# Patient Record
Sex: Male | Born: 1950 | Race: White | Hispanic: No | Marital: Married | Smoking: Never smoker
Health system: Southern US, Community
[De-identification: ages and names within clinical notes are randomized; demographics above are authoritative.]

## PROBLEM LIST (undated history)

## (undated) DIAGNOSIS — Z5189 Encounter for other specified aftercare: Secondary | ICD-10-CM

## (undated) DIAGNOSIS — N200 Calculus of kidney: Secondary | ICD-10-CM

## (undated) DIAGNOSIS — I209 Angina pectoris, unspecified: Secondary | ICD-10-CM

## (undated) DIAGNOSIS — I2510 Atherosclerotic heart disease of native coronary artery without angina pectoris: Secondary | ICD-10-CM

## (undated) DIAGNOSIS — R42 Dizziness and giddiness: Secondary | ICD-10-CM

## (undated) DIAGNOSIS — G4733 Obstructive sleep apnea (adult) (pediatric): Principal | ICD-10-CM

## (undated) DIAGNOSIS — K219 Gastro-esophageal reflux disease without esophagitis: Secondary | ICD-10-CM

## (undated) DIAGNOSIS — Z9989 Dependence on other enabling machines and devices: Principal | ICD-10-CM

## (undated) DIAGNOSIS — E785 Hyperlipidemia, unspecified: Secondary | ICD-10-CM

## (undated) DIAGNOSIS — G4730 Sleep apnea, unspecified: Secondary | ICD-10-CM

## (undated) DIAGNOSIS — I10 Essential (primary) hypertension: Secondary | ICD-10-CM

## (undated) DIAGNOSIS — Z95810 Presence of automatic (implantable) cardiac defibrillator: Secondary | ICD-10-CM

## (undated) HISTORY — PX: CHOLECYSTECTOMY: SHX55

## (undated) HISTORY — PX: CORONARY ARTERY BYPASS GRAFT: SHX141

## (undated) HISTORY — DX: Hyperlipidemia, unspecified: E78.5

## (undated) HISTORY — DX: Dizziness and giddiness: R42

## (undated) HISTORY — DX: Calculus of kidney: N20.0

## (undated) HISTORY — DX: Obstructive sleep apnea (adult) (pediatric): G47.33

## (undated) HISTORY — DX: Encounter for other specified aftercare: Z51.89

## (undated) HISTORY — PX: COLONOSCOPY: SHX174

## (undated) HISTORY — DX: Dependence on other enabling machines and devices: Z99.89

## (undated) HISTORY — DX: Gastro-esophageal reflux disease without esophagitis: K21.9

## (undated) HISTORY — DX: Sleep apnea, unspecified: G47.30

## (undated) HISTORY — DX: Atherosclerotic heart disease of native coronary artery without angina pectoris: I25.10

---

## 1997-06-01 ENCOUNTER — Encounter: Admission: RE | Admit: 1997-06-01 | Discharge: 1997-06-01 | Payer: Self-pay | Admitting: Sports Medicine

## 1997-07-03 ENCOUNTER — Encounter: Admission: RE | Admit: 1997-07-03 | Discharge: 1997-07-03 | Payer: Self-pay | Admitting: Family Medicine

## 1998-06-18 ENCOUNTER — Encounter: Admission: RE | Admit: 1998-06-18 | Discharge: 1998-06-18 | Payer: Self-pay | Admitting: Family Medicine

## 1998-06-21 ENCOUNTER — Encounter: Admission: RE | Admit: 1998-06-21 | Discharge: 1998-06-21 | Payer: Self-pay | Admitting: Sports Medicine

## 1998-06-21 ENCOUNTER — Ambulatory Visit (HOSPITAL_COMMUNITY): Admission: RE | Admit: 1998-06-21 | Discharge: 1998-06-21 | Payer: Self-pay | Admitting: Sports Medicine

## 1998-06-24 ENCOUNTER — Observation Stay (HOSPITAL_COMMUNITY): Admission: AD | Admit: 1998-06-24 | Discharge: 1998-06-25 | Payer: Self-pay | Admitting: Interventional Cardiology

## 1998-07-01 ENCOUNTER — Ambulatory Visit (HOSPITAL_COMMUNITY): Admission: RE | Admit: 1998-07-01 | Discharge: 1998-07-01 | Payer: Self-pay | Admitting: Interventional Cardiology

## 1998-07-27 ENCOUNTER — Observation Stay (HOSPITAL_COMMUNITY): Admission: AD | Admit: 1998-07-27 | Discharge: 1998-07-29 | Payer: Self-pay | Admitting: Anesthesiology

## 1998-08-02 ENCOUNTER — Ambulatory Visit (HOSPITAL_COMMUNITY): Admission: RE | Admit: 1998-08-02 | Discharge: 1998-08-02 | Payer: Self-pay

## 1998-09-10 ENCOUNTER — Encounter: Admission: RE | Admit: 1998-09-10 | Discharge: 1998-09-10 | Payer: Self-pay | Admitting: Sports Medicine

## 1998-10-08 ENCOUNTER — Encounter: Admission: RE | Admit: 1998-10-08 | Discharge: 1998-10-08 | Payer: Self-pay | Admitting: Family Medicine

## 1998-11-11 ENCOUNTER — Encounter: Admission: RE | Admit: 1998-11-11 | Discharge: 1998-11-11 | Payer: Self-pay | Admitting: Family Medicine

## 1998-11-18 ENCOUNTER — Encounter: Admission: RE | Admit: 1998-11-18 | Discharge: 1998-11-18 | Payer: Self-pay | Admitting: Family Medicine

## 1998-12-23 ENCOUNTER — Encounter: Admission: RE | Admit: 1998-12-23 | Discharge: 1998-12-23 | Payer: Self-pay | Admitting: Family Medicine

## 1998-12-30 ENCOUNTER — Encounter: Admission: RE | Admit: 1998-12-30 | Discharge: 1998-12-30 | Payer: Self-pay | Admitting: Family Medicine

## 1999-01-13 ENCOUNTER — Encounter: Admission: RE | Admit: 1999-01-13 | Discharge: 1999-01-13 | Payer: Self-pay | Admitting: Family Medicine

## 1999-01-27 ENCOUNTER — Encounter: Admission: RE | Admit: 1999-01-27 | Discharge: 1999-01-27 | Payer: Self-pay | Admitting: Family Medicine

## 1999-02-03 ENCOUNTER — Encounter: Admission: RE | Admit: 1999-02-03 | Discharge: 1999-02-03 | Payer: Self-pay | Admitting: Sports Medicine

## 1999-02-17 ENCOUNTER — Encounter: Admission: RE | Admit: 1999-02-17 | Discharge: 1999-02-17 | Payer: Self-pay | Admitting: Family Medicine

## 1999-03-11 ENCOUNTER — Encounter: Admission: RE | Admit: 1999-03-11 | Discharge: 1999-03-11 | Payer: Self-pay | Admitting: Family Medicine

## 1999-03-31 ENCOUNTER — Encounter: Admission: RE | Admit: 1999-03-31 | Discharge: 1999-03-31 | Payer: Self-pay | Admitting: Family Medicine

## 2000-08-17 ENCOUNTER — Encounter: Admission: RE | Admit: 2000-08-17 | Discharge: 2000-08-17 | Payer: Self-pay | Admitting: Family Medicine

## 2000-09-25 ENCOUNTER — Encounter: Admission: RE | Admit: 2000-09-25 | Discharge: 2000-09-25 | Payer: Self-pay | Admitting: Sports Medicine

## 2000-09-29 ENCOUNTER — Inpatient Hospital Stay (HOSPITAL_COMMUNITY): Admission: RE | Admit: 2000-09-29 | Discharge: 2000-10-07 | Payer: Self-pay | Admitting: Interventional Cardiology

## 2000-09-29 ENCOUNTER — Encounter: Payer: Self-pay | Admitting: Cardiothoracic Surgery

## 2000-10-02 ENCOUNTER — Encounter: Payer: Self-pay | Admitting: Cardiothoracic Surgery

## 2000-10-03 ENCOUNTER — Encounter: Payer: Self-pay | Admitting: Cardiothoracic Surgery

## 2000-10-04 ENCOUNTER — Encounter: Payer: Self-pay | Admitting: Cardiothoracic Surgery

## 2000-10-05 ENCOUNTER — Encounter: Payer: Self-pay | Admitting: Cardiothoracic Surgery

## 2002-04-22 ENCOUNTER — Encounter: Admission: RE | Admit: 2002-04-22 | Discharge: 2002-04-22 | Payer: Self-pay | Admitting: Sports Medicine

## 2002-05-01 ENCOUNTER — Encounter: Admission: RE | Admit: 2002-05-01 | Discharge: 2002-05-01 | Payer: Self-pay | Admitting: Sports Medicine

## 2002-05-01 ENCOUNTER — Encounter: Payer: Self-pay | Admitting: Sports Medicine

## 2002-05-06 ENCOUNTER — Encounter: Admission: RE | Admit: 2002-05-06 | Discharge: 2002-05-06 | Payer: Self-pay | Admitting: Sports Medicine

## 2006-03-08 DIAGNOSIS — J309 Allergic rhinitis, unspecified: Secondary | ICD-10-CM | POA: Insufficient documentation

## 2006-03-08 DIAGNOSIS — I10 Essential (primary) hypertension: Secondary | ICD-10-CM

## 2006-03-08 DIAGNOSIS — L2089 Other atopic dermatitis: Secondary | ICD-10-CM

## 2006-03-08 DIAGNOSIS — K210 Gastro-esophageal reflux disease with esophagitis: Secondary | ICD-10-CM

## 2006-03-08 DIAGNOSIS — I25709 Atherosclerosis of coronary artery bypass graft(s), unspecified, with unspecified angina pectoris: Secondary | ICD-10-CM | POA: Insufficient documentation

## 2006-08-06 ENCOUNTER — Ambulatory Visit: Payer: Self-pay | Admitting: Gastroenterology

## 2006-08-08 ENCOUNTER — Encounter: Payer: Self-pay | Admitting: Sports Medicine

## 2006-09-21 ENCOUNTER — Ambulatory Visit: Payer: Self-pay | Admitting: Gastroenterology

## 2007-04-23 DIAGNOSIS — E785 Hyperlipidemia, unspecified: Secondary | ICD-10-CM

## 2007-07-22 ENCOUNTER — Encounter: Payer: Self-pay | Admitting: Sports Medicine

## 2007-07-23 ENCOUNTER — Ambulatory Visit: Payer: Self-pay | Admitting: Gastroenterology

## 2007-08-01 ENCOUNTER — Encounter: Payer: Self-pay | Admitting: Sports Medicine

## 2007-08-06 ENCOUNTER — Ambulatory Visit: Payer: Self-pay | Admitting: Gastroenterology

## 2009-05-19 ENCOUNTER — Encounter: Payer: Self-pay | Admitting: Physician Assistant

## 2009-06-10 ENCOUNTER — Ambulatory Visit (HOSPITAL_COMMUNITY): Admission: RE | Admit: 2009-06-10 | Discharge: 2009-06-11 | Payer: Self-pay | Admitting: Interventional Cardiology

## 2009-10-14 ENCOUNTER — Ambulatory Visit: Payer: Self-pay | Admitting: Sports Medicine

## 2009-10-15 ENCOUNTER — Encounter (INDEPENDENT_AMBULATORY_CARE_PROVIDER_SITE_OTHER): Payer: Self-pay | Admitting: *Deleted

## 2009-10-21 ENCOUNTER — Telehealth: Payer: Self-pay | Admitting: Gastroenterology

## 2009-10-21 ENCOUNTER — Encounter (INDEPENDENT_AMBULATORY_CARE_PROVIDER_SITE_OTHER): Payer: Self-pay | Admitting: *Deleted

## 2009-10-25 ENCOUNTER — Ambulatory Visit: Payer: Self-pay | Admitting: Gastroenterology

## 2009-10-25 DIAGNOSIS — K8020 Calculus of gallbladder without cholecystitis without obstruction: Secondary | ICD-10-CM | POA: Insufficient documentation

## 2009-10-25 DIAGNOSIS — K219 Gastro-esophageal reflux disease without esophagitis: Secondary | ICD-10-CM

## 2009-10-25 DIAGNOSIS — R1013 Epigastric pain: Secondary | ICD-10-CM

## 2009-11-08 ENCOUNTER — Ambulatory Visit (HOSPITAL_COMMUNITY): Admission: RE | Admit: 2009-11-08 | Discharge: 2009-11-08 | Payer: Self-pay | Admitting: Gastroenterology

## 2009-11-11 ENCOUNTER — Telehealth (INDEPENDENT_AMBULATORY_CARE_PROVIDER_SITE_OTHER): Payer: Self-pay | Admitting: *Deleted

## 2009-11-11 ENCOUNTER — Encounter: Payer: Self-pay | Admitting: Gastroenterology

## 2009-11-30 ENCOUNTER — Ambulatory Visit (HOSPITAL_COMMUNITY): Admission: RE | Admit: 2009-11-30 | Discharge: 2009-11-30 | Payer: Self-pay | Admitting: General Surgery

## 2009-12-14 ENCOUNTER — Encounter: Payer: Self-pay | Admitting: Gastroenterology

## 2009-12-23 ENCOUNTER — Telehealth: Payer: Self-pay | Admitting: Gastroenterology

## 2010-01-10 ENCOUNTER — Inpatient Hospital Stay (HOSPITAL_COMMUNITY)
Admission: EM | Admit: 2010-01-10 | Discharge: 2010-01-12 | Payer: Self-pay | Source: Home / Self Care | Attending: Interventional Cardiology | Admitting: Interventional Cardiology

## 2010-01-12 LAB — BASIC METABOLIC PANEL
BUN: 13 mg/dL (ref 6–23)
CO2: 27 mEq/L (ref 19–32)
Calcium: 8.8 mg/dL (ref 8.4–10.5)
Chloride: 107 mEq/L (ref 96–112)
Creatinine, Ser: 0.88 mg/dL (ref 0.4–1.5)
GFR calc Af Amer: >60 mL/min (ref >60)
GFR calc non Af Amer: >60 mL/min (ref >60)
Glucose, Bld: 111 mg/dL — ABNORMAL HIGH (ref 70–99)
Potassium: 3.7 mEq/L (ref 3.5–5.1)
Sodium: 140 mEq/L (ref 135–145)

## 2010-01-12 LAB — CBC
HCT: 40.5 % (ref 39.0–52.0)
Hemoglobin: 13.8 g/dL (ref 13.0–17.0)
MCH: 30.1 pg (ref 26.0–34.0)
MCHC: 34.1 g/dL (ref 30.0–36.0)
MCV: 88.4 fL (ref 78.0–100.0)
Platelets: 286 10*3/uL (ref 150–400)
RBC: 4.58 MIL/uL (ref 4.22–5.81)
RDW: 12.4 % (ref 11.5–15.5)
WBC: 8 10*3/uL (ref 4.0–10.5)

## 2010-01-30 ENCOUNTER — Encounter: Payer: Self-pay | Admitting: Family Medicine

## 2010-02-08 NOTE — Miscellaneous (Signed)
Summary: Orders -[facility] Referral  Clinical Lists Changes  Orders: Added new Test order of [facility] ([facility]) - Signed

## 2010-02-08 NOTE — Progress Notes (Signed)
Summary: Appt with [facility]  Phone Note Outgoing Call   Call placed by: Joselyn Glassman,  November 11, 2009 8:56 AM Call placed to: Specialist Summary of Call: Spoke to [place] and made appt for pt with Dr. Bertram Savin on 11-22-09. Faxed records from [place], Office notes, Hida scan, insurance cards , Ultra sound from [facility], [place], [place].  Pt informed of the appt.  Initial call taken by: Joselyn Glassman,  November 11, 2009 8:58 AM

## 2010-02-08 NOTE — Miscellaneous (Signed)
Summary: ETT appt  Clinical Lists Changes  Orders: Added new Test order of ETT (ETT) - Signed     Appt scheduled 11/16/09 @ [facility] @ 1pm. Appt info mailed to pt.  Rochele Pages RN  October 15, 2009 12:27 PM

## 2010-02-08 NOTE — Assessment & Plan Note (Signed)
Summary: F/U,[facility]   Vital Signs:  Patient profile:   60 year old male Height:      73 inches Weight:      170 pounds BMI:     22.51 BP sitting:   147 / 91  Vitals Entered By: Lillia Pauls CMA (October 14, 2009 10:40 AM)  History of Present Illness: June 2 [place] had repeat stenting of 2 areas that appeared to be successful  first CAD Tx in 1999 with stents Had CABG in 2002  Has run successfully since 2002 surgery  Last year 4 half marathons and full [place] marathon 40 to 47 MPW training Last ran half marathon first of May before stents on June 2  Later this May had global feeling that things weren't right This is the way his CAD has presented more than specific chest pain No classic angina sxs that led to cath on June 2  after Cath started back running June 5 after 2 weeks from stents started at 30 to 40 mpw slow runs now trains more slowly  Now has done 3 to 4 runs more than 2 hours HR will increase 5 hours after these to 110 to 115 range Hydrates well  No real abnormal SOB during running and with slow running really feels nothing No chest pain with running no leg cramps  [place] cause HR to jump to 150  Now with running 10 mins miles HR runs in 130s  Meds: Has only taken NTG twice - once in 2002 and once this past june  Allergies (verified): No Known Drug Allergies  Past History:  Past Medical History: lt ITB, lt morton`s neuroma and metatarsalgia  Uses Statins for hyperlipidemia Last panel 2011 TC 171 TG 131  HDL 50 LDL 96  ratio is 3.4  Physical Exam  General:  Well-developed,well-nourished,in no acute distress; alert,appropriate and cooperative throughout examination Neck:  No deformities, masses, or tenderness noted.  no bruits Lungs:  Normal respiratory effort, chest expands symmetrically. Lungs are clear to auscultation, no crackles or wheezes. Heart:  Normal rate and regular rhythm. S1 and S2 normal without gallop, murmur, click, rub or other  extra sounds.  rate 60 Extremities:  no edema   Impression & Recommendations:  Problem # 1:  CORONARY, ARTERIOSCLEROSIS (ICD-414.00) we need a funcitonal plan to discuss dealing with his exercise within safe guidelines  D/W dr Garnette Scheuermann I will do a running ETT for 30 mins at mod effort and check for avg HR and any signs of ischemia or arrythmia If this goes well we will allow training to level estimated by testing  We both suspect EF is better post stent and this will be a good fxnal estimate  Problem # 2:  HYPERLIPIDEMIA (ICD-272.4) Will leave on low dose statin  However will discuss Pritikin and Ornish diet plans as an option since he has so many side-effect issues with statins

## 2010-02-08 NOTE — Assessment & Plan Note (Signed)
Summary: abd pain, gallstones  on [place] /pl             Tommy [name]   History of Present Illness Visit Type: Initial Visit Primary GI MD: Tommy Bunting MD Primary Provider: Enid Baas, MD Chief Complaint: abdominal pain that radiate to bcak , gallstones x 2 months History of Present Illness:   VERY NICE 60 YO MALE KNOWN TO DR. Christella [name] FROM PRIOR COLONOSCOPY 7/09 -NORMAL. [name] WITH HX OF CAD, HE IS S/P CABG AND HAS MULTIPLE STENTS-MOST RECENT 6/11 AT WHICH TIME PLAVIX STARTED. ALSO WITH HX URETEROLITHIASIS. HE HAD ACUTE LEFT SIDED ABDOMINAL PAIN IN 3/11 WHILE ON VACATION IN [place] HAD AN ULTRASOUND  WHICH APPARENTLY ALSO SHOWED GALLSTONES. Marland Kitchen HE REPORTS HAVING EPIGASTRIC DISCOMFORT SINCE HE HAD THE STENTS IN [place]. THIS HAS BEEN INTERMITTENT, MILD, AND RADIATES BETWEEN HIS SHOULDER BLADES. NO N/V. APPETITE FINE, NO WORSE WITH by mouth INTAKE. SOME EPISODES OF GAS , BLOATING,WITH BURNING, AND PRESSURE LIKE PAIN. HE HAS CHRONIC HEARTBURN SXS-SAYS THIS IS DIFFERENT. HE HAD BEEN ON PRILOSEC LONG TERM,WAS SWITCHED TO PROTONIX  WHEN PUT ON PLAVIX-SAYS IT DOESN'T WORK AS WELL.  HE ALSO HAD AN US DONE AT HIS UROLOGIST  WITHIN THE PAST FEW MONTHS AND GALLSTONES MENTIONED AGAIN. HE  THINKS HIS CURRENT PAIN MAY BE RELATED.   GI Review of Systems    Reports abdominal pain, acid reflux, bloating, and  chest pain.     Location of  Abdominal pain: epigastric area.    Denies belching, dysphagia with liquids, dysphagia with solids, heartburn, loss of appetite, nausea, vomiting, vomiting blood, weight loss, and  weight gain.        Denies anal fissure, black tarry stools, change in bowel habit, constipation, diarrhea, diverticulosis, fecal incontinence, heme positive stool, hemorrhoids, irritable bowel syndrome, jaundice, light color stool, liver problems, rectal bleeding, and  rectal pain. Preventive Screening-Counseling & Management  Alcohol-Tobacco     Smoking Status: never      Drug Use:  no.     Current  Medications (verified): 1)  Pantoprazole Sodium 40 Mg Tbec (Pantoprazole Sodium) .... Once Daily 2)  Aspirin 325 Mg Tabs (Aspirin) .... Once Daily 3)  Metoprolol Tartrate 25 Mg Tabs (Metoprolol Tartrate) .... Once Daily 4)  Plavix 75 Mg Tabs (Clopidogrel Bisulfate) .... Once Daily 5)  Lipitor 10 Mg Tabs (Atorvastatin Calcium) .... Take 1/2 Tablet By Mouth Daily 6)  Nitrostat 0.4 Mg Subl (Nitroglycerin) .... As Needed  Allergies (verified): No Known Drug Allergies  Past History:  Past Medical History: morton`s neuroma and metatarsalgia CORONARY DISEASE-S/P CABG, S/P MULTIPLE STENTS-MOST RECENT 6/11(Tommy [name]) Uses Statins for hyperlipidemia Last panel 2011 TC 171 TG 131  HDL 50 LDL 96  ratio is 3.4 Kidney Stones  Past Surgical History: ETT - 06/10/1998, lt ITB injection - 10/09/1996, PTCA & stent - 06/10/1998 CABG-10/02 COLONOSCOPY 7/09 NORMAL   Family History: Family History of Heart Disease:  FATHER WITH COLON CANCER  Social History: patient is a Administrator, sports runner; non smoker/ minimal etoh Occupation:  Patient has never smoked.  Alcohol Use - no Daily Caffeine Use Illicit Drug Use - no Smoking Status:  never Drug Use:  no  Review of Systems  The patient denies allergy/sinus, anemia, anxiety-new, arthritis/joint pain, back pain, blood in urine, breast changes/lumps, change in vision, confusion, cough, coughing up blood, depression-new, fainting, fatigue, fever, headaches-new, hearing problems, heart murmur, heart rhythm changes, itching, menstrual pain, muscle pains/cramps, night sweats, nosebleeds, pregnancy symptoms, shortness of breath, skin rash, sleeping  problems, sore throat, swelling of feet/legs, swollen lymph glands, thirst - excessive , urination - excessive , urination changes/pain, urine leakage, vision changes, and voice change.         SEE HPI  Vital Signs:  Patient profile:   60 year old male Height:      74 inches Weight:      174.38  pounds BMI:     22.47 Pulse rate:   76 / minute Pulse rhythm:   regular BP sitting:   106 / 78  (left arm) Cuff size:   regular  Vitals Entered By: June McMurray CMA [name]) (October 25, 2009 1:25 PM)  Physical Exam  General:  Well developed, well nourished, no acute distress.,THIN Head:  Normocephalic and atraumatic. Eyes:  PERRLA, no icterus. Lungs:  Clear throughout to auscultation. Heart:  Regular rate and rhythm; no murmurs, rubs,  or bruits. Abdomen:  SOFT, NONTENDER, NO MASS OR HSM,BS+ Rectal:  NOT DONE Extremities:  No clubbing, cyanosis, edema or deformities noted. Neurologic:  Alert and  oriented x4;  grossly normal neurologically. Psych:  Alert and cooperative. Normal mood and affect.   Impression & Recommendations:  Problem # 1:  EPIGASTRIC PAIN (ICD-789.06) Assessment New 60 YO MALE WITH CAD. NEW C/O INTERMITTENT EPIGASTRIC PAIN RADIATING TO SHOULDER BLADES X 4 MONTHS. R/O SYMPTOMATIC CHOLELITHIASIS, R/O GASTRITIS/DYSPEPSIA ASSOCIATED WITH PLAVIX AND CHANGE OF PPI. CONTINUE PROTONIX 40 MG DAILY IN AM, ADVISED TO TAKE HIS PLAVIX IN THE EVENING  ADD TRIAL OF OTC PEPCID IN PM SCHEDULE FOR [facility] HIDA SCAN IF HIDA NEGATIVE, AND PAIN PERSISTING-THEN WILL NEED EGD WITH DR. Christella [name] Orders: HIDA CCK (HIDA CCK)  Problem # 2:  GERD (ICD-530.81) Assessment: Comment Only CHRONIC -SEE ABOVE  Problem # 3:  FAMILY HX COLON CANCER (ICD-V16.0) Assessment: Comment Only NORMAL COLON 2009- FOLLOW UP 2014  Problem # 4:  CORONARY, ARTERIOSCLEROSIS (ICD-414.00) Assessment: Comment Only S/P CABG,S/P 9 STENTS-ON PLAVIX  Patient Instructions: 1)  We schedueld the [facility] Hida scan on 11-08-09. 2)  Directions provided. 3)  Copy sent to : Tommy Baas, MD 4)  The medication list was reviewed and reconciled.  All changed / newly prescribed medications were explained.  A complete medication list was provided to the patient / caregiver.

## 2010-02-08 NOTE — Progress Notes (Signed)
Summary: triage  Phone Note Call from Patient Call back at [phone]  (cell)   Caller: Patient Call For: Dr. Christella Hartigan Reason for Call: Talk to Nurse Summary of Call: pt recently had [place] via urologist for kidney stones... was told by urology that he had gall stones as well and needs to follow up with his GI... pt says he is experiencing discomfort from gall stones... happy to bring [place] report from urologist for Dr. Larae Grooms review... ok to see Amy or Gunnar Fusi if necessary Initial call taken by: Vallarie Mare,  October 21, 2009 12:33 PM  Follow-up for Phone Call        pt scheduled with [facility] 10/25/09 at 130 pm Patty Lewis CMA (AAMA)  October 21, 2009 1:50 PM

## 2010-02-08 NOTE — Letter (Signed)
Summary: New Patient letter  [facility]  [address] [place], [place] [address]   Phone: [phone]  Fax: [phone]       10/21/2009 MRN: [id number]  Tommy [name] [address] [place], [place]  [address]  Dear Tommy [name],  Welcome to the Gastroenterology Division at [facility].    You are scheduled to see Tommy Gip PA on 10/25/09  at 1:30 pm  on the 3rd floor at [facility], [address].  We ask that you try to arrive at our office 15 minutes prior to your appointment time to allow for check-in.  We would like you to complete the enclosed self-administered evaluation form prior to your visit and bring it with you on the day of your appointment.  We will review it with you.  Also, please bring a complete list of all your medications or, if you prefer, bring the medication bottles and we will list them.  Please bring your insurance card so that we may make a copy of it.  If your insurance requires a referral to see a specialist, please bring your referral form from your primary care physician.  Co-payments are due at the time of your visit and may be paid by cash, check or credit card.     Your office visit will consist of a consult with your physician (includes a physical exam), any laboratory testing he/she may order, scheduling of any necessary diagnostic testing (e.g. x-ray, ultrasound, CT-scan), and scheduling of a procedure (e.g. Endoscopy, Colonoscopy) if required.  Please allow enough time on your schedule to allow for any/all of these possibilities.    If you cannot keep your appointment, please call [phone] to cancel or reschedule prior to your appointment date.  This allows [place] the opportunity to schedule an appointment for another patient in need of care.  If you do not cancel or reschedule by 5 p.m. the business day prior to your appointment date, you will be charged a $50.00 late cancellation/no-show fee.    Thank you for  choosing [facility] for your medical needs.  We appreciate the opportunity to care for you.  Please visit [place] at our website  to learn more about our practice.                     Sincerely,                                                             The Gastroenterology Division   Appended Document: New Patient letter letter mailed

## 2010-02-10 NOTE — Progress Notes (Signed)
Summary: Appts  Phone Note Call from Patient Call back at [phone]   Caller: Patient Call For: Mike Gip Reason for Call: Talk to Nurse Summary of Call: Pt had gallbladder removed but is still having problems, is requesting to see Amy again Initial call taken by: [place] Johnson,  December 23, 2009 3:05 PM  Follow-up for Phone Call        left message on machine to call back Chales Abrahams CMA [name])  December 24, 2009 1:57 PM   Additional Follow-up for Phone Call Additional follow up Details #1::        pt had gallbladder surgery on 12/01/09 since surgery he is having alot of heartburn and gas.  Had f/u with Dr Freida Busman first of Dec and she advised to call Dr Christella Hartigan office.  He was taking prilosec OTC and it helped Dr Katrinka Blazing (cardiologist) changed him to pantoprazole because of adding Plavix.  He wants to switch back to Prilosec otc is this ok? Additional Follow-up by: Chales Abrahams CMA [name]),  December 24, 2009 2:08 PM    Additional Follow-up for Phone Call Additional follow up Details #2::    yes, that is ok Follow-up by: Rachael Fee MD,  December 24, 2009 2:22 PM  Additional Follow-up for Phone Call Additional follow up Details #3:: Details for Additional Follow-up Action Taken: pt aware he will call with further complaints Additional Follow-up by: Chales Abrahams CMA [name]),  December 24, 2009 2:25 PM

## 2010-02-10 NOTE — Letter (Signed)
Summary: [facility]   Imported By: Sherian Rein 01/26/2010 14:52:41  _____________________________________________________________________  External Attachment:    Type:   Image     Comment:   External Document

## 2010-03-21 LAB — CBC
HCT: 37.9 % — ABNORMAL LOW (ref 39.0–52.0)
HCT: 42.6 % (ref 39.0–52.0)
Hemoglobin: 13.1 g/dL (ref 13.0–17.0)
Hemoglobin: 14.8 g/dL (ref 13.0–17.0)
MCH: 30.2 pg (ref 26.0–34.0)
MCH: 30.4 pg (ref 26.0–34.0)
MCHC: 34.6 g/dL (ref 30.0–36.0)
MCHC: 34.7 g/dL (ref 30.0–36.0)
MCV: 87.3 fL (ref 78.0–100.0)
MCV: 87.5 fL (ref 78.0–100.0)
Platelets: 274 10*3/uL (ref 150–400)
Platelets: 302 10*3/uL (ref 150–400)
RBC: 4.34 MIL/uL (ref 4.22–5.81)
RBC: 4.87 MIL/uL (ref 4.22–5.81)
RDW: 12.2 % (ref 11.5–15.5)
RDW: 12.3 % (ref 11.5–15.5)
WBC: 6.6 10*3/uL (ref 4.0–10.5)
WBC: 7 10*3/uL (ref 4.0–10.5)

## 2010-03-21 LAB — POCT CARDIAC MARKERS
CKMB, poc: <1 ng/mL — ABNORMAL LOW (ref 1.0–8.0)
Myoglobin, poc: 67.4 ng/mL (ref 12–200)
Troponin i, poc: <0.05 ng/mL (ref 0.00–0.09)

## 2010-03-21 LAB — DIFFERENTIAL
Basophils Absolute: 0 10*3/uL (ref 0.0–0.1)
Basophils Relative: 1 % (ref 0–1)
Eosinophils Absolute: 0.1 10*3/uL (ref 0.0–0.7)
Eosinophils Relative: 1 % (ref 0–5)
Lymphocytes Relative: 24 % (ref 12–46)
Lymphs Abs: 1.7 10*3/uL (ref 0.7–4.0)
Monocytes Absolute: 0.6 10*3/uL (ref 0.1–1.0)
Monocytes Relative: 8 % (ref 3–12)
Neutro Abs: 4.7 10*3/uL (ref 1.7–7.7)
Neutrophils Relative %: 66 % (ref 43–77)

## 2010-03-21 LAB — CARDIAC PANEL(CRET KIN+CKTOT+MB+TROPI)
CK, MB: 1.1 ng/mL (ref 0.3–4.0)
CK, MB: 1.2 ng/mL (ref 0.3–4.0)
Relative Index: INVALID (ref 0.0–2.5)
Relative Index: INVALID (ref 0.0–2.5)
Total CK: 64 U/L (ref 7–232)
Total CK: 75 U/L (ref 7–232)
Troponin I: 0.02 ng/mL (ref 0.00–0.06)
Troponin I: 0.02 ng/mL (ref 0.00–0.06)

## 2010-03-21 LAB — BASIC METABOLIC PANEL
BUN: 17 mg/dL (ref 6–23)
CO2: 27 mEq/L (ref 19–32)
Calcium: 9.9 mg/dL (ref 8.4–10.5)
Chloride: 107 mEq/L (ref 96–112)
Creatinine, Ser: 1.02 mg/dL (ref 0.4–1.5)
GFR calc Af Amer: >60 mL/min (ref >60)
GFR calc non Af Amer: >60 mL/min (ref >60)
Glucose, Bld: 108 mg/dL — ABNORMAL HIGH (ref 70–99)
Potassium: 3.8 mEq/L (ref 3.5–5.1)
Sodium: 142 mEq/L (ref 135–145)

## 2010-03-21 LAB — HEPARIN LEVEL (UNFRACTIONATED)
Heparin Unfractionated: <0.1 IU/mL — ABNORMAL LOW (ref 0.30–0.70)
Heparin Unfractionated: <0.1 IU/mL — ABNORMAL LOW (ref 0.30–0.70)

## 2010-03-21 LAB — CK TOTAL AND CKMB (NOT AT ARMC)
CK, MB: 1.3 ng/mL (ref 0.3–4.0)
Relative Index: INVALID (ref 0.0–2.5)
Total CK: 70 U/L (ref 7–232)

## 2010-03-21 LAB — LIPID PANEL
Cholesterol: 182 mg/dL (ref 0–200)
HDL: 39 mg/dL — ABNORMAL LOW (ref >39)
Total CHOL/HDL Ratio: 4.7 RATIO
Triglycerides: 172 mg/dL — ABNORMAL HIGH (ref <150)

## 2010-03-21 LAB — PLATELET INHIBITION P2Y12: Platelet Function  P2Y12: 5 [PRU] — ABNORMAL LOW (ref 194–418)

## 2010-03-21 LAB — PROTIME-INR
INR: 0.93 (ref 0.00–1.49)
Prothrombin Time: 12.7 seconds (ref 11.6–15.2)

## 2010-03-21 LAB — APTT: aPTT: 27 seconds (ref 24–37)

## 2010-03-21 LAB — HEMOGLOBIN A1C
Hgb A1c MFr Bld: 5.6 % (ref <5.7)
Mean Plasma Glucose: 114 mg/dL (ref <117)

## 2010-03-21 LAB — TROPONIN I: Troponin I: 0.03 ng/mL (ref 0.00–0.06)

## 2010-03-22 LAB — CBC
HCT: 39.6 % (ref 39.0–52.0)
Hemoglobin: 14 g/dL (ref 13.0–17.0)
MCHC: 35.4 g/dL (ref 30.0–36.0)
MCV: 87.4 fL (ref 78.0–100.0)
Platelets: 273 10*3/uL (ref 150–400)
RBC: 4.53 MIL/uL (ref 4.22–5.81)
WBC: 6.4 10*3/uL (ref 4.0–10.5)

## 2010-03-22 LAB — COMPREHENSIVE METABOLIC PANEL
ALT: 30 U/L (ref 0–53)
AST: 23 U/L (ref 0–37)
Albumin: 4.5 g/dL (ref 3.5–5.2)
Calcium: 9.5 mg/dL (ref 8.4–10.5)
Creatinine, Ser: 0.9 mg/dL (ref 0.4–1.5)
GFR calc Af Amer: >60 mL/min (ref >60)
Sodium: 136 mEq/L (ref 135–145)
Total Protein: 6.8 g/dL (ref 6.0–8.3)

## 2010-03-28 LAB — CBC
HCT: 42.6 % (ref 39.0–52.0)
MCHC: 34.4 g/dL (ref 30.0–36.0)
MCV: 93.7 fL (ref 78.0–100.0)
Platelets: 266 10*3/uL (ref 150–400)
RDW: 12.6 % (ref 11.5–15.5)
WBC: 10.3 10*3/uL (ref 4.0–10.5)

## 2010-03-28 LAB — BASIC METABOLIC PANEL
BUN: 13 mg/dL (ref 6–23)
CO2: 28 mEq/L (ref 19–32)
Chloride: 103 mEq/L (ref 96–112)
Glucose, Bld: 111 mg/dL — ABNORMAL HIGH (ref 70–99)
Potassium: 4.1 mEq/L (ref 3.5–5.1)

## 2010-05-24 NOTE — Assessment & Plan Note (Signed)
[facility]                         GASTROENTEROLOGY OFFICE NOTE   COBY, SHREWSBERRY                      MRN:          [id number]  DATE:08/06/2006                            DOB:          [birth year]    REFERRING PHYSICIAN:  Fredia Beets, MD   PRIMARY CARE PHYSICIAN:  Sibyl Parr. Darrick Penna, M.D.   REASON FOR REFERRAL:  Mr. Lendon Colonel asked me to evaluate Mr. Hewes in  consultation regarding abnormal CT colography.   HISTORY OF PRESENT ILLNESS:  Mr. Paskett is a very pleasant 60 year old  man who has had multiple flex-sig over the past several years.  He  believes his last one was 3-4 years ago by Dr. Lendon Colonel.  He recently had a  CT virtual colonoscopy that suggested an area of focal narrowing in  the sigmoid colon, presented to Dr. Lendon Colonel to consider further testing,  and Dr. Lendon Colonel recommended a full colonoscopy.  I believe he is here for  a second opinion on this.  He has no troubles with constipation or  diarrhea.  He does have mild intermittent rectal bleeding which he  attributes to hemorrhoids.   REVIEW OF SYSTEMS:  Essentially negative and is available on the nursing  intake sheet.   PAST MEDICAL HISTORY:  1. Coronary artery disease with a stent in 2001.  2. Coronary artery bypass in 2002.  3. Hypertension .  4. Elevated  cholesterol.   CURRENT MEDICATIONS:  Altace, metoprolol, Welchol, fiber, folic acid,  psyllium, potassium, magnesium, aspirin.   ALLERGIES:  STATIN CAUSES SEVERE JOINT PAIN.   SOCIAL HISTORY:  Nonsmoker, drinks a glass of wine a day.   FAMILY HISTORY:  Tells me his father had colon polyps on other  documentation.  I see that Dad had actually a colon cancer.  He was not  exactly clear whether his dad had colon cancer or just polyps in the  office today.   PHYSICAL EXAMINATION:  VITAL SIGNS:  Height 6 feet 1 inches, 178 pounds,  blood pressure 122/72, pulse 72.  CONSTITUTIONAL:  Generally well-appearing, neurologic, alert and  oriented x3.  HEENT:  Eyes:  Extraocular movements intact.  Mouth:  Oral pharynx  moist.  No lesions.  NECK:  Supple, no lymphadenopathy.  CARDIOVASCULAR:  Heart:  Regular rate and rhythm.  LUNGS:  Clear to auscultation bilaterally.  ABDOMEN:  Soft, nontender, nondistended.  Normoactive bowel sounds.  EXTREMITIES:  No lower extremity edema.  SKIN:  No rashes or lesions of his lower extremities.   ASSESSMENT/PLAN:  A 60 year old man with family history of colon polyps  (question father had colon cancer), abnormal sigmoid colon on recent CT  colonography.   I also recommended Mr. Mahabir should have a full colonoscopy.  However,  he is not interested in that, and would prefer instead a flexible  sigmoidoscopy.  I explained to him that a flexible sigmoidoscopy is  tantamount to examining just one breast when screening for breast  cancer.  That being said, he has had a virtual colonoscopy that seems to  have cleared the rest of his colon, although, there are reports of there  being a fair amount of retained stool.  I have also given possible  family history of colon cancer.  I think the right recommendation is a  full colonoscopy.  That being said, I am happy to do whatever he is  willing to do, and  certainly a sigmoidoscopy is better than no exam at  this point.  So, we will arranged for him to have a flexible  sigmoidoscopy at his soonest convenience.     Rachael Fee, MD  Electronically Signed    DPJ/MedQ  DD: 08/06/2006  DT: 08/07/2006  Job #: [id number]   cc:   Sibyl Parr. Darrick Penna, M.D.  Fredia Beets, M.D.

## 2010-05-27 NOTE — Op Note (Signed)
[place]  Patient:    Tommy [name], Tommy [name] Visit Number: [id number] MRN: [id number]          Service Type: MED Location: [id number] [phone] Attending Physician:  Mikey Bussing Dictated by:   Mikey Bussing, M.D. Proc. Date: 10/02/00 Admit Date:  09/28/2000   CC:         Darci Needle, M.D.   Operative Report  PREOPERATIVE DIAGNOSIS:  Class 3 progressive angina with severe three vessel coronary artery disease.  POSTOPERATIVE DIAGNOSIS:  Class 3 progressive angina with severe three vessel coronary artery disease.  OPERATION PERFORMED:  Coronary artery bypass grafting x 4 (left internal mammary artery to left anterior descending, left radial artery graft to posterior descending coronary artery, saphenous vein graft to diagonal, saphenous vein graft to obtuse marginal 2).  SURGEON:  Mikey Bussing, M.D.  ASSISTANT:  Areta Haber, P.A.  ANESTHESIA:  General.  ANESTHESIOLOGIST:  Burna Forts, M.D.  INDICATIONS FOR PROCEDURE:  The patient is a 60 year old male who presented with a known coronary disease status post stenting and angioplasties.  He had symptoms of exertional angina and a positive stress test.  Cardiac catheterization demonstrated a high grade ulcerated plaque in the proximal LAD diagonal and an 80% stenosis of the large posterior descending.  He had a 70 to 80% stenosis of the smaller OM2 and was referred for surgical coronary revascularization.  Prior to surgery, I examined the patient in his hospital room and reviewed the results of the cardiac catheterization with the patient and his wife.  I discussed the indications and expected benefits of coronary artery bypass surgery.  I discussed the major aspects of the operation including location of the surgical incisions, the choice of conduit, the use of general anesthesia and cardiopulmonary bypass and the expected postoperative recovery.  I reviewed to  surgical therapy for treatment of his coronary artery disease.  I reviewed with the patient the risks associated with this operation including the risks of MI, CVA, bleeding, infection, and death.  He understood the implications for surgery and agreed to proceed with the operation as planned under what I felt was informed consent.  OPERATIVE FINDINGS:  The left mammary artery was somewhat small and tended to have spasm which was treated with papaverine.  The radial artery was a larger vessel of excellent quality.  The saphenous vein was of average quality and was taken from the left leg.  He has had right leg vein stripping.  The LAD had diffuse disease and thickening.  The posterior descending had diffuse disease and thickening and both arterial grafts were placed fairly distally on the two vessels.  The OM2 was small but just graftable.  The diagonal was a very thin vessel with some degenerative changes of the wall of the vessel. The myocardium was without evidence of scarring or fibrosis.  The ascending aorta was somewhat short.  DESCRIPTION OF PROCEDURE:  The patient was brought to the operating room and placed supine on the operating table where general anesthesia was induced under invasive hemodynamic monitoring.  The chest, abdomen, legs and left arm were prepped with Betadine and draped as a sterile field.  The left radial artery was harvested as a free graft from the left arm.  Prior to removing the vessel, the patient was given heparin and the vessel was flushed with papaverine.  The left radial pulse was documented after the radial artery was clamped with a  pulse present at the wrist.  The left internal mammary artery was harvested as a pedicle graft from its origin at the subclavian vessels. It was small but without adequate flow and did show some tendency towards spasm.  The saphenous vein from the left leg was of average quality.  The sternal retractor was placed and heparin  was given and the ACT was therapeutic.  Pursestrings were placed in the ascending aorta and the right atrium and the patient was cannulated and placed on bypass.  The coronaries were identified, grafting in the mammary artery, radial artery and vein grafts were prepared for the distal anastomoses.  A cardioplegia cannula was placed and the patient was cooled to 28 degrees.  As the aortic crossclamp was applied 550 cc of cold blood cardioplegia was delivered to the aortic root with immediate cardioplegic arrest and septal temperature dropping to less than 14 degrees.  Topical iced saline slush was used to augment myocardial preservation and a pericardial insulator pad was used to protect the left phrenic nerve.  The distal coronary anastomoses were then performed.  The first distal anastomosis was to the diagonal.  This was a 1.7 mm vessel.  An arteriotomy was made and the vessel was very thin with degenerative changes and the intima dissected off the adventitia.  The anastomosis was first attempted by occluding the intimal dissection in the anastomosis to repair it; however, the intima continued to tear and so the vein graft was removed and the intima was tacked back up to the vessel and the vessel was closed.  A new arteriotomy was made more distally and in this portion the vessel was in better condition and the same vein was used to create a reversed saphenous vein graft using running 7-0 Prolene and there was good flow through the graft.  A second distal anastomosis was to the OM2.  This was a small 1.0 mm vessel on the posterolateral wall of the left ventricle.  A reversed saphenous vein was sewn end-to-side with running 7-0 Prolene and there was good flow through the graft.  Cardioplegia was redosed.  The third distal anastomosis was to the posterior descending.  This was very thickened sclerotic vessel for a good distance and the radial artery length required was most of its length  because we placed the graft fairly distal down the posterior descending.  It admitted  a 1.5 to 2 mm probe and the radial artery was sewn end-to-side with a running 8-0 Prolene and there was good flow through the graft.  The fourth distal anastomosis was to the LAD.  The LAD had some diffuse distal disease after its midpoint and the mammary pedicle was brought through an opening in the lateral pericardium.  An arteriotomy was made in the LAD and this was just distal to the midpoint of the vessel before the vessel became diffusely diseased.  An end-to-side anastomosis with running 8-0 Prolene was created and there was good flow through the graft with immediate rise in septal temperature after release of the clamp on the mammary artery.  The mammary pedicle was secured to the epicardium and the the aortic crossclamp was removed.  The heart was cardioverted back to a regular rhythm.  The partial occlusion clamp was placed on the ascending aorta and three proximal anastomoses were performed using the 4.0 mm punch and running 6-0 Prolene for the vein and a 3.0 mm punch and running 7-0 Prolene for the free radial artery graft to the right coronary.  The partial clamp was removed and the grafts were perfused. Each had good flow and hemostasis was documented at the proximal and distal sites.  The patient was rewarmed and reperfused and temporary pacing wires were applied.  The lungs were re-expanded and the ventilator was resumed. When the patient reached 37 degrees he was weaned from bypass without difficulty.  He was stable with blood pressure and cardiac output without inotropes.  Blood pressure remained stable and protamine was administered. The cannulas were removed.  The mediastinum was irrigated with warm antibiotic irrigation.  The leg incision was irrigated and closed in a standard fashion. The arm incision was irrigated and closed in a standard fashion.  The pericardium was loosely  reapproximated superiorly over the aorta and right ventricle.  Two mediastinal and a left pleural chest tube were placed and brought out through separate incisions.  The sternum was reapproximated with interrupted steel wire.  The pectoralis fascia was closed with interrupted #1 Vicryl.  The subcutaneous and skin were closed with running Vicryl and sterile dressings were applied.  Total bypass time was 200 minutes.  ADDENDUM:  The patient had diffuse oozing consistent with coagulopathy and preoperative use of Plavix and a dose of platelets and DDAVP was administered with improvement in coagulation function. Dictated by:   Mikey Bussing, M.D. Attending Physician:  Mikey Bussing DD:  10/02/00 TD:  10/02/00 Job: [phone]

## 2010-05-27 NOTE — Discharge Summary (Signed)
[place]  Patient:    HERALD, VALLIN Visit Number: [id number] MRN: [id number]          Service Type: MED Location: 2000 2011 01 Attending Physician:  Mikey Bussing Dictated by:   Tollie Pizza Collins, P.A.-C. Admit Date:  09/28/2000 Discharge Date: 10/07/2000   CC:         Darci Needle, M.D.   Discharge Summary  ADMITTING DIAGNOSES: 1. Severe three-vessel coronary artery disease. 2. Class III exertional angina. 3. Dyslipidemia. 4. Gastroesophageal reflux disease. 5. History of sinusitis. 6. Status post percutaneous transluminal coronary angioplasty in June 2000,    and August 2000.  PROCEDURES: 1. Cardiac catheterization. 2. Coronary artery bypass grafting x 4 (left internal mammary artery to the    left anterior descending, left radial artery to the posterior descending    artery, saphenous vein graft to diagonal, saphenous vein graft to second    obtuse marginal).  HISTORY OF PRESENT ILLNESS:  The patient is a 60 year old male with a known history of coronary artery disease.  He has undergone PTCA both in June and August of 2000.  He has continued to have recurrent angina symptoms with exertion, specifically with running, and it was recommended that he undergo cardiac catheterization at this time.  He was found to have significant two-vessel coronary artery disease and was felt at this time to be a candidate for surgical revascularization.  He was admitted for further evaluation and treatment.  HOSPITAL COURSE:  He was seen in consultation by Dr. Kathlee Nations Trigt and discussion of the risks, benefits and alternatives of the procedure were entertained with the patient and his family.  He consented to proceed with surgery and was taken to the OR on September 24, where he underwent CABG x 4 with the above-noted grafts.  He tolerated the procedure well and was transferred to the ICU in stable condition.  He was extubated  shortly after surgery.  He was noted to be hemodynamically stable on postop day #1.  He remained in ICU for further observation and on postop day #2, was able to be transferred to the floor.  His postoperative course was uneventful.  He did have an episode of nonsustained atrial fibrillation and flutter.  He was started on digoxin and this was titrated up to 0.25 mg per day.  He maintained normal sinus rhythm throughout the remainder of his mission.  He remained afebrile and all vital signs were stable.  His incisions were healing well. He was ambulating in the halls without difficulty.  He was noted to have some decrease in his hemoglobin and hematocrit and did undergo transfusion of packed red blood cells.  This did improve his hemoglobin and hematocrit to 9.4 and 26.8 respectively.  He was also started on iron therapy.  By September 29, it was felt he could be discharged home.  DISCHARGE MEDICATIONS: 1. Prilosec 20 mg q.d. 2. Enteric coated aspirin 325 mg q.d. 3. Digoxin 0.25 mg q.d. 4. Niferex 150 mg b.i.d. 5. Imdur 30 mg p.o. q.d. 6. Lopressor 50 mg b.i.d. 7. Altace 2.5 mg q.d. 8. Tylox one to two q.4h. p.r.n. pain. 9. Multivitamin daily.  ACTIVITY:  He is asked to continue daily walking and deep breathing exercises. He is asked to refrain from driving or heavy lifting over 10 pounds.  DIET:  Continue low fat, low sodium diet.  SPECIAL INSTRUCTIONS:  He is asked to shower daily.  Clean incisions with soap  and water.  FOLLOWUP:  He should make a follow-up appointment in two weeks to see Dr. Katrinka Blazing.  Follow-up appointment was scheduled in one week for staple removal at the [facility] office as well as in three weeks for Dr. Donata Clay for followup. He is asked to call our office if he has any difficulties. Dictated by:   Almira Coaster . Collins, P.A.-C. Attending Physician:  Mikey Bussing DD:  10/16/00 TD:  10/17/00 Job: [phone]

## 2010-05-27 NOTE — Consult Note (Signed)
[place]. [facility]  Patient:    Tommy [name], Tommy [name] Visit Number: [id number] MRN: [id number]          Service Type: CAT Location: 3700 3714 [id number] Attending Physician:  Lyn Records. Iii Dictated by:   Mikey Bussing, M.D. Proc. Date: 09/29/00 Admit Date:  09/28/2000   CC:         Darci Needle, M.D., [facility]  Sibyl Parr. Darrick Penna, M.D.  [facility] Office.   Consultation Report  REASON FOR CONSULTATION:  Severe three-vessel coronary artery disease with class III exertional angina and positive stress test.  REQUESTING PHYSICIAN:  Darci Needle, M.D.  PRIMARY CARE PHYSICIAN:  Royal Hawthorn B. Fields, M.D.  CHIEF COMPLAINT:  Chest pain.  HISTORY OF PRESENT ILLNESS:  I was asked to see Tommy [name] in consultation by Dr. Katrinka Blazing for evaluation of severe three-vessel coronary artery disease and recurrent angina following prior coronary stent and angioplasties.  The patient is a 60 year old male with a history of coronary disease who underwent stenting of the right coronary artery in June 2000 and an angioplasty/atherectomy of the diagonal in July 2000.  At that time, he had nonsignificant right and circumflex disease, and his ejection fraction was 70%.   His last Cardiolite scan was in November 2001 which showed possible mild inferior ischemia with good overall ventricular function.  The patient is a long-distance runner and has noted, over the past two to three weeks, he has had chest pain after exerting himself, and the heart rate accelerated greater than 130 per minute.  This is substernal sharp pain that is relieved by rest.  The pain is not associated with shortness of breath, diaphoresis, nausea.  He has no resting chest pain.  Normal activities do not produce chest pain.  The patient is an avid runner and wishes to continue his running hobby, and he has run marathons.  The patient was admitted for elective cardiac catheterization yesterday  by Dr. Verdis Prime.  This study demonstrated recurrent significant coronary disease with a 95% proximal LAD ulcerated plaque involving a large diagonal, 70% stenosis of the circumflex before the OM-2, and an 80% stenosis of the proximal posterior descending vessel which is a large vessel.  His ejection fraction is still normal, and his left ventricular end-diastolic pressure was normal.   He is referred for surgical coronary revascularization with preference for arterial grafts in this young, active male.  The patients risk factors for coronary disease include mild hyperlipidemia, mild hypertension.  No family history and nonsmoking history.  PAST MEDICAL HISTORY: 1. Coronary artery disease. 2. Dyslipidemia. 3. 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  Both were treated with    compression and resolution of the false aneurysm. 4. History of GERD on Prilosec. 5. History of sinusitis. 6. Mononucleosis in 1970 with mild jaundice.  CURRENT MEDICATIONS: 1. Prilosec 20 mg q.d. 2. Potassium supplements 1 a day. 3. Enteric-coated aspirin 325 mg q.d. 4. Altace 5 mg q.d. 5. He has taken Lipitor in the past.  ALLERGIES:  ADVICOR caused dizziness, vertigo.  SOCIAL HISTORY:  The patient is married to his second wife.  He denies smoking.  He denies significant alcohol intake.  He has a 3 year old son and a 33 year old daughter.  Both are active in dancing.  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  PAST SURGICAL HISTORY:  Right lower leg vein stripping in 1981.  FAMILY HISTORY:  The patients father died at age 45 following surgery for colon cancer.  His mother is alive at age 84 in good health.  He has three older brothers in good health.  REVIEW OF SYSTEMS:  Constitutional review is negative for fever, weight loss, change in  energy, or bowel habits.  Cardiac review is positive for his exercise-induced angina but negative for dyspnea on exertion, orthopnea, or PND.  Pulmonary review is negative for wheezing, productive cough, asthma, or pulmonary infections.  GI review is negative for melena, abdominal pain, or dysphagia.  GU review is negative for nocturia, dysuria, hematuria, or prostatitis.  Vascular review is negative for DVT.  He did have varicose veins, but this was limited to the right leg.  He denies claudication. Neurologic review is negative for TIA, seizure, syncope, or concussion. Hematologic review is negative for bleeding problems other than associated with the two calf procedures in the summer of 2000.  Skin review is negative. Psychologic review is negative for depression.  He is right-hand dominant.  PHYSICAL EXAMINATION:  VITAL SIGNS:  The patient is 6 feet 1 inch and weighs 170 pounds.  His body surface area is 2.0.  Blood pressure 150/80, heart rate 80 per minute, respirations 18, room air saturation 97%.  GENERAL:  Middle-aged, tall, thin white male in his hospital room accompanied by his wife and who is anxious but in no distress but denies chest pain.  HEENT:  Reveals dentition under good repair.  Full EOMs.  Pharynx clear. Normocephalic.  NECK:  Supple without JVD, thyromegaly, or carotid bruit.  PULMONARY:  Clear breath sounds bilaterally and no thoracic deformity.  CARDIAC:  Regular rate and rhythm without S3, gallop, or murmur.  PMI is nondisplaced.  ABDOMEN:  Soft, nontender, without organomegaly tenderness with normal bowel sounds and no abdominal bruit.  EXTREMITIES:  No clubbing, edema, or tenderness.  There is good range of motion of upper and lower extremities.  The groin catheterization site is without hematoma or bleeding.  Joints are without swelling or tenderness.   GU:  Exam deferred.  VASCULAR:  Exam reveals 2+ pulses bilaterally in radial, femoral, and  pedal areas.  The right saphenous vein has been removed surgically.  The left saphenous vein appears to be normal.  SKIN:  Clear, warm, and dry without rash or lesion.  LYMPHATIC:  No palpable adenopathy to the cervical, supraclavicular, or axillary regions.  NEUROLOGIC:  Alert and oriented x 3 with full motor function.  LABORATORY DATA:  His coronary arteriograms were reviewed, and I agree with Dr. Lonn Georgia interpretation of a high-grade proximal LAD diagonal stenosis, a high-grade proximal posterior descending stenosis, and a moderate stenosis of the distal circumflex.  Overall ventricular function is intact.  His vascular lab studies including palmar arch Dopplers are pending.  His laboratory values are within normal limits.  His EKG shows a sinus rhythm with nonspecific ST segment changes.  IMPRESSION:  I would recommend coronary surgical grafting for the best long-term therapy in this patients recurrent coronary artery disease. Arterial grafts to left anterior descending artery, diagonal, and posterior descending would be optimal using the left internal mammary artery and left radial artery.  A vein graft to the circumflex nondominant vessel would also be performed at the same time.  I discussed the plan for this surgery with the patient and his wife.  I discussed the major aspects of the  operation including the location of the surgical incisions, the choice of conduit for bypass grafting, the use of general anesthesia and cardiopulmonary bypass, and expected hospital recovery.  I reviewed the risks of the operation with the patient and his wife including risks of MI, CVA, bleeding, infection, and death.  He understands that there are alternatives to surgical therapy for his coronary disease.  He understands these aspects and agrees to proceed with the operation which will be scheduled for Tuesday, September 24.  Thank you very much for this consultation. Dictated by:   Mikey Bussing, M.D. Attending Physician:  Lyn Records. Iii DD:  09/29/00 TD:  09/29/00 Job: [id number]

## 2010-05-27 NOTE — Cardiovascular Report (Signed)
[place]. [facility]  Patient:    Tommy [name], Tommy [name] Visit Number: [id number] MRN: [id number]          Service Type: CAT Location: [id number] Attending Physician:  Lyn Records. Iii Dictated by:   Darci Needle, M.D. Proc. Date: 09/28/00 Admit Date:  09/28/2000   CC:         Royal Hawthorn B. Darrick Penna, M.D.  Mikey Bussing, M.D.   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  He has a prior history of total occlusion of the right coronary opened with coronary stenting in 2000 and also had rotational atherectomy on the first diagonal in 2000.  PROCEDURES PERFORMED: 1. Left heart catheterization. 2. Selective coronary angioplasty. 3. Left ventriculography.  DESCRIPTION:  After informed consent, the patient was brought to the catheterization lab, and a 6-French sheath was placed in the right femoral artery using the modified Seldinger technique.  A 6-French A2 multipurpose catheter was used for hemodynamic recordings, left ventriculography, and right coronary angiography.  We also used left and right 6-French Judkins catheters for left and right coronary angiography.  The patient tolerated the procedure without significant complications.  RESULTS: I.   Hemodynamic data:      a. Aortic pressure 160/96 mmHg.      b. Left ventricular pressure 160/7 mmHg. II.  Left ventriculography:  The left ventricle is normal in size and      demonstrates overall contractility. The EF is 60+%. III. Selective coronary angiography.      a. Left main coronary:  Left main coronary artery is large.  It         contains no significant obstructive lesions.      b. Left anterior descending coronary: The left anterior descending         coronary artery is large.   It becomes moderately diffusely diseased         beyond the first diagonal.  There is a severe ulcerated plaque in         the proximal LAD before the first diagonal that obstructs the artery         by up to 95%.  The diagonal, which is the site of previous         percutaneous coronary intervention, contains 30 to 40% ostial         narrowing.  The LAD beyond the third septal perforator in the mid         segment contains 50% narrowing, and there is 60 to 70% narrowing in         the distal vessel near the apex in three discrete spots.  The         diagonal, other than in its ostium, is free of any significant         obstruction.      c. Circumflex artery: The circumflex coronary artery is large.  It gives         origin to three obtuse marginal branches.  The first obtuse marginal         is free of any significant obstruction.  There is 20 to 30% narrowing         in the proximal obtuse marginal. The mid circumflex contains diffuse  disease with up to 60% narrowing in multiple spots.  The second and         third obtuse marginal branches are relatively small and free of         significant obstruction.      d. Right coronary:  The right coronary is a large vessel.  There is a         proximal and distal stent.  The stent regions are widely patent.         There are three left ventricular branches and a large PDA inferiorly.         The proximal PDA contains 75% obstruction.  The left ventricular         branches are free of any significant obstruction.  CONCLUSIONS: 1. Severe coronary atherosclerotic heart disease with high-grade ulcerated    plaque in the  proximal left anterior descending artery (LAD) threatening    both the large diagonal territory and the LAD proper.  The LAD beyond the    diagonal is moderately diffusely disease.  The right coronary contains a    severe stenosis in the posterior descending artery.  The posterior    descending artery is relatively large.   Stents in the proximal and mid    right coronary artery are widely patent.  The circumflex contains moderate    diffuse disease in the mid vessel beyond the large first obtuse marginal. 2. Normal left ventricular function.  RECOMMENDATIONS:  Consider arterial bypass grafting to the LAD, diagonal, and saphenous vein grafting to the PDA and circumflex territories versus percutaneous coronary intervention perhaps with [facility] and cutting balloon angioplasty of the LAD-diagonal branch site lesion and stenting of the PDA. Will have Dr. Kathlee Nations Trigt see the patient and give his opinion concerning surgical options.  After this, will have frank discussion with the patient, and we will proceed in a manner that seems most appropriate. Dictated by:   Darci Needle, M.D. Attending Physician:  Lyn Records. Iii DD:  09/28/00 TD:  09/29/00 Job: [id number]

## 2010-10-14 ENCOUNTER — Ambulatory Visit (HOSPITAL_COMMUNITY)
Admission: RE | Admit: 2010-10-14 | Discharge: 2010-10-14 | Disposition: A | Discharge status: Home / Self Care | Payer: PRIVATE HEALTH INSURANCE | Source: Ambulatory Visit | Attending: Interventional Cardiology | Admitting: Interventional Cardiology

## 2010-10-14 DIAGNOSIS — I2510 Atherosclerotic heart disease of native coronary artery without angina pectoris: Secondary | ICD-10-CM | POA: Insufficient documentation

## 2010-10-14 DIAGNOSIS — I209 Angina pectoris, unspecified: Secondary | ICD-10-CM | POA: Insufficient documentation

## 2010-10-14 DIAGNOSIS — I25810 Atherosclerosis of coronary artery bypass graft(s) without angina pectoris: Secondary | ICD-10-CM | POA: Insufficient documentation

## 2010-10-14 DIAGNOSIS — T82897A Other specified complication of cardiac prosthetic devices, implants and grafts, initial encounter: Secondary | ICD-10-CM | POA: Insufficient documentation

## 2010-10-14 DIAGNOSIS — Y831 Surgical operation with implant of artificial internal device as the cause of abnormal reaction of the patient, or of later complication, without mention of misadventure at the time of the procedure: Secondary | ICD-10-CM | POA: Insufficient documentation

## 2010-10-14 DIAGNOSIS — I2582 Chronic total occlusion of coronary artery: Secondary | ICD-10-CM | POA: Insufficient documentation

## 2010-10-20 NOTE — Cardiovascular Report (Signed)
NAME:  Tommy [name], Tommy [name] NO.:  [id number]  MEDICAL RECORD NO.:  [id number]  LOCATION:  [facility]                         FACILITY:  [facility]  PHYSICIAN:  Lyn Records, M.D.   DATE OF BIRTH:  04/22/1950  DATE OF PROCEDURE:  10/14/2010 DATE OF DISCHARGE:                           CARDIAC CATHETERIZATION   INDICATION:  Angina pectoris in a patient with history of prior coronary bypass, multiple relatively recent coronary stents, most recently to the native right coronary in January 2012.  PROCEDURES PERFORMED: 1. Left heart cath. 2. Selective coronary angio. 3. Left ventriculography. 4. Bypass graft angiography. 5. LIMA graft angiography.  DESCRIPTION:  A 5-French sheath was placed in the right femoral artery using the modified Seldinger technique.  A 5-French A2 multipurpose catheter was used for coronary angiography, saphenous vein graft angiography, intracoronary nitroglycerin administration into the left main coronary artery, and bypass graft angiography.  We then used an internal mammary artery catheter to perform left internal mammary angiography and native right coronary angiography.  We also used a Judkins right catheter to perform right coronary angiography.  We identified a 50-70% stenosis, focal, in-stent within the region previously stented in January.  Distal flow was TIMI grade 3.  We decided initially to proceed with PCI on the ISR in the right coronary.  While prepping the patient for this procedure, we changed the sheath to 6-French.  I spoke to his wife, who informed me that the patient had a wedding this coming week in [place]  After considering the situation, I felt that the in-stent restenosis is not critical and that it is unlikely the cause of his recent chest discomfort.  No real change in his coronary anatomy has occurred since the prior study in January other than the region of moderate to moderately severe in-stent restenosis.  After some  consideration and because I would certainly recommend that he not travel outside the country with a fresh stent, he decided to postpone any further intervention.  I concurred with his decision.  Angiomax that was started was discontinued.  The sheath will be pulled and manual compression held.  We will consider fixing the lesion in the right coronary at some point forward.  RESULTS: 1. Hemodynamic data:     a.     Aortic pressure 160/80 mmHg.     b.     Left ventricular pressure 152/70. 2. Left ventriculography:  Good LV squeeze.  EF is estimated to be 55-     60%. 3. Coronary angiography.     a.     Left main coronary artery:  Widely patent.     b.     Left anterior descending coronary artery:  Totally occluded      in the mid segment.     c.     Circumflex artery:  The circumflex coronary artery contains      a widely patent obtuse marginal #1.  There is diffuse disease and      high-grade obstruction on obtuse marginal #2.     d.     Right coronary artery:  The right coronary artery contains a      fully stented proximal to  distal segment.  In the mid RCA, at the      site of most recent stent implantation, there is eccentric      somewhat hypolucent 50-70% stenosis with TIMI grade 3 flow noted      distally.  A distal left ventricular branch is 99% obstructed and      is chronic. 4. Bypass graft angiography.     a.     Right internal mammary free graft to the RCA:  Occluded.     b.     Saphenous vein graft to the second obtuse marginal:  Widely      patent with an eccentric 40% midbody graft narrowing, unchanged      from prior.     c.     Saphenous vein graft to the diagonal:  Widely patent with no      evidence of restenosis in the previously stented midbody segment. 5. LIMA to LAD:  Widely patent.  Native LAD beyond the graft insertion     site 95% and unchanged from prior.  CONCLUSIONS: 1. Moderate to moderately severe in-stent restenosis, mid right     coronary. 2.  Widely patent saphenous vein graft to diagonal midbody stent. 3. Widely patent stent in the obtuse marginal #1. 4. Patent saphenous vein graft to obtuse marginal #2. 5. Patent left internal mammary to LAD with high-grade distal native     LAD disease beyond the graft insertion site. 6. 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 7. Normal LV function.  PLAN:  Medical therapy, stenting of ISR at some future point depending upon the patient's clinical course.     Lyn Records, M.D.     HWS/MEDQ  D:  10/14/2010  T:  10/14/2010  Job:  [id number]  Electronically Signed by Verdis Prime M.D. on 10/20/2010 11:03:55 AM

## 2012-06-19 ENCOUNTER — Encounter: Payer: Self-pay | Admitting: Gastroenterology

## 2012-09-04 ENCOUNTER — Encounter: Payer: Self-pay | Admitting: Gastroenterology

## 2012-10-02 ENCOUNTER — Ambulatory Visit (INDEPENDENT_AMBULATORY_CARE_PROVIDER_SITE_OTHER): Payer: 59 | Admitting: Family Medicine

## 2012-10-02 ENCOUNTER — Encounter: Payer: Self-pay | Admitting: Family Medicine

## 2012-10-02 VITALS — BP 144/98 | HR 69 | Ht 73.0 in | Wt 175.0 lb

## 2012-10-02 DIAGNOSIS — M25569 Pain in unspecified knee: Secondary | ICD-10-CM

## 2012-10-02 DIAGNOSIS — M25561 Pain in right knee: Secondary | ICD-10-CM

## 2012-10-02 NOTE — Patient Instructions (Addendum)
Your pain is due to hamstring weakness, spasm, developing pes anserine bursitis. Start with home exercises - hamstring curls, swings, running lunge, hacky sack exercise, and standing hip rotations 3 sets of 10 once day. Can add ankle weight if these are too easy. Do regularly for next 6 weeks. Icing 15 minutes at a time as needed. Tylenol or aleve as needed for pain. Consider knee sleeve for compression. Rest from running for 1 week - when returning make sure you listen to your body - if limping or pain is worse than a 3 on a scale of 1-10, stop. Cross train with cycling in meantime. Follow up with me in 1 month or as needed. Consider formal physical therapy if not improving.

## 2012-10-03 ENCOUNTER — Encounter: Payer: Self-pay | Admitting: Family Medicine

## 2012-10-03 DIAGNOSIS — M25561 Pain in right knee: Secondary | ICD-10-CM | POA: Insufficient documentation

## 2012-10-03 NOTE — Assessment & Plan Note (Signed)
2/2 hamstring weakness, spasms, pes bursitis.  Start with HEP - declined formal PT for now.  Icing, tylenol/nsaids.  Sleeve for compression.  Rest for next week then return to running with walk: jog program.  [place] train in meantime.  F/u in 1 month or prn.

## 2012-10-03 NOTE — Progress Notes (Signed)
Patient ID: Tommy [name], male   DOB: 02-27-1950, 61 y.o.   MRN: [id number]  PCP: Enid Baas, MD  Subjective:   HPI: Patient is a 62 y.o. male here for right leg pain.  Patient reports he runs on average 30 miles a week. On Friday he ran 10 miles - some soreness in right knee. Sunday went hiking without any problems. Monday ran 6 miles - soreness posterior to medial right knee. Then Tuesday pain came on severely 3 miles into a 6 mile run in same areas, had to stop. + swelling mildly. Tried ibuprofen. No prior knee problems. Difficulty sleeping last night due to pain.  Past Medical History  Diagnosis Date  . CAD (coronary artery disease)   . Hyperlipidemia   . Vertigo   . GERD (gastroesophageal reflux disease)   . Kidney stone     No current outpatient prescriptions on file prior to visit.   No current facility-administered medications on file prior to visit.    Past Surgical History  Procedure Laterality Date  . Coronary artery bypass graft    . Cholecystectomy      No Known Allergies  History   Social History  . Marital Status: Married    Spouse Name: N/A    Number of Children: N/A  . Years of Education: N/A   Occupational History  . Not on file.   Social History Main Topics  . Smoking status: Never Smoker   . Smokeless tobacco: Not on file  . Alcohol Use: Not on file  . Drug Use: Not on file  . Sexual Activity: Not on file   Other Topics Concern  . Not on file   Social History Narrative  . No narrative on file    Family History  Problem Relation Age of Onset  . Colon cancer Father   . Sudden death Neg Hx   . Hypertension Neg Hx   . Hyperlipidemia Neg Hx   . Heart attack Neg Hx   . Diabetes Neg Hx     BP 144/98  Pulse 69  Ht 6\' 1"  (1.854 m)  Wt 175 lb (79.379 kg)  BMI 23.09 kg/m2  Review of Systems: See HPI above.    Objective:  Physical Exam:  Gen: NAD  R knee: No gross deformity, ecchymoses, swelling, effusion. TTP  medial hamstring, pes anserine area less. FROM.  Pain reproduced with knee flexion at 30 degrees, resisted - 5-/5 strength. Negative ant/post drawers. Negative valgus/varus testing. Negative lachmanns. Negative mcmurrays, apleys, patellar apprehension. NV intact distally.    Assessment & Plan:  1. Right knee pain - 2/2 hamstring weakness, spasms, pes bursitis.  Start with HEP - declined formal PT for now.  Icing, tylenol/nsaids.  Sleeve for compression.  Rest for next week then return to running with walk: jog program.  [place] train in meantime.  F/u in 1 month or prn.

## 2012-10-09 ENCOUNTER — Telehealth: Payer: Self-pay | Admitting: Interventional Cardiology

## 2012-10-09 ENCOUNTER — Encounter: Payer: Self-pay | Admitting: Gastroenterology

## 2012-10-09 ENCOUNTER — Ambulatory Visit (INDEPENDENT_AMBULATORY_CARE_PROVIDER_SITE_OTHER): Payer: 59 | Admitting: Gastroenterology

## 2012-10-09 VITALS — BP 130/80 | HR 72 | Ht 73.0 in | Wt 182.2 lb

## 2012-10-09 DIAGNOSIS — Z800 Family history of malignant neoplasm of digestive organs: Secondary | ICD-10-CM

## 2012-10-09 MED ORDER — MOVIPREP 100 G PO SOLR: 1.0000 | Freq: Once | ORAL | Status: DC

## 2012-10-09 NOTE — Patient Instructions (Addendum)
You will be set up for a colonoscopy for family history of colon cancer (LEC, no sedation/moderate sedation if needed). We will contact your cardiologist (Dr. Garnette Scheuermann) about holding your plavix fo 5 days prior to the colonoscopy.

## 2012-10-09 NOTE — Telephone Encounter (Signed)
Pt has been notified of the ok to stop plavix 5 days prior

## 2012-10-09 NOTE — Progress Notes (Signed)
Review of pertinent gastrointestinal problems: 1. Father had colon cancer:  Colonoscopy 07/2007 Christella Hartigan, was normal, recommended recall colonoscopy at 5 years.   HPI: This is a   very pleasant 62 year old man whom I last saw him a colonoscopy about 5 years ago.  He has been having no GI symptoms since then. Specifically no bleeding, no changes in his bowels. No unexplained weight loss. No significant abdominal pains.  He is on Plavix for coronary artery disease and history of stenting.  Father had colon cancer in his 38s. No other colon polyps or cancers in family.   Review of systems: Pertinent positive and negative review of systems were noted in the above HPI section. Complete review of systems was performed and was otherwise normal.    Past Medical History  Diagnosis Date  . CAD (coronary artery disease)   . Hyperlipidemia   . Vertigo   . GERD (gastroesophageal reflux disease)   . Kidney stone     Past Surgical History  Procedure Laterality Date  . Coronary artery bypass graft    . Cholecystectomy      Current Outpatient Prescriptions  Medication Sig Dispense Refill  . aspirin 81 MG tablet Take 81 mg by mouth daily.      . Atorvastatin Calcium (LIPITOR PO) Take 5 mg by mouth. daily      . Cholecalciferol (VITAMIN D PO) Take by mouth.      . clopidogrel (PLAVIX) 75 MG tablet       . LASTACAFT 0.25 % SOLN       . metoprolol succinate (TOPROL-XL) 25 MG 24 hr tablet       . NIACINAMIDE PO Take by mouth.      Marland Kitchen NITROSTAT 0.4 MG SL tablet       . Omeprazole Magnesium (PRILOSEC OTC PO) Take by mouth.      . TRANSDERM-SCOP 1.5 MG       . WELCHOL 3.75 G PACK        No current facility-administered medications for this visit.    Allergies as of 10/09/2012  . (No Known Allergies)    Family History  Problem Relation Age of Onset  . Colon cancer Father   . Sudden death Neg Hx   . Hypertension Neg Hx   . Hyperlipidemia Neg Hx   . Heart attack Neg Hx   . Diabetes Neg  Hx     History   Social History  . Marital Status: Married    Spouse Name: N/A    Number of Children: N/A  . Years of Education: N/A   Occupational History  . Not on file.   Social History Main Topics  . Smoking status: Never Smoker   . Smokeless tobacco: Never Used  . Alcohol Use: 0.6 oz/week    1 Glasses of wine per week     Comment: prn  . Drug Use: No  . Sexual Activity: Not on file   Other Topics Concern  . Not on file   Social History Narrative  . No narrative on file       Physical Exam: BP 130/80  Pulse 72  Ht 6\' 1"  (1.854 m)  Wt 182 lb 4 oz (82.668 kg)  BMI 24.05 kg/m2 Constitutional: generally well-appearing Psychiatric: alert and oriented x3 Eyes: extraocular movements intact Mouth: oral pharynx moist, no lesions Neck: supple no lymphadenopathy Cardiovascular: heart regular rate and rhythm Lungs: clear to auscultation bilaterally Abdomen: soft, nontender, nondistended, no obvious ascites, no peritoneal signs, normal  bowel sounds Extremities: no lower extremity edema bilaterally Skin: no lesions on visible extremities    Assessment and plan: 62 y.o. male with  elevated risk for colon cancer, family history of colon cancer; increased risk for bleeding complications during colonoscopy given his ongoing blood thinner use  We will communicate with his cardiologist about him holding his Plavix for 5 days prior to colonoscopy. He would like to try this unsedated again which I think is reasonable. He was successful at 5 years ago. I see no reason for any further blood tests or imaging studies prior to then.

## 2012-10-09 NOTE — Telephone Encounter (Signed)
Patient may stop Plavix on 10/3, 5 days prior to colonoscopy on 10/8 per Dr. Garnette Scheuermann.  I notified Patti @ [facility].

## 2012-10-09 NOTE — Telephone Encounter (Signed)
New problem    Need to stop plavix on Friday 10/ 3. Upcoming colonoscopy on  10/8 .

## 2012-10-16 ENCOUNTER — Encounter: Payer: Self-pay | Admitting: Gastroenterology

## 2012-10-16 ENCOUNTER — Ambulatory Visit (AMBULATORY_SURGERY_CENTER): Payer: 59 | Admitting: Gastroenterology

## 2012-10-16 VITALS — BP 152/79 | HR 79 | Temp 97.0°F | Resp 15 | Ht 73.0 in | Wt 182.0 lb

## 2012-10-16 DIAGNOSIS — K5730 Diverticulosis of large intestine without perforation or abscess without bleeding: Secondary | ICD-10-CM

## 2012-10-16 DIAGNOSIS — Z1211 Encounter for screening for malignant neoplasm of colon: Secondary | ICD-10-CM

## 2012-10-16 DIAGNOSIS — Z800 Family history of malignant neoplasm of digestive organs: Secondary | ICD-10-CM

## 2012-10-16 MED ORDER — SODIUM CHLORIDE 0.9 % IV SOLN: 500.0000 mL | INTRAVENOUS | Status: DC

## 2012-10-16 NOTE — Progress Notes (Signed)
Per the pt and Dr. Christella Hartigan the colonoscopy will be done without sedation.  IV in right hand if needed.  Dr. Christella Hartigan explained to the pt if he decides he wants sedation, he can let us know and we will give it.  Per the pt, he stopped his plavix Friday night...been off plavix for 5 days.maw  The pt tolerated reaching the cecum extremely well.  He did not complain of any discomfort. Maw

## 2012-10-16 NOTE — Progress Notes (Addendum)
No egg or soy allergy. ewm  Pt states he doesn't want sedation. He did not receive sedation last time and did fine. He has no care partner today. Dr Christella Hartigan wanted IV started just in  Case of emergency. Pt verbalized understanding of this despite the fact he did not want an iv started this am. Pt did consent for Iv start but only to the saline. No meds / sedation per pt. ewm

## 2012-10-16 NOTE — Op Note (Signed)
[facility] [address] [place], [address]   COLONOSCOPY PROCEDURE REPORT  PATIENT: Tommy [name], Tommy [name]  MR#: [id number] BIRTHDATE: [birth year] , 62  yrs. old GENDER: Male ENDOSCOPIST: Rachael Fee, MD PROCEDURE DATE:  10/16/2012 PROCEDURE:   Colonoscopy, screening First Screening Colonoscopy - Avg.  risk and is 50 yrs.  old or older - No.  Prior Negative Screening - Now for repeat screening. Other: See Comments  History of Adenoma - Now for follow-up colonoscopy & has been > or = to 3 yrs.  N/A  Polyps Removed Today? No.  Recommend repeat exam, <10 yrs? Yes.  High risk (family or personal hx). ASA CLASS:   Class II INDICATIONS:father had colon cancer; colonoscopy 2009 found no polyps. MEDICATIONS: None  DESCRIPTION OF PROCEDURE:   After the risks benefits and alternatives of the procedure were thoroughly explained, informed consent was obtained.  A digital rectal exam revealed no abnormalities of the rectum.   The LB [id number]  endoscope was introduced through the anus and advanced to the cecum, which was identified by both the appendix and ileocecal valve. No adverse events experienced.   The quality of the prep was good.  The instrument was then slowly withdrawn as the colon was fully examined.   COLON FINDINGS: There were a few diverticulum in the left colon with associated mucosal erythema, thickening.  The examination was otherwise normal.  Retroflexed views revealed no abnormalities. The time to cecum=2 minutes 08 seconds.  Withdrawal time=13 minutes 02 seconds.  The scope was withdrawn and the procedure completed. COMPLICATIONS: There were no complications.  ENDOSCOPIC IMPRESSION: There were a few diverticulum in the left colon with associated mucosal erythema, thickening. The examination was otherwise normal.  No polyps or cancers  RECOMMENDATIONS: Given your significant family history of colon cancer, you should have a repeat  colonoscopy in 5 years   eSigned:  Rachael Fee, MD 10/16/2012 9:04 AM   cc: Enid Baas, MD

## 2012-10-16 NOTE — Patient Instructions (Signed)
YOU HAD AN ENDOSCOPIC PROCEDURE TODAY AT THE [facility]: Refer to the procedure report that was given to you for any specific questions about what was found during the examination.  If the procedure report does not answer your questions, please call your gastroenterologist to clarify.  If you requested that your care partner not be given the details of your procedure findings, then the procedure report has been included in a sealed envelope for you to review at your convenience later.  YOU SHOULD EXPECT: Some feelings of bloating in the abdomen. Passage of more gas than usual.  Walking can help get rid of the air that was put into your GI tract during the procedure and reduce the bloating. If you had a lower endoscopy (such as a colonoscopy or flexible sigmoidoscopy) you may notice spotting of blood in your stool or on the toilet paper. If you underwent a bowel prep for your procedure, then you may not have a normal bowel movement for a few days.  DIET: Your first meal following the procedure should be a light meal and then it is ok to progress to your normal diet.  A half-sandwich or bowl of soup is an example of a good first meal.  Heavy or fried foods are harder to digest and may make you feel nauseous or bloated.  Likewise meals heavy in dairy and vegetables can cause extra gas to form and this can also increase the bloating.  Drink plenty of fluids but you should avoid alcoholic beverages for 24 hours.  ACTIVITY: .    You can resume normal activity   SYMPTOMS TO REPORT IMMEDIATELY: A gastroenterologist can be reached at any hour.  During normal business hours, 8:30 AM to 5:00 PM Monday through Friday, call [phone].  After hours and on weekends, please call the GI answering service at [phone] who will take a message and have the physician on call contact you.   Following lower endoscopy (colonoscopy or flexible sigmoidoscopy):  Excessive amounts of blood in the  stool  Significant tenderness or worsening of abdominal pains  Swelling of the abdomen that is new, acute  Fever of 100F or higher  FOLLOW UP: If any biopsies were taken you will be contacted by phone or by letter within the next 1-3 weeks.  Call your gastroenterologist if you have not heard about the biopsies in 3 weeks.  Our staff will call the home number listed on your records the next business day following your procedure to check on you and address any questions or concerns that you may have at that time regarding the information given to you following your procedure. This is a courtesy call and so if there is no answer at the home number and we have not heard from you through the emergency physician on call, we will assume that you have returned to your regular daily activities without incident.  SIGNATURES/CONFIDENTIALITY: You and/or your care partner have signed paperwork which will be entered into your electronic medical record.  These signatures attest to the fact that that the information above on your After Visit Summary has been reviewed and is understood.  Full responsibility of the confidentiality of this discharge information lies with you and/or your care-partner.  Resume medication.. Information given on diverticulosis and high fiber diet with discharge instructions.

## 2012-10-16 NOTE — Progress Notes (Signed)
Patient did not experience any of the following events: a burn prior to discharge; a fall within the facility; wrong site/side/patient/procedure/implant event; or a hospital transfer or hospital admission upon discharge from the facility. (G8907) Patient did not have preoperative order for IV antibiotic SSI prophylaxis. (G8918)  

## 2012-10-17 ENCOUNTER — Telehealth: Payer: Self-pay

## 2012-10-17 NOTE — Telephone Encounter (Signed)
  Follow up Call-  Call back number 10/16/2012  Post procedure Call Back phone  # [phone]  Permission to leave phone message Yes     Patient questions:  Do you have a fever, pain , or abdominal swelling? no Pain Score  0 *  Have you tolerated food without any problems? yes  Have you been able to return to your normal activities? yes  Do you have any questions about your discharge instructions: Diet   no Medications  no Follow up visit  no  Do you have questions or concerns about your Care? no  Actions: * If pain score is 4 or above: No action needed, pain <4.  No problems per the pt.  Everything went well. Maw

## 2012-11-01 ENCOUNTER — Encounter: Payer: Self-pay | Admitting: Family Medicine

## 2012-11-01 ENCOUNTER — Ambulatory Visit (INDEPENDENT_AMBULATORY_CARE_PROVIDER_SITE_OTHER): Payer: 59 | Admitting: Family Medicine

## 2012-11-01 VITALS — BP 132/89 | HR 76 | Ht 73.0 in | Wt 175.0 lb

## 2012-11-01 DIAGNOSIS — M25561 Pain in right knee: Secondary | ICD-10-CM

## 2012-11-01 DIAGNOSIS — M25569 Pain in unspecified knee: Secondary | ICD-10-CM

## 2012-11-05 ENCOUNTER — Encounter: Payer: Self-pay | Admitting: Family Medicine

## 2012-11-05 NOTE — Progress Notes (Signed)
Patient ID: Tommy [name], male   DOB: February 02, 1950, 62 y.o.   MRN: [id number]  PCP: Enid Baas, MD  Subjective:   HPI: Patient is a 62 y.o. male here for right leg pain.  9/24: Patient reports he runs on average 30 miles a week. On Friday he ran 10 miles - some soreness in right knee. Sunday went hiking without any problems. Monday ran 6 miles - soreness posterior to medial right knee. Then Tuesday pain came on severely 3 miles into a 6 mile run in same areas, had to stop. + swelling mildly. Tried ibuprofen. No prior knee problems. Difficulty sleeping last night due to pain.  10/24: Patient reports he is back to running after a week off. Up to his usual 12 miles at a time without discomfort. No longer needing medicine, icing, knee sleeve. Cross trained initially.  Past Medical History  Diagnosis Date  . CAD (coronary artery disease)   . Hyperlipidemia   . Vertigo   . GERD (gastroesophageal reflux disease)   . Kidney stone   . Blood transfusion without reported diagnosis     Current Outpatient Prescriptions on File Prior to Visit  Medication Sig Dispense Refill  . aspirin 81 MG tablet Take 81 mg by mouth daily.      . Atorvastatin Calcium (LIPITOR PO) Take 5 mg by mouth. daily      . Cholecalciferol (VITAMIN D PO) Take by mouth.      . clopidogrel (PLAVIX) 75 MG tablet       . LASTACAFT 0.25 % SOLN       . metoprolol succinate (TOPROL-XL) 25 MG 24 hr tablet       . MOVIPREP 100 G SOLR Take 1 kit (200 g total) by mouth once.  1 kit  0  . NIACINAMIDE PO Take by mouth.      Marland Kitchen NITROSTAT 0.4 MG SL tablet       . Omeprazole Magnesium (PRILOSEC OTC PO) Take by mouth.      . TRANSDERM-SCOP 1.5 MG       . WELCHOL 3.75 G PACK        No current facility-administered medications on file prior to visit.    Past Surgical History  Procedure Laterality Date  . Coronary artery bypass graft    . Cholecystectomy    . Colonoscopy      No Known Allergies  History   Social  History  . Marital Status: Married    Spouse Name: N/A    Number of Children: N/A  . Years of Education: N/A   Occupational History  . Not on file.   Social History Main Topics  . Smoking status: Never Smoker   . Smokeless tobacco: Never Used  . Alcohol Use: 0.6 oz/week    1 Glasses of wine per week     Comment: prn  . Drug Use: No  . Sexual Activity: Not on file   Other Topics Concern  . Not on file   Social History Narrative  . No narrative on file    Family History  Problem Relation Age of Onset  . Colon cancer Father   . Sudden death Neg Hx   . Hypertension Neg Hx   . Hyperlipidemia Neg Hx   . Heart attack Neg Hx   . Diabetes Neg Hx   . Rectal cancer Neg Hx   . Stomach cancer Neg Hx     BP 132/89  Pulse 76  Ht 6\' 1"  (1.854  m)  Wt 175 lb (79.379 kg)  BMI 23.09 kg/m2  Review of Systems: See HPI above.    Objective:  Physical Exam:  Gen: NAD  R knee: No gross deformity, ecchymoses, swelling, effusion. No longer with TTP medial hamstring, pes anserine area. FROM.  No pain reproduced with knee flexion at 30 degrees, resisted - 5/5 strength. Negative ant/post drawers. Negative valgus/varus testing. Negative lachmanns. Negative mcmurrays, apleys, patellar apprehension. NV intact distally.    Assessment & Plan:  1. Right knee pain - 2/2 hamstring weakness, spasms, pes bursitis.  Much improved with home exercise program.  Continue this another 4-6 weeks.  Activities as tolerated.  F/u prn.

## 2012-11-05 NOTE — Assessment & Plan Note (Signed)
2/2 hamstring weakness, spasms, pes bursitis.  Much improved with home exercise program.  Continue this another 4-6 weeks.  Activities as tolerated.  F/u prn.

## 2012-11-25 ENCOUNTER — Ambulatory Visit (INDEPENDENT_AMBULATORY_CARE_PROVIDER_SITE_OTHER): Payer: 59 | Admitting: Interventional Cardiology

## 2012-11-25 ENCOUNTER — Encounter: Payer: Self-pay | Admitting: Interventional Cardiology

## 2012-11-25 VITALS — BP 144/88 | HR 78 | Ht 73.0 in | Wt 176.0 lb

## 2012-11-25 DIAGNOSIS — I2510 Atherosclerotic heart disease of native coronary artery without angina pectoris: Secondary | ICD-10-CM

## 2012-11-25 DIAGNOSIS — I471 Supraventricular tachycardia: Secondary | ICD-10-CM

## 2012-11-25 DIAGNOSIS — E785 Hyperlipidemia, unspecified: Secondary | ICD-10-CM

## 2012-11-25 DIAGNOSIS — I10 Essential (primary) hypertension: Secondary | ICD-10-CM

## 2012-11-25 DIAGNOSIS — I498 Other specified cardiac arrhythmias: Secondary | ICD-10-CM

## 2012-11-25 NOTE — Patient Instructions (Signed)
Your physician recommends that you continue on your current medications as directed. Please refer to the Current Medication list given to you today.  Your physician recommends that you return for a FASTING lipid profile and Liver panel in 6 months  Your physician wants you to follow-up in: 1 year You will receive a reminder letter in the mail two months in advance. If you don't receive a letter, please call our office to schedule the follow-up appointment.

## 2012-11-25 NOTE — Progress Notes (Signed)
Patient ID: Tommy [name], male   DOB: 1950-12-25, 62 y.o.   MRN: [id number]    [address], [address] [place], [place]  [address] Phone: [phone] Fax:  [phone]  Date:  11/25/2012   ID:  Tery Sanfilippo, DOB 08-Mar-1950, MRN [id number]  PCP:  Enid Baas, MD   ASSESSMENT:  1. Coronary artery disease, asymptomatic despite continuing to do half marathons. Minimal angina. 2. Hyperlipidemia on therapy, with LDLs less than 100 3. Junctional tachycardia, intermittent. Rare but stable. 4. Blood pressure control  PLAN:  1. continue current regimen 2. Fasting hepatic and lipid panel in 6 months 3. Followup in one year   SUBJECTIVE: Tommy [name] is a 62 y.o. male who has minimal to no angina. He recently ran a half marathon without difficulty. No nitroglycerin use. No medication side effects. He denies claudication. He also denies medication side effects and has not had neurological symptoms. No specific medication side effects.   Wt Readings from Last 3 Encounters:  11/25/12 176 lb (79.833 kg)  11/01/12 175 lb (79.379 kg)  10/16/12 182 lb (82.555 kg)     Past Medical History  Diagnosis Date  . CAD (coronary artery disease)   . Hyperlipidemia   . Vertigo   . GERD (gastroesophageal reflux disease)   . Kidney stone   . Blood transfusion without reported diagnosis     Current Outpatient Prescriptions  Medication Sig Dispense Refill  . aspirin 81 MG tablet Take 81 mg by mouth daily.      . Atorvastatin Calcium (LIPITOR PO) Take 5 mg by mouth. daily      . Cholecalciferol (VITAMIN D PO) Take by mouth.      . clopidogrel (PLAVIX) 75 MG tablet       . metoprolol succinate (TOPROL-XL) 25 MG 24 hr tablet       . NITROSTAT 0.4 MG SL tablet       . Omeprazole Magnesium (PRILOSEC OTC PO) Take by mouth.      Lilian Kapur 3.75 G PACK        No current facility-administered medications for this visit.    Allergies:   No Known Allergies  Social History:  The patient   reports that he has never smoked. He has never used smokeless tobacco. He reports that he drinks about 0.6 ounces of alcohol per week. He reports that he does not use illicit drugs.   ROS:  Please see the history of present illness.   Chronic muscle soreness after long runs   All other systems reviewed and negative.   OBJECTIVE: VS:  BP 144/88  Pulse 78  Ht 6\' 1"  (1.854 m)  Wt 176 lb (79.833 kg)  BMI 23.23 kg/m2 Well nourished, well developed, in no acute distress, younger than stated age HEENT: normal Neck: JVD flat. Carotid bruit faint left carotid bruit  Cardiac:  normal S1, S2; RRR; no murmur Lungs:  clear to auscultation bilaterally, no wheezing, rhonchi or rales Abd: soft, nontender, no hepatomegaly Ext: Edema  None . Pulses 2+ and symmetric  Skin: warm and dry Neuro:  CNs 2-12 intact, no focal abnormalities noted  EKG:  Left bundle branch block, normal sinus rhythm   . This tracing is unchanged from November 2013     Signed, Darci Needle III, MD 11/25/2012 2:45 PM

## 2012-12-02 ENCOUNTER — Other Ambulatory Visit: Payer: Self-pay | Admitting: *Deleted

## 2012-12-02 DIAGNOSIS — E7800 Pure hypercholesterolemia, unspecified: Secondary | ICD-10-CM

## 2012-12-19 ENCOUNTER — Telehealth: Payer: Self-pay | Admitting: Pharmacist

## 2012-12-19 DIAGNOSIS — E785 Hyperlipidemia, unspecified: Secondary | ICD-10-CM

## 2012-12-19 NOTE — Telephone Encounter (Signed)
New problem   Pt would like for Riki Rusk to give him a call wouldn't state why

## 2012-12-19 NOTE — Telephone Encounter (Signed)
Patient wanted to know what medication he could take for a cough and chest congestion he's had with a cold.  Patient notified to get Mucinex DM and take this twice daily.  This will be okay with his other medications.  Patient agreeable to this.  To Dr. Katrinka Blazing as FYI only.

## 2012-12-23 ENCOUNTER — Other Ambulatory Visit (INDEPENDENT_AMBULATORY_CARE_PROVIDER_SITE_OTHER): Payer: 59

## 2012-12-23 DIAGNOSIS — E7800 Pure hypercholesterolemia, unspecified: Secondary | ICD-10-CM

## 2012-12-23 LAB — HEPATIC FUNCTION PANEL
ALT: 101 U/L — ABNORMAL HIGH (ref 0–53)
AST: 47 U/L — ABNORMAL HIGH (ref 0–37)
Alkaline Phosphatase: 66 U/L (ref 39–117)
Total Bilirubin: 0.4 mg/dL (ref 0.3–1.2)

## 2012-12-23 LAB — LIPID PANEL
HDL: 42.3 mg/dL (ref >39.00)
Total CHOL/HDL Ratio: 3
VLDL: 23.2 mg/dL (ref 0.0–40.0)

## 2012-12-26 ENCOUNTER — Telehealth: Payer: Self-pay

## 2012-12-26 NOTE — Telephone Encounter (Signed)
Pt given lab results.Liver enzymes are elevated mildly. ? Any alcohol, significant tylenol use, or liver disease history? Repeat hepatic in 4 weeks.pt sts that he has been taking Tylenol and a new medication given by his urologist not sure if that could affect liver enzymes. Lab appt 01/23/13 repeat hepatic.pt verbalized understanding.

## 2012-12-26 NOTE — Telephone Encounter (Signed)
Message copied by Jarvis Newcomer on Thu Dec 26, 2012  3:04 PM ------      Message from: Verdis Prime      Created: Mon Dec 23, 2012  7:23 PM       Lipids are great.      Liver enzymes are elevated mildly. ? Any alcohol, significant tylenol use, or liver disease history? Repeat hepatic in 4 weeks. ------

## 2012-12-26 NOTE — Telephone Encounter (Signed)
Lab results lipid, hepatic mailed to pt

## 2013-01-01 ENCOUNTER — Other Ambulatory Visit: Payer: Self-pay

## 2013-01-01 MED ORDER — METOPROLOL SUCCINATE ER 25 MG PO TB24: 25.0000 mg | ORAL_TABLET | Freq: Every day | ORAL | Status: DC

## 2013-01-23 ENCOUNTER — Other Ambulatory Visit (INDEPENDENT_AMBULATORY_CARE_PROVIDER_SITE_OTHER): Payer: 59

## 2013-01-23 DIAGNOSIS — E785 Hyperlipidemia, unspecified: Secondary | ICD-10-CM

## 2013-01-23 LAB — HEPATIC FUNCTION PANEL
ALK PHOS: 67 U/L (ref 39–117)
ALT: 26 U/L (ref 0–53)
AST: 18 U/L (ref 0–37)
Albumin: 4.1 g/dL (ref 3.5–5.2)
Bilirubin, Direct: 0 mg/dL (ref 0.0–0.3)
Total Bilirubin: 0.7 mg/dL (ref 0.3–1.2)
Total Protein: 7 g/dL (ref 6.0–8.3)

## 2013-01-24 ENCOUNTER — Telehealth: Payer: Self-pay

## 2013-01-24 DIAGNOSIS — E785 Hyperlipidemia, unspecified: Secondary | ICD-10-CM

## 2013-01-24 NOTE — Telephone Encounter (Signed)
Message copied by Lamar Laundry on Fri Jan 24, 2013  2:54 PM ------      Message from: Daneen Schick      Created: Thu Jan 23, 2013  8:04 PM       Liver test back to normal. Continue same and re-look at FLP and ALT in fall. ------

## 2013-02-24 ENCOUNTER — Other Ambulatory Visit: Payer: Self-pay

## 2013-02-24 MED ORDER — ATORVASTATIN CALCIUM 10 MG PO TABS: 10.0000 mg | ORAL_TABLET | Freq: Every day | ORAL | Status: DC

## 2013-03-26 ENCOUNTER — Other Ambulatory Visit: Payer: Self-pay | Admitting: Interventional Cardiology

## 2013-06-19 ENCOUNTER — Other Ambulatory Visit (INDEPENDENT_AMBULATORY_CARE_PROVIDER_SITE_OTHER): Payer: 59

## 2013-06-19 DIAGNOSIS — E785 Hyperlipidemia, unspecified: Secondary | ICD-10-CM

## 2013-06-19 LAB — LIPID PANEL
Cholesterol: 166 mg/dL (ref 0–200)
HDL: 43 mg/dL (ref >39.00)
LDL Cholesterol: 81 mg/dL (ref 0–99)
NonHDL: 123
TRIGLYCERIDES: 212 mg/dL — AB (ref 0.0–149.0)
Total CHOL/HDL Ratio: 4
VLDL: 42.4 mg/dL — AB (ref 0.0–40.0)

## 2013-06-19 LAB — ALT: ALT: 23 U/L (ref 0–53)

## 2013-06-27 ENCOUNTER — Telehealth: Payer: Self-pay | Admitting: Interventional Cardiology

## 2013-06-27 DIAGNOSIS — E785 Hyperlipidemia, unspecified: Secondary | ICD-10-CM

## 2013-06-27 NOTE — Telephone Encounter (Signed)
Message copied by Lamar Laundry on Fri Jun 27, 2013  9:16 AM ------      Message from: Daneen Schick      Created: Wed Jun 25, 2013  3:24 PM       Really good. Keep it up. Repeat in 9-12 months ------

## 2013-06-27 NOTE — Telephone Encounter (Signed)
Message copied by Lamar Laundry on Fri Jun 27, 2013  9:30 AM ------      Message from: Daneen Schick      Created: Wed Jun 25, 2013  3:24 PM       Really good. Keep it up. Repeat in 9-12 months ------

## 2013-06-27 NOTE — Telephone Encounter (Signed)
New message ° ° ° ° °Want lab results °

## 2013-06-27 NOTE — Telephone Encounter (Signed)
returned pt call.pt aware of lab results.  Really good. Keep it up. Repeat in 9-12 months.pt rqst info for mychart set up.activation code mailed to pt.pt verbalized understanding.

## 2013-07-02 ENCOUNTER — Telehealth: Payer: Self-pay | Admitting: Interventional Cardiology

## 2013-07-02 NOTE — Telephone Encounter (Signed)
New message    Patient did not wants to disclose any information will discuss when the nurse calls.

## 2013-07-02 NOTE — Telephone Encounter (Signed)
returned pt call.pt sts that he has not received my chart activation code that was mailed out to him on 6/19.adv pt I will mail a new activation code out today.verified pt mailing address.pt verbalized understanding.

## 2013-07-03 ENCOUNTER — Encounter: Payer: Self-pay | Admitting: *Deleted

## 2013-07-07 ENCOUNTER — Telehealth: Payer: Self-pay | Admitting: Interventional Cardiology

## 2013-07-07 NOTE — Telephone Encounter (Signed)
pt aware that my chart code originall mailed is not valid since a new code was generated and mailed to pt, he should receive it in the mail in a couple of days.pt verbalized understanding.

## 2013-07-07 NOTE — Telephone Encounter (Signed)
New message     Patient calling back to speak with Lattie Haw regarding activation code for my chart.

## 2013-07-21 ENCOUNTER — Other Ambulatory Visit: Payer: Self-pay | Admitting: Interventional Cardiology

## 2013-08-23 ENCOUNTER — Other Ambulatory Visit: Payer: Self-pay | Admitting: Interventional Cardiology

## 2013-11-07 ENCOUNTER — Other Ambulatory Visit: Payer: Self-pay | Admitting: Interventional Cardiology

## 2013-12-15 ENCOUNTER — Other Ambulatory Visit: Payer: Self-pay | Admitting: Interventional Cardiology

## 2013-12-31 ENCOUNTER — Other Ambulatory Visit: Payer: Self-pay | Admitting: Interventional Cardiology

## 2014-01-29 ENCOUNTER — Ambulatory Visit (INDEPENDENT_AMBULATORY_CARE_PROVIDER_SITE_OTHER): Payer: PRIVATE HEALTH INSURANCE | Admitting: Interventional Cardiology

## 2014-01-29 ENCOUNTER — Encounter: Payer: Self-pay | Admitting: Interventional Cardiology

## 2014-01-29 VITALS — BP 144/96 | HR 68 | Ht 73.0 in | Wt 189.1 lb

## 2014-01-29 DIAGNOSIS — Z136 Encounter for screening for cardiovascular disorders: Secondary | ICD-10-CM

## 2014-01-29 DIAGNOSIS — I10 Essential (primary) hypertension: Secondary | ICD-10-CM

## 2014-01-29 DIAGNOSIS — I25709 Atherosclerosis of coronary artery bypass graft(s), unspecified, with unspecified angina pectoris: Secondary | ICD-10-CM

## 2014-01-29 DIAGNOSIS — E785 Hyperlipidemia, unspecified: Secondary | ICD-10-CM

## 2014-01-29 DIAGNOSIS — I471 Supraventricular tachycardia: Secondary | ICD-10-CM | POA: Diagnosis not present

## 2014-01-29 NOTE — Progress Notes (Signed)
Patient ID: COPPER BASNETT, male   DOB: 1950/05/26, 64 y.o.   MRN: [id number]    [address], [address] [place], [place]  [address] Phone: [phone] Fax:  [phone]  Date:  01/29/2014   ID:  SABASTION HRDLICKA, DOB Mar 18, 1950, MRN [id number]  PCP:  Stefanie Libel, MD   ASSESSMENT:  1. Coronary artery disease, status post CABG and PCI, asymptomatic 2. Essential hypertension, borderline control 3. Hyperlipidemia, with at target levels 6 months prior 4. Generalized atherosclerosis. Need to exclude abdominal aortic aneurysm 5. History of PSVT, asymptomatic  PLAN:  1. Abdominal duplex to rule out abdominal aortic aneurysm 2. Fasting lipid and hepatic panel 3. Clinical follow-up 1 year 4. No change in medical regimen   SUBJECTIVE: DARIN ARNDT is a 64 y.o. male who is doing well. He participated in 3 half marathons this year. He has not needed nitroglycerin. Exertional tolerance has been stable. He denies claudication. No episodes of prolonged palpitation.   Wt Readings from Last 3 Encounters:  01/29/14 189 lb 1.9 oz (85.784 kg)  11/25/12 176 lb (79.833 kg)  11/01/12 175 lb (79.379 kg)     Past Medical History  Diagnosis Date  . Vertigo   . GERD (gastroesophageal reflux disease)   . Blood transfusion without reported diagnosis   . CAD (coronary artery disease)     with CABG LIMA to LAD, free radial PDA, SVG to diagonal, SVG to OM. 2002. DES May 2011 and 01-2010 to SVG of Diag, native OM, and Native RCA. Unable to stent LAD via LIMA  . Hyperlipidemia     unable to tolarate statin therapy  . Kidney stone     Current Outpatient Prescriptions  Medication Sig Dispense Refill  . aspirin 81 MG tablet Take 81 mg by mouth daily.    Marland Kitchen atorvastatin (LIPITOR) 10 MG tablet Take 1 tablet (10 mg total) by mouth daily at 6 PM. 30 tablet 4  . Cholecalciferol (VITAMIN D PO) Take 1 tablet by mouth daily.     . clopidogrel (PLAVIX) 75 MG tablet TAKE 1 TABLET BY MOUTH DAILY WITH MEAL  30 tablet 2  . metoprolol succinate (TOPROL-XL) 25 MG 24 hr tablet TAKE 1 TABLET BY MOUTH DAILY. 30 tablet 2  . NITROSTAT 0.4 MG SL tablet Place 0.4 mg under the tongue every 5 (five) minutes as needed for chest pain.     Marland Kitchen Omeprazole Magnesium (PRILOSEC OTC PO) Take 1 tablet by mouth daily.      No current facility-administered medications for this visit.    Allergies:    Allergies  Allergen Reactions  . Fish Oil     Gives a bad feeling    Social History:  The patient  reports that he has never smoked. He has never used smokeless tobacco. He reports that he drinks about 0.6 oz of alcohol per week. He reports that he does not use illicit drugs.   ROS:  Please see the history of present illness.   No neurological complaints. No blood in urine or stool. No episodes of syncope.   All other systems reviewed and negative.   OBJECTIVE: VS:  BP 144/96 mmHg  Pulse 68  Ht 6\' 1"  (1.854 m)  Wt 189 lb 1.9 oz (85.784 kg)  BMI 24.96 kg/m2 Well nourished, well developed, in no acute distress, slender and in no distress HEENT: normal Neck: JVD flat. Carotid bruit absent  Cardiac:  normal S1, S2; RRR; no murmur Lungs:  clear to  auscultation bilaterally, no wheezing, rhonchi or rales Abd: soft, nontender, no hepatomegaly Ext: Edema absent. Pulses 2+  Skin: warm and dry Neuro:  CNs 2-12 intact, no focal abnormalities noted  EKG:     left bundle, sinus rhythm at 70, first degree AV block.    Signed, Illene Labrador III, MD 01/29/2014 8:52 AM

## 2014-01-29 NOTE — Patient Instructions (Signed)
Your physician recommends that you continue on your current medications as directed. Please refer to the Current Medication list given to you today.  Your physician has requested that you have an abdominal aorta duplex. During this test, an ultrasound is used to evaluate the aorta. Allow 30 minutes for this exam. Do not eat after midnight the day before and avoid carbonated beverages  Your physician recommends that you return for a FASTING lipid profile and alt: [place] wants you to follow-up in: 1 year with Dr.Smith You will receive a reminder letter in the mail two months in advance. If you don't receive a letter, please call our office to schedule the follow-up appointment.

## 2014-02-05 ENCOUNTER — Ambulatory Visit (HOSPITAL_COMMUNITY): Payer: PRIVATE HEALTH INSURANCE | Attending: Cardiology | Admitting: Cardiology

## 2014-02-05 ENCOUNTER — Other Ambulatory Visit (INDEPENDENT_AMBULATORY_CARE_PROVIDER_SITE_OTHER): Payer: PRIVATE HEALTH INSURANCE | Admitting: *Deleted

## 2014-02-05 DIAGNOSIS — E785 Hyperlipidemia, unspecified: Secondary | ICD-10-CM

## 2014-02-05 DIAGNOSIS — I10 Essential (primary) hypertension: Secondary | ICD-10-CM | POA: Diagnosis not present

## 2014-02-05 DIAGNOSIS — Z136 Encounter for screening for cardiovascular disorders: Secondary | ICD-10-CM | POA: Diagnosis not present

## 2014-02-05 LAB — LIPID PANEL
CHOL/HDL RATIO: 4
CHOLESTEROL: 165 mg/dL (ref 0–200)
HDL: 41.1 mg/dL (ref >39.00)
NonHDL: 123.9
Triglycerides: 302 mg/dL — ABNORMAL HIGH (ref 0.0–149.0)
VLDL: 60.4 mg/dL — ABNORMAL HIGH (ref 0.0–40.0)

## 2014-02-05 LAB — LDL CHOLESTEROL, DIRECT: Direct LDL: 91 mg/dL

## 2014-02-05 LAB — ALT: ALT: 32 U/L (ref 0–53)

## 2014-02-05 NOTE — Progress Notes (Signed)
Abdominal Aorta Duplex performed  

## 2014-02-11 ENCOUNTER — Telehealth: Payer: Self-pay | Admitting: Interventional Cardiology

## 2014-02-11 NOTE — Telephone Encounter (Signed)
New Message  Pt returning Rn's phone call, please call back and discuss.

## 2014-02-11 NOTE — Telephone Encounter (Signed)
Pt aware of AAA duplex results.  No aneurysm noted.  Pt verbalized understanding.

## 2014-02-11 NOTE — Telephone Encounter (Signed)
-----   Message from [place], MD sent at 02/07/2014  9:58 AM EST ----- No aneurysm noted.

## 2014-02-19 ENCOUNTER — Telehealth: Payer: Self-pay | Admitting: Interventional Cardiology

## 2014-02-19 NOTE — Telephone Encounter (Signed)
New Message  Pt calling about test results. Please call back and discuss.

## 2014-02-19 NOTE — Telephone Encounter (Signed)
Contacted the pt to inform him that per Dr Tamala Julian his triglycerides are high, and he should ut back on any alcohol.  Informed the pt that per Dr Tamala Julian other than his triglycerides, his cholesterol is okay. Pt verbalized understanding and agrees with this plan.

## 2014-02-20 ENCOUNTER — Telehealth: Payer: Self-pay | Admitting: Interventional Cardiology

## 2014-02-20 DIAGNOSIS — E785 Hyperlipidemia, unspecified: Secondary | ICD-10-CM

## 2014-02-20 NOTE — Telephone Encounter (Addendum)
New message      Pt want to talk to Dr Tamala Julian.  He received his triglycerides results and they are still high. Normally he would talk to Ysidro Evert but pt want to talk to Dr Tamala Julian.  OK to call anytime today

## 2014-02-23 NOTE — Telephone Encounter (Signed)
Start Omega 3 fish oil 21 gm BID Limit any alcohol F/U fasting lipid 6-8 weeks

## 2014-02-24 MED ORDER — OMEGA-3 FISH OIL 1000 MG PO CAPS: ORAL_CAPSULE | ORAL | Status: DC

## 2014-02-24 NOTE — Telephone Encounter (Signed)
There was a typo on Omega 3 recommendation. Should be 2 gms daily (1 gm BID). Avoid wine for this time and lets see what happens.

## 2014-02-24 NOTE — Telephone Encounter (Signed)
Pt aware of Dr.Smith's recommendation. Start Omega 3 fish oil 21 gm BID Limit any alcohol F/U fasting lipid 6-8 weeks  pt aware lab appt scheduled on 3/23    he was adv by his cardiac surgeon to drink 4-6 oz of red wine daily. He would like to know if that is acceptable. Adv him a will fwd the question to Dr.Smith and call back with his response.

## 2014-02-25 MED ORDER — OMEGA-3 FISH OIL 1000 MG PO CAPS: 1.0000 | ORAL_CAPSULE | Freq: Two times a day (BID) | ORAL | Status: DC

## 2014-02-25 NOTE — Telephone Encounter (Signed)
Pt aware and verbalized understanding.  

## 2014-03-16 ENCOUNTER — Other Ambulatory Visit: Payer: Self-pay | Admitting: Interventional Cardiology

## 2014-03-27 ENCOUNTER — Other Ambulatory Visit: Payer: Self-pay | Admitting: Interventional Cardiology

## 2014-04-01 ENCOUNTER — Telehealth: Payer: Self-pay | Admitting: Interventional Cardiology

## 2014-04-01 ENCOUNTER — Other Ambulatory Visit (INDEPENDENT_AMBULATORY_CARE_PROVIDER_SITE_OTHER): Payer: PRIVATE HEALTH INSURANCE | Admitting: *Deleted

## 2014-04-01 DIAGNOSIS — E785 Hyperlipidemia, unspecified: Secondary | ICD-10-CM

## 2014-04-01 LAB — LIPID PANEL
Cholesterol: 155 mg/dL (ref 0–200)
HDL: 43.6 mg/dL (ref >39.00)
LDL CALC: 82 mg/dL (ref 0–99)
NonHDL: 111.4
Total CHOL/HDL Ratio: 4
Triglycerides: 146 mg/dL (ref 0.0–149.0)
VLDL: 29.2 mg/dL (ref 0.0–40.0)

## 2014-04-01 LAB — ALT: ALT: 28 U/L (ref 0–53)

## 2014-04-01 NOTE — Addendum Note (Signed)
Addended by: Eulis Foster on: 04/01/2014 08:02 AM   Modules accepted: Orders

## 2014-04-01 NOTE — Telephone Encounter (Signed)
New Msg       Pt request to call back.     No details given.

## 2014-04-02 NOTE — Telephone Encounter (Signed)
returned pt call. lmtcb 

## 2014-04-15 ENCOUNTER — Other Ambulatory Visit: Payer: Self-pay | Admitting: Interventional Cardiology

## 2014-11-30 ENCOUNTER — Other Ambulatory Visit: Payer: Self-pay | Admitting: Interventional Cardiology

## 2015-01-30 ENCOUNTER — Other Ambulatory Visit: Payer: Self-pay | Admitting: Interventional Cardiology

## 2015-02-02 ENCOUNTER — Other Ambulatory Visit: Payer: Self-pay | Admitting: Interventional Cardiology

## 2015-02-02 NOTE — Telephone Encounter (Signed)
Medication Detail      Disp Refills Start End     metoprolol succinate (TOPROL-XL) 25 MG 24 hr tablet 30 tablet 1 02/01/2015     Sig: TAKE 1 TABLET BY MOUTH DAILY.    E-Prescribing Status: Receipt confirmed by pharmacy (02/01/2015 9:37 AM EST)     Pharmacy    [facility] # [place], [place]

## 2015-02-22 ENCOUNTER — Encounter: Payer: Self-pay | Admitting: Interventional Cardiology

## 2015-02-23 ENCOUNTER — Ambulatory Visit (INDEPENDENT_AMBULATORY_CARE_PROVIDER_SITE_OTHER): Payer: Managed Care, Other (non HMO) | Admitting: Gastroenterology

## 2015-02-23 ENCOUNTER — Encounter: Payer: Self-pay | Admitting: Gastroenterology

## 2015-02-23 VITALS — BP 120/82 | HR 65 | Ht 73.0 in | Wt 184.0 lb

## 2015-02-23 DIAGNOSIS — K219 Gastro-esophageal reflux disease without esophagitis: Secondary | ICD-10-CM | POA: Diagnosis not present

## 2015-02-23 MED ORDER — OMEPRAZOLE 40 MG PO CPDR: 40.0000 mg | DELAYED_RELEASE_CAPSULE | Freq: Every day | ORAL | Status: DC

## 2015-02-23 NOTE — Progress Notes (Signed)
Review of pertinent gastrointestinal problems: 1. Father had colon cancer (was 56 at diagnosis): Colonoscopy 07/2007 Ardis Hughs, was normal, recommended recall colonoscopy at 5 years.  Colonoscopy 2014, Dr. Ardis Hughs, found diverticulosis but was otherwise normal. Recommended repeat colonoscopy at five-year interval  HPI: This is a    very pleasant 65 year old man    who was referred to me by Stefanie Libel, MD  to evaluate  reflux, indigestion, .    Chief complaint is reflux, indigestion.  Has constant indigestion.  Takes prilosec every AM (OTC) and cannot eat unless he does.  Laying on right side at bedtime he will have solid regurge and acid regurge. Anytime he lays on his right side.    His weight is stable.   No dysphagia.  Trouble with spicy foods, pepper.    Takes ibuprofen 2-3 per day.  Also takes ASA once daily, 81mg .   Review of systems: Pertinent positive and negative review of systems were noted in the above HPI section. Complete review of systems was performed and was otherwise normal.   Past Medical History  Diagnosis Date  . Vertigo   . GERD (gastroesophageal reflux disease)   . Blood transfusion without reported diagnosis   . CAD (coronary artery disease)     with CABG LIMA to LAD, free radial PDA, SVG to diagonal, SVG to OM. 2002. DES May 2011 and 01-2010 to SVG of Diag, native OM, and Native RCA. Unable to stent LAD via LIMA  . Hyperlipidemia     unable to tolarate statin therapy  . Kidney stone     Past Surgical History  Procedure Laterality Date  . Coronary artery bypass graft    . Cholecystectomy    . Colonoscopy      Current Outpatient Prescriptions  Medication Sig Dispense Refill  . aspirin 81 MG tablet Take 81 mg by mouth daily.    Marland Kitchen atorvastatin (LIPITOR) 10 MG tablet TAKE 1 TABLET BY MOUTH DAILY AT 6 PM. (Patient taking differently: TAKE 1/2 (5mg ) TABLET BY MOUTH DAILY AT 6 PM.) 30 tablet 11  . Cholecalciferol (VITAMIN D PO) Take 1 tablet by mouth  daily.     . clopidogrel (PLAVIX) 75 MG tablet TAKE 1 TABLET BY MOUTH DAILY WITH MEAL 30 tablet 1  . ibuprofen (ADVIL,MOTRIN) 200 MG tablet Take 200 mg by mouth every 6 (six) hours as needed.    . metoprolol succinate (TOPROL-XL) 25 MG 24 hr tablet TAKE 1 TABLET BY MOUTH DAILY. 30 tablet 1  . niacinamide 100 MG tablet Take 100 mg by mouth daily.    Marland Kitchen NITROSTAT 0.4 MG SL tablet PLACE 1 TABLET UNDER THE TONGUE AS NEEDED 25 tablet 2  . Omeprazole Magnesium (PRILOSEC OTC PO) Take 1 tablet by mouth daily.     . vitamin C (ASCORBIC ACID) 500 MG tablet Take 500 mg by mouth daily.    . Zinc 50 MG TABS Take 1 tablet by mouth daily.     No current facility-administered medications for this visit.    Allergies as of 02/23/2015 - Review Complete 02/23/2015  Allergen Reaction Noted  . Fish oil  07/03/2013    Family History  Problem Relation Age of Onset  . Colon cancer Father   . Sudden death Neg Hx   . Hypertension Neg Hx   . Hyperlipidemia Neg Hx   . Heart attack Neg Hx   . Diabetes Neg Hx   . Rectal cancer Neg Hx   . Stomach cancer Neg Hx  Social History   Social History  . Marital Status: Married    Spouse Name: N/A  . Number of Children: N/A  . Years of Education: N/A   Occupational History  . Not on file.   Social History Main Topics  . Smoking status: Never Smoker   . Smokeless tobacco: Never Used  . Alcohol Use: 0.6 oz/week    1 Glasses of wine per week     Comment: prn  . Drug Use: No  . Sexual Activity: Not on file   Other Topics Concern  . Not on file   Social History Narrative     Physical Exam: BP 120/82 mmHg  Pulse 65  Ht 6\' 1"  (1.854 m)  Wt 184 lb (83.462 kg)  BMI 24.28 kg/m2 Constitutional: generally well-appearing Psychiatric: alert and oriented x3 Eyes: extraocular movements intact Mouth: oral pharynx moist, no lesions Neck: supple no lymphadenopathy Cardiovascular: heart regular rate and rhythm Lungs: clear to auscultation  bilaterally Abdomen: soft, nontender, nondistended, no obvious ascites, no peritoneal signs, normal bowel sounds Extremities: no lower extremity edema bilaterally Skin: no lesions on visible extremities   Assessment and plan: 65 y.o. male with  reflux, indigestion, regurgitation at night usually  He takes 2-3 NSAIDs daily and a baby aspirin every day. This may be causing peptic ulcer disease, significant gastritis. He may also have an issue with acid regurg and given his regurgitation of liquids and solids at night I wonder about a large hiatal hernia as well. I recommended that we increase his proton pump inhibitor to prescription strength and that he take it daily 20-30 minutes before his breakfast meal. He will also begin H2 blocker at bedtime every night and we'll proceed with EGD at his soonest convenience.   Owens Loffler, MD [facility] 02/23/2015, 3:26 PM  Cc: Stefanie Libel, MD

## 2015-02-23 NOTE — Patient Instructions (Signed)
Omeprazole 40mg  pill, before BF daily. Ranitidine 150mg  (OTC) at bedtime nightly. You will be set up for an upper endoscopy for relux, ?gastritis, ulcers?Marland Kitchen

## 2015-02-25 ENCOUNTER — Encounter: Payer: Self-pay | Admitting: Interventional Cardiology

## 2015-03-03 ENCOUNTER — Other Ambulatory Visit (INDEPENDENT_AMBULATORY_CARE_PROVIDER_SITE_OTHER): Payer: Managed Care, Other (non HMO) | Admitting: *Deleted

## 2015-03-03 DIAGNOSIS — I10 Essential (primary) hypertension: Secondary | ICD-10-CM

## 2015-03-03 LAB — BASIC METABOLIC PANEL
BUN: 21 mg/dL (ref 7–25)
CHLORIDE: 103 mmol/L (ref 98–110)
CO2: 25 mmol/L (ref 20–31)
Calcium: 9.2 mg/dL (ref 8.6–10.3)
Creat: 1.03 mg/dL (ref 0.70–1.25)
GLUCOSE: 106 mg/dL — AB (ref 65–99)
POTASSIUM: 4.1 mmol/L (ref 3.5–5.3)
Sodium: 138 mmol/L (ref 135–146)

## 2015-03-03 LAB — HEPATIC FUNCTION PANEL
ALT: 24 U/L (ref 9–46)
AST: 20 U/L (ref 10–35)
Albumin: 4.3 g/dL (ref 3.6–5.1)
Alkaline Phosphatase: 67 U/L (ref 40–115)
BILIRUBIN DIRECT: 0.2 mg/dL (ref <=0.2)
BILIRUBIN INDIRECT: 0.4 mg/dL (ref 0.2–1.2)
BILIRUBIN TOTAL: 0.6 mg/dL (ref 0.2–1.2)
TOTAL PROTEIN: 7.1 g/dL (ref 6.1–8.1)

## 2015-03-03 LAB — LIPID PANEL
Cholesterol: 127 mg/dL (ref 125–200)
HDL: 38 mg/dL — ABNORMAL LOW (ref >=40)
LDL CALC: 67 mg/dL (ref <130)
Total CHOL/HDL Ratio: 3.3 Ratio (ref <=5.0)
Triglycerides: 108 mg/dL (ref <150)
VLDL: 22 mg/dL (ref <30)

## 2015-03-04 ENCOUNTER — Encounter: Payer: Self-pay | Admitting: Interventional Cardiology

## 2015-03-04 ENCOUNTER — Ambulatory Visit (INDEPENDENT_AMBULATORY_CARE_PROVIDER_SITE_OTHER): Payer: Managed Care, Other (non HMO) | Admitting: Interventional Cardiology

## 2015-03-04 VITALS — BP 130/90 | HR 88 | Ht 73.0 in | Wt 186.1 lb

## 2015-03-04 DIAGNOSIS — I25709 Atherosclerosis of coronary artery bypass graft(s), unspecified, with unspecified angina pectoris: Secondary | ICD-10-CM | POA: Diagnosis not present

## 2015-03-04 DIAGNOSIS — I10 Essential (primary) hypertension: Secondary | ICD-10-CM

## 2015-03-04 DIAGNOSIS — E785 Hyperlipidemia, unspecified: Secondary | ICD-10-CM | POA: Diagnosis not present

## 2015-03-04 NOTE — Patient Instructions (Signed)

## 2015-03-04 NOTE — Progress Notes (Signed)
Cardiology Office Note   Date:  03/04/2015   ID:  Tommy [name], DOB [birth year], MRN [id number]  PCP:  Tommy Libel, MD  Cardiologist:  Sinclair Grooms, MD   Chief Complaint  Patient presents with  . Coronary Artery Disease      History of Present Illness: Tommy [name] is a 65 y.o. male who presents for  CAD, prior bypass surgery and post bypass stenting, hyperlipidemia, and reflux.   [name] is doing well. He runs about 30 miles per week. His weight is been under excellent control. He is stop drinking wine. Recent lipid profile was excellent. He is not having angina. He denies dyspnea palpitations.    Past Medical History  Diagnosis Date  . Vertigo   . GERD (gastroesophageal reflux disease)   . Blood transfusion without reported diagnosis   . CAD (coronary artery disease)     with CABG LIMA to LAD, free radial PDA, SVG to diagonal, SVG to OM. 2002. DES May 2011 and 01-2010 to SVG of Diag, native OM, and Native RCA. Unable to stent LAD via LIMA  . Hyperlipidemia     unable to tolarate statin therapy  . Kidney stone     Past Surgical History  Procedure Laterality Date  . Coronary artery bypass graft    . Cholecystectomy    . Colonoscopy       Current Outpatient Prescriptions  Medication Sig Dispense Refill  . aspirin 81 MG tablet Take 81 mg by mouth daily.    Marland Kitchen atorvastatin (LIPITOR) 10 MG tablet Take 5 mg by mouth daily.    . Cholecalciferol (VITAMIN D PO) Take 1 tablet by mouth daily.     . clopidogrel (PLAVIX) 75 MG tablet TAKE 1 TABLET BY MOUTH DAILY WITH MEAL 30 tablet 1  . finasteride (PROPECIA) 1 MG tablet Take 1 mg by mouth daily.    . metoprolol succinate (TOPROL-XL) 25 MG 24 hr tablet TAKE 1 TABLET BY MOUTH DAILY. 30 tablet 1  . niacinamide 100 MG tablet Take 100 mg by mouth daily.    Marland Kitchen NITROSTAT 0.4 MG SL tablet PLACE 1 TABLET UNDER THE TONGUE AS NEEDED 25 tablet 2  . omeprazole (PRILOSEC) 40 MG capsule Take 1 capsule (40 mg total) by mouth daily.  90 capsule 3  . vitamin C (ASCORBIC ACID) 500 MG tablet Take 500 mg by mouth daily.    . Zinc 50 MG TABS Take 1 tablet by mouth daily.     No current facility-administered medications for this visit.    Allergies:   Fish oil    Social History:  The patient  reports that he has never smoked. He has never used smokeless tobacco. He reports that he drinks about 0.6 oz of alcohol per week. He reports that he does not use illicit drugs.   Family History:  The patient's family history includes Colon cancer in his father. There is no history of Sudden death, Hypertension, Hyperlipidemia, Heart attack, Diabetes, Rectal cancer, or Stomach cancer.    ROS:  Please see the history of present illness.   Otherwise, review of systems are positive for  Knee and hip discomfort especially after running. Reflux an regurgitation when supine..   All other systems are reviewed and negative.    PHYSICAL EXAM: VS:  BP 130/90 mmHg  Pulse 88  Ht 6\' 1"  (1.854 m)  Wt 186 lb 1.9 oz (84.423 kg)  BMI 24.56 kg/m2 , BMI Body mass index is 24.56 kg/(m^2).  GEN: Well nourished, well developed, in no acute distress HEENT: normal Neck: no JVD, carotid bruits, or masses Cardiac: RRR.  There is no murmur, rub, or gallop. There is no edema. Respiratory:  clear to auscultation bilaterally, normal work of breathing. GI: soft, nontender, nondistended, + BS MS: no deformity or atrophy Skin: warm and dry, no rash Neuro:  Strength and sensation are intact Psych: euthymic mood, full affect   EKG:  EKG is ordered today. The ekg reveals  Normal sinus rhythm, left bundle branch block , unchanged from prior.   Recent Labs: 03/03/2015: ALT 24; BUN 21; Creat 1.03; Potassium 4.1; Sodium 138    Lipid Panel    Component Value Date/Time   CHOL 127 03/03/2015 0744   TRIG 108 03/03/2015 0744   HDL 38* 03/03/2015 0744   CHOLHDL 3.3 03/03/2015 0744   VLDL 22 03/03/2015 0744   LDLCALC 67 03/03/2015 0744   LDLDIRECT 91.0  02/05/2014 0809      Wt Readings from Last 3 Encounters:  03/04/15 186 lb 1.9 oz (84.423 kg)  02/23/15 184 lb (83.462 kg)  01/29/14 189 lb 1.9 oz (85.784 kg)      Other studies Reviewed: Additional studies/ records that were reviewed today include:  Recent entries in the electronic health records were reviewed.. The findings include  Lipid panel was discussed..    ASSESSMENT AND PLAN:  1. Atherosclerosis of coronary artery bypass graft of native heart with unspecified angina pectoris  absent angina  2. Essential hypertension, benign  excellent control  3. Hyperlipidemia  LDL 67 , HDL 38, total cholesterol 1 29.    Current medicines are reviewed at length with the patient today.  The patient has the following concerns regarding medicines: none.  The following changes/actions have been instituted:     encouraged continued aerobic activity.   Acknowledged significant improvement in dietary program resulting in improvement in lipids.   Lipid and liver panel in one year.   Change in therapy.  Labs/ tests ordered today include:  No orders of the defined types were placed in this encounter.     Disposition:   FU with HS in 1 year  Signed, Sinclair Grooms, MD  03/04/2015 11:04 AM    [address], [place], [place]  [address] Phone: [phone]; Fax: [phone]

## 2015-03-05 ENCOUNTER — Ambulatory Visit: Payer: PRIVATE HEALTH INSURANCE | Admitting: Interventional Cardiology

## 2015-04-02 ENCOUNTER — Encounter: Payer: PRIVATE HEALTH INSURANCE | Admitting: Gastroenterology

## 2015-04-05 ENCOUNTER — Other Ambulatory Visit: Payer: Self-pay | Admitting: Interventional Cardiology

## 2015-04-16 ENCOUNTER — Other Ambulatory Visit: Payer: Self-pay | Admitting: Interventional Cardiology

## 2015-08-25 ENCOUNTER — Ambulatory Visit (INDEPENDENT_AMBULATORY_CARE_PROVIDER_SITE_OTHER): Payer: Medicare Other | Admitting: Family Medicine

## 2015-08-25 ENCOUNTER — Encounter: Payer: Self-pay | Admitting: Family Medicine

## 2015-08-25 DIAGNOSIS — M25562 Pain in left knee: Secondary | ICD-10-CM | POA: Diagnosis present

## 2015-08-25 NOTE — Progress Notes (Signed)
  Tommy [name] - 65 y.o. male MRN [id number]  Date of birth: [birth year]  SUBJECTIVE:  Including CC & ROS.  Chief Complaint  Patient presents with  . Knee Pain   Tommy [name] is a 65 year old male that is presenting with left knee pain. He has been an active runner and runs around 1200 miles per year. He has started playing tennis again as of June. After playing tennis for the third time he started noticing swelling of his left knee. He is still able to run but has some soreness with it. This is been gradual in nature. He has not started any new medicines is not not take any anti-inflammatories. He denies any specific injury to his knee before. He's been playing indoor and outdoor courts. He is a right-handed Firefighter. He has had the vein stripped in his left leg for his heart surgery.  ROS: No unexpected weight loss, fever, chills, instability, muscle pain, numbness/tingling, redness, otherwise see HPI    HISTORY: Past Medical, Surgical, Social, and Family History Reviewed & Updated per EMR.   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  Psych:  alert and oriented Knee: Noticeable swelling of the left knee. No significant tenderness to palpation of the medial lateral joint line. Palpation normal with no warmth, joint line tenderness, patellar tenderness, or condyle tenderness. ROM full in flexion and extension and lower leg  rotation. Ligaments with solid consistent endpoints including LCL, MCL. Positive Thess[place]onian tests. Non painful patellar compression. Patellar glide without crepitus. Patellar and quadriceps tendons unremarkable. Hamstring and quadriceps strength is normal.   Limited ultrasound: Left knee: Mild effusion noted in the suprapatellar pouch. Quadricep and patellar tendon intact. Lateral meniscus and medial meniscus with mild hypoechoic change suggesting a degenerative change.  ASSESSMENT & PLAN:   Left knee pain Most likely his pain is associated with degenerative meniscal injury. This is as evidence with the fluid accumulation in his left knee. Most likely associated with the twisting in tennis. - Provided a body helix for his left knee. - Encouraged to follow-up as needed

## 2015-08-27 DIAGNOSIS — M25562 Pain in left knee: Secondary | ICD-10-CM | POA: Insufficient documentation

## 2015-08-27 NOTE — Assessment & Plan Note (Signed)
Most likely his pain is associated with degenerative meniscal injury. This is as evidence with the fluid accumulation in his left knee. Most likely associated with the twisting in tennis. - Provided a body helix for his left knee. - Encouraged to follow-up as needed

## 2016-02-08 NOTE — Progress Notes (Signed)
Cardiology Office Note    Date:  02/09/2016   ID:  Tommy [name], DOB [birth year], MRN [id number]  PCP:  No primary care provider on file.  Cardiologist: Tommy Grooms, MD   Chief Complaint  Patient presents with  . Coronary Artery Disease  . Hyperlipidemia    History of Present Illness:  Tommy [name] is a 66 y.o. male  CAD, prior bypass surgery and post bypass stenting(CABG with LIMA to LAD, free radial to PDA, SVG to OM, SVG to diagonal, 2002. DES 05/2009 and January 2012 to SVG diagonal, native RCA, and native OM. Unable to stent the diagonal beyond the LIMA insertion and the LAD), hyperlipidemia, and reflux.  [name] is doing well. He continues exercise. Has some dyspnea with exercise. No significant induction of chest discomfort with activity. He ran 3 miles this morning without trouble. He is not using nitroglycerin. There may be mild orthopnea. No lower extremity swelling.  Past Medical History:  Diagnosis Date  . Blood transfusion without reported diagnosis   . CAD (coronary artery disease)    with CABG LIMA to LAD, free radial PDA, SVG to diagonal, SVG to OM. 2002. DES May 2011 and 01-2010 to SVG of Diag, native OM, and Native RCA. Unable to stent LAD via LIMA  . GERD (gastroesophageal reflux disease)   . Hyperlipidemia    unable to tolarate statin therapy  . Kidney stone   . Vertigo     Past Surgical History:  Procedure Laterality Date  . CHOLECYSTECTOMY    . COLONOSCOPY    . CORONARY ARTERY BYPASS GRAFT      Current Medications: Outpatient Medications Prior to Visit  Medication Sig Dispense Refill  . aspirin 81 MG tablet Take 81 mg by mouth daily.    Marland Kitchen atorvastatin (LIPITOR) 10 MG tablet Take 5 mg by mouth daily. Take 1/2 pill daily    . Cholecalciferol (VITAMIN D PO) Take 5,000 Units by mouth daily.     . clopidogrel (PLAVIX) 75 MG tablet TAKE 1 TABLET BY MOUTH DAILY WITH MEAL 30 tablet 9  . metoprolol succinate (TOPROL-XL) 25 MG 24 hr tablet TAKE 1  TABLET BY MOUTH DAILY. 30 tablet 10  . NITROSTAT 0.4 MG SL tablet PLACE 1 TABLET UNDER THE TONGUE AS NEEDED 25 tablet 2  . omeprazole (PRILOSEC) 40 MG capsule Take 1 capsule (40 mg total) by mouth daily. 90 capsule 3  . ranitidine (ZANTAC) 75 MG tablet Take 75 mg by mouth daily.     . vitamin C (ASCORBIC ACID) 500 MG tablet Take 500 mg by mouth daily.    . Zinc 50 MG TABS Take 1 tablet by mouth daily.    . niacinamide 100 MG tablet Take 500 mg by mouth daily.      No facility-administered medications prior to visit.      Allergies:   Fish oil   Social History   Social History  . Marital status: Married    Spouse name: N/A  . Number of children: N/A  . Years of education: N/A   Social History Main Topics  . Smoking status: Never Smoker  . Smokeless tobacco: Never Used  . Alcohol use 0.6 oz/week    1 Glasses of wine per week     Comment: prn  . Drug use: No  . Sexual activity: Not Asked   Other Topics Concern  . None   Social History Narrative  . None     Family History:  The patient's family history includes Colon cancer in his father.   ROS:   Please see the history of present illness.    Feels he is having reflux at times. Otherwise no complaints.  All other systems reviewed and are negative.   PHYSICAL EXAM:   VS:  BP (!) 146/88 (BP Location: Right Arm)   Pulse 69   Ht 6' (1.829 m)   Wt 191 lb 9.6 oz (86.9 kg)   BMI 25.99 kg/m    GEN: Well nourished, well developed, in no acute distress  HEENT: normal  Neck: no JVD, carotid bruits, or masses Cardiac: RRR; no murmurs, rubs; no edema . An S4 gallop is audible. Respiratory:  clear to auscultation bilaterally, normal work of breathing GI: soft, nontender, nondistended, + BS MS: no deformity or atrophy  Skin: warm and dry, no rash Neuro:  Alert and Oriented x 3, Strength and sensation are intact Psych: euthymic mood, full affect  Wt Readings from Last 3 Encounters:  02/09/16 191 lb 9.6 oz (86.9 kg)    08/25/15 180 lb (81.6 kg)  03/04/15 186 lb 1.9 oz (84.4 kg)      Studies/Labs Reviewed:   EKG:  EKG  Normal sinus rhythm, left atrial abnormality, rightward axis, left bundle branch block. No change compared to the prior tracing.  Recent Labs: 03/03/2015: ALT 24; BUN 21; Creat 1.03; Potassium 4.1; Sodium 138   Lipid Panel    Component Value Date/Time   CHOL 127 03/03/2015 0744   TRIG 108 03/03/2015 0744   HDL 38 (L) 03/03/2015 0744   CHOLHDL 3.3 03/03/2015 0744   VLDL 22 03/03/2015 0744   LDLCALC 67 03/03/2015 0744   LDLDIRECT 91.0 02/05/2014 0809    Additional studies/ records that were reviewed today include:  No recent functional testing Most recent cardiac catheterization performed in 2012, October: CONCLUSIONS: 1. Moderate to moderately severe in-stent restenosis, mid right     coronary. 2. Widely patent saphenous vein graft to diagonal midbody stent. 3. Widely patent stent in the obtuse marginal #1. 4. Patent saphenous vein graft to obtuse marginal #2. 5. Patent left internal mammary to LAD with high-grade distal native     LAD disease beyond the graft insertion site. 6. 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 7. Normal LV function.   ASSESSMENT:    1. Atherosclerosis of native coronary artery of native heart without angina pectoris   2. HYPERTENSION, BENIGN SYSTEMIC   3. Ectopic atrial tachycardia ([place])   4. Left bundle branch block   5. Other hyperlipidemia      PLAN:  In order of problems listed above:  1. Some symptoms perhaps suggestive of angina. More dyspnea with physical activity raises question of systolic dysfunction. I'm thus especially concerned about this in the setting of left bundle branch block. 2. Upper normal blood pressure. Low salt diet 3. Still having ectopic beats from time to time but overall doing well. 4. No change in the appearance of tracing.  We will check lipids today. We will do an echocardiogram to assess  LV function. No change in physical activity. Overall stable.    Medication Adjustments/Labs and Tests Ordered: Current medicines are reviewed at length with the patient today.  Concerns regarding medicines are outlined above.  Medication changes, Labs and Tests ordered today are listed in the Patient Instructions below. Patient Instructions  Medication Instructions:  None  Labwork: Lipids and liver today  Testing/Procedures: Your physician has requested that you have an  echocardiogram. Echocardiography is a painless test that uses sound waves to create images of your heart. It provides your doctor with information about the size and shape of your heart and how well your heart's chambers and valves are working. This procedure takes approximately one hour. There are no restrictions for this procedure.    Follow-Up: Your physician wants you to follow-up in: 1 year with Dr. Tamala Julian.  You will receive a reminder letter in the mail two months in advance. If you don't receive a letter, please call our office to schedule the follow-up appointment.    Any Other Special Instructions Will Be Listed Below (If Applicable).     If you need a refill on your cardiac medications before your next appointment, please call your pharmacy.      Signed, Tommy Grooms, MD  02/09/2016 11:21 AM    [facility] [place], [place],   [address] Phone: [phone]; Fax: [phone]

## 2016-02-09 ENCOUNTER — Encounter: Payer: Self-pay | Admitting: Interventional Cardiology

## 2016-02-09 ENCOUNTER — Ambulatory Visit (INDEPENDENT_AMBULATORY_CARE_PROVIDER_SITE_OTHER): Payer: Medicare Other | Admitting: Interventional Cardiology

## 2016-02-09 VITALS — BP 146/88 | HR 69 | Ht 72.0 in | Wt 191.6 lb

## 2016-02-09 DIAGNOSIS — I10 Essential (primary) hypertension: Secondary | ICD-10-CM | POA: Diagnosis not present

## 2016-02-09 DIAGNOSIS — I447 Left bundle-branch block, unspecified: Secondary | ICD-10-CM | POA: Diagnosis not present

## 2016-02-09 DIAGNOSIS — I471 Supraventricular tachycardia: Secondary | ICD-10-CM

## 2016-02-09 DIAGNOSIS — I2510 Atherosclerotic heart disease of native coronary artery without angina pectoris: Secondary | ICD-10-CM

## 2016-02-09 DIAGNOSIS — E784 Other hyperlipidemia: Secondary | ICD-10-CM

## 2016-02-09 DIAGNOSIS — E7849 Other hyperlipidemia: Secondary | ICD-10-CM

## 2016-02-09 LAB — LIPID PANEL
CHOL/HDL RATIO: 3.6 ratio (ref 0.0–5.0)
Cholesterol, Total: 164 mg/dL (ref 100–199)
HDL: 45 mg/dL (ref >39)
LDL Calculated: 87 mg/dL (ref 0–99)
Triglycerides: 162 mg/dL — ABNORMAL HIGH (ref 0–149)
VLDL CHOLESTEROL CAL: 32 mg/dL (ref 5–40)

## 2016-02-09 LAB — HEPATIC FUNCTION PANEL
ALT: 50 IU/L — ABNORMAL HIGH (ref 0–44)
AST: 27 IU/L (ref 0–40)
Albumin: 4.6 g/dL (ref 3.6–4.8)
Alkaline Phosphatase: 100 IU/L (ref 39–117)
BILIRUBIN, DIRECT: 0.15 mg/dL (ref 0.00–0.40)
Bilirubin Total: 0.6 mg/dL (ref 0.0–1.2)
Total Protein: 6.8 g/dL (ref 6.0–8.5)

## 2016-02-09 NOTE — Patient Instructions (Signed)
Medication Instructions:  None  Labwork: Lipids and liver today  Testing/Procedures: Your physician has requested that you have an echocardiogram. Echocardiography is a painless test that uses sound waves to create images of your heart. It provides your doctor with information about the size and shape of your heart and how well your heart's chambers and valves are working. This procedure takes approximately one hour. There are no restrictions for this procedure.    Follow-Up: Your physician wants you to follow-up in: 1 year with Dr. Tamala Julian.  You will receive a reminder letter in the mail two months in advance. If you don't receive a letter, please call our office to schedule the follow-up appointment.    Any Other Special Instructions Will Be Listed Below (If Applicable).     If you need a refill on your cardiac medications before your next appointment, please call your pharmacy.

## 2016-02-22 ENCOUNTER — Other Ambulatory Visit: Payer: Self-pay

## 2016-02-22 ENCOUNTER — Ambulatory Visit (HOSPITAL_COMMUNITY): Payer: Medicare Other | Attending: Cardiology

## 2016-02-22 ENCOUNTER — Encounter (HOSPITAL_COMMUNITY): Payer: Self-pay | Admitting: Cardiology

## 2016-02-22 DIAGNOSIS — I447 Left bundle-branch block, unspecified: Secondary | ICD-10-CM | POA: Diagnosis not present

## 2016-02-22 DIAGNOSIS — I2510 Atherosclerotic heart disease of native coronary artery without angina pectoris: Secondary | ICD-10-CM | POA: Insufficient documentation

## 2016-02-22 DIAGNOSIS — I340 Nonrheumatic mitral (valve) insufficiency: Secondary | ICD-10-CM | POA: Diagnosis not present

## 2016-02-22 DIAGNOSIS — I071 Rheumatic tricuspid insufficiency: Secondary | ICD-10-CM | POA: Diagnosis not present

## 2016-02-22 DIAGNOSIS — E785 Hyperlipidemia, unspecified: Secondary | ICD-10-CM | POA: Diagnosis not present

## 2016-02-22 NOTE — Progress Notes (Signed)
Low ejection function noted in echocardiogram. Spoke with Dr. Acie Fredrickson (DOD) regarding findings and call reading Cardiologist Dr. Stanford Breed to read the echocardiogram. Patient was asymptomatic during exam. Dr. Acie Fredrickson wanted patient seen this week. Patient is scheduled with a PA on Thursday, February 24, 2016 at 1030. Patient was discharged with no symptoms.

## 2016-02-23 ENCOUNTER — Telehealth: Payer: Self-pay | Admitting: Interventional Cardiology

## 2016-02-23 ENCOUNTER — Other Ambulatory Visit: Payer: Self-pay | Admitting: Cardiovascular Disease

## 2016-02-23 MED ORDER — CARVEDILOL 6.25 MG PO TABS: 6.2500 mg | ORAL_TABLET | Freq: Two times a day (BID) | ORAL | 3 refills | Status: DC

## 2016-02-23 NOTE — Progress Notes (Signed)
This appointment is not necessary. Let Tommy [name] get him in with me. I have sent her a message.

## 2016-02-23 NOTE — Telephone Encounter (Signed)
New Message    Pt is calling back for his results. He states that he was called about the results but missed the call

## 2016-02-23 NOTE — Telephone Encounter (Signed)
Tommy Crome, MD  Loren Racer, LPN        Please let Mrs. Rousselle know that his heart now has decreased pumping ability. Ejection fraction 20-25% compared with normal of 55% in 2012. I sent an earlier note concerning switching metoprolol to carvedilol 6.25 mg twice a day. After one week if he feels well, Entresto 24/26 mg twice a day should be started. A basic metabolic panel needs to be performed one week starting Entresto. He needs an office visit to follow up with me 1 week after starting Entresto.    Spoke with pt and went over recommendations per Dr. Tamala Julian. Pt agreeable to switch to carvedilol from metoprolol. Pt would like to keep appt on Monday for now to discuss echo and Entresto. Advised pt this will be fine. Pt very appreciative for assistance.

## 2016-02-23 NOTE — Progress Notes (Signed)
The echocardiogram reveals the ejection fraction is 20-25%. Normal is greater than 50%. His last evaluation of LV function was normal at the time of cath in 2012 for a half was 55%. The suspected explanation for decrease in function is occlusion of native vessels or bypass grafts. He will need to have a another heart catheterization done after we adjust medications appropriately.   Informed pt of results.  Changed appt to 02/28/16 with Dr. Tamala Julian.  Pt appreciative for call.

## 2016-02-24 ENCOUNTER — Ambulatory Visit: Payer: Medicare Other | Admitting: Cardiology

## 2016-02-24 DIAGNOSIS — N400 Enlarged prostate without lower urinary tract symptoms: Secondary | ICD-10-CM | POA: Diagnosis not present

## 2016-02-24 DIAGNOSIS — L659 Nonscarring hair loss, unspecified: Secondary | ICD-10-CM | POA: Diagnosis not present

## 2016-02-24 DIAGNOSIS — E782 Mixed hyperlipidemia: Secondary | ICD-10-CM | POA: Diagnosis not present

## 2016-02-24 DIAGNOSIS — R7301 Impaired fasting glucose: Secondary | ICD-10-CM | POA: Diagnosis not present

## 2016-02-24 DIAGNOSIS — I2510 Atherosclerotic heart disease of native coronary artery without angina pectoris: Secondary | ICD-10-CM | POA: Diagnosis not present

## 2016-02-25 ENCOUNTER — Encounter: Payer: Self-pay | Admitting: Interventional Cardiology

## 2016-02-27 DIAGNOSIS — I5022 Chronic systolic (congestive) heart failure: Secondary | ICD-10-CM | POA: Insufficient documentation

## 2016-02-27 DIAGNOSIS — I38 Endocarditis, valve unspecified: Secondary | ICD-10-CM | POA: Insufficient documentation

## 2016-02-28 ENCOUNTER — Ambulatory Visit (INDEPENDENT_AMBULATORY_CARE_PROVIDER_SITE_OTHER): Payer: Medicare Other | Admitting: Interventional Cardiology

## 2016-02-28 ENCOUNTER — Encounter: Payer: Self-pay | Admitting: Interventional Cardiology

## 2016-02-28 VITALS — BP 150/88 | HR 81 | Ht 72.0 in | Wt 189.8 lb

## 2016-02-28 DIAGNOSIS — E7849 Other hyperlipidemia: Secondary | ICD-10-CM

## 2016-02-28 DIAGNOSIS — I10 Essential (primary) hypertension: Secondary | ICD-10-CM | POA: Diagnosis not present

## 2016-02-28 DIAGNOSIS — I471 Supraventricular tachycardia: Secondary | ICD-10-CM

## 2016-02-28 DIAGNOSIS — E784 Other hyperlipidemia: Secondary | ICD-10-CM

## 2016-02-28 DIAGNOSIS — I2510 Atherosclerotic heart disease of native coronary artery without angina pectoris: Secondary | ICD-10-CM | POA: Diagnosis not present

## 2016-02-28 DIAGNOSIS — I5022 Chronic systolic (congestive) heart failure: Secondary | ICD-10-CM | POA: Diagnosis not present

## 2016-02-28 MED ORDER — SACUBITRIL-VALSARTAN 24-26 MG PO TABS: 1.0000 | ORAL_TABLET | Freq: Two times a day (BID) | ORAL | 3 refills | Status: DC

## 2016-02-28 NOTE — Patient Instructions (Signed)
Medication Instructions:  1) On Friday, start Entresto 24/26- one tablet twice daily  Labwork: Your physician recommends that you return for lab work in: 1 week after starting [name] (BMET)   Testing/Procedures: Your physician has requested that you have a cardiac catheterization. Cardiac catheterization is used to diagnose and/or treat various heart conditions. Doctors may recommend this procedure for a number of different reasons. The most common reason is to evaluate chest pain. Chest pain can be a symptom of coronary artery disease (CAD), and cardiac catheterization can show whether plaque is narrowing or blocking your heart's arteries. This procedure is also used to evaluate the valves, as well as measure the blood flow and oxygen levels in different parts of your heart. For further information please visit [email or web address]. Please follow instruction sheet, as given.   Follow-Up: Your physician recommends that you schedule a follow-up appointment after catheterization is done.  Will arrange this once catheterization is scheduled.   Any Other Special Instructions Will Be Listed Below (If Applicable). Please call our office with the best date to have catheterization.    2/21, 2/22 3/6, 3/9, 3/13, 3/20, 3/21   If you need a refill on your cardiac medications before your next appointment, please call your pharmacy.

## 2016-02-28 NOTE — Progress Notes (Signed)
Cardiology Office Note    Date:  02/28/2016   ID:  Tommy [name], DOB 08-18-1950, MRN [id number]  PCP:  No primary care provider on file.  Cardiologist: Sinclair Grooms, MD   Chief Complaint  Patient presents with  . Coronary Artery Disease  . Congestive Heart Failure    History of Present Illness:  Tommy [name] is a 66 y.o. male here for follow-up of chronic coronary artery disease, newly discovered LV systolic dysfunction with EF less than 30%, hypertension, hyperlipidemia, and prior coronary artery bypass grafting. History of chronic left bundle branch block.  Echocardiography performed earlier this year as a routine screen identified significant reduction in LVEF from 45-50% in 2012 by cath to less than 30% by echo. He is a distance runner. He is training for a half marathon. He has not noted increased shortness of breath or other complaints. He did complain of some mild superficial chest discomfort that is present intermittently but not specifically exertion related. This feels nothing like prior anginal discomfort.  Metoprolol was discontinued and carvedilol 6.25 mg twice a day has been started.[name] is scheduled to be started But he has concerns about adding additional medication. He is in today for further discussion.   We discussed the implications of reduced LV function. We discussed the possibility that resynchronization therapy could help. We discussed that the decline in LV function could be related to silent occlusion of native and/or bypass graft vessels. We discussed our goal to improve LV function by guideline mandated heart failure therapy.  Past Medical History:  Diagnosis Date  . Blood transfusion without reported diagnosis   . CAD (coronary artery disease)    with CABG LIMA to LAD, free radial PDA, SVG to diagonal, SVG to OM. 2002. DES May 2011 and 01-2010 to SVG of Diag, native OM, and Native RCA. Unable to stent LAD via LIMA  . GERD (gastroesophageal  reflux disease)   . Hyperlipidemia    unable to tolarate statin therapy  . Kidney stone   . Vertigo     Past Surgical History:  Procedure Laterality Date  . CHOLECYSTECTOMY    . COLONOSCOPY    . CORONARY ARTERY BYPASS GRAFT      Current Medications: Outpatient Medications Prior to Visit  Medication Sig Dispense Refill  . aspirin 81 MG tablet Take 81 mg by mouth daily.    Marland Kitchen atorvastatin (LIPITOR) 10 MG tablet Take 5 mg by mouth daily. Take 1/2 pill daily    . carvedilol (COREG) 6.25 MG tablet Take 1 tablet (6.25 mg total) by mouth 2 (two) times daily. 180 tablet 3  . Cholecalciferol (VITAMIN D PO) Take 5,000 Units by mouth daily.     . clopidogrel (PLAVIX) 75 MG tablet TAKE 1 TABLET BY MOUTH DAILY WITH MEAL 30 tablet 9  . Dextromethorphan-Guaifenesin (MUCINEX DM MAXIMUM STRENGTH) 60-1200 MG TB12 Take 1 tablet by mouth daily as needed (congestion).    . FIBER PO Take 500 mg by mouth daily.    . niacinamide 500 MG tablet Take 500 mg by mouth daily.    Marland Kitchen NITROSTAT 0.4 MG SL tablet PLACE 1 TABLET UNDER THE TONGUE AS NEEDED 25 tablet 2  . omeprazole (PRILOSEC) 40 MG capsule Take 1 capsule (40 mg total) by mouth daily. 90 capsule 3  . ranitidine (ZANTAC) 75 MG tablet Take 75 mg by mouth daily.     . vitamin C (ASCORBIC ACID) 500 MG tablet Take 500 mg by mouth daily.    Marland Kitchen  Zinc 50 MG TABS Take 1 tablet by mouth daily.     No facility-administered medications prior to visit.      Allergies:   Fish oil   Social History   Social History  . Marital status: Married    Spouse name: N/A  . Number of children: N/A  . Years of education: N/A   Social History Main Topics  . Smoking status: Never Smoker  . Smokeless tobacco: Never Used  . Alcohol use 0.6 oz/week    1 Glasses of wine per week     Comment: prn  . Drug use: No  . Sexual activity: Not Asked   Other Topics Concern  . None   Social History Narrative  . None     Family History:  The patient's family history includes  Colon cancer in his father.   ROS:   Please see the history of present illness.    None other than noted  All other systems reviewed and are negative.   PHYSICAL EXAM:   VS:  BP (!) 150/88 (BP Location: Left Arm)   Pulse 81   Ht 6' (1.829 m)   Wt 189 lb 12.8 oz (86.1 kg)   BMI 25.74 kg/m    GEN: Well nourished, well developed, in no acute distress  HEENT: normal  Neck: no JVD, carotid bruits, or masses Cardiac: RRR; no murmurs, rubs, or gallops,no edema  Respiratory:  clear to auscultation bilaterally, normal work of breathing GI: soft, nontender, nondistended, + BS MS: no deformity or atrophy  Skin: warm and dry, no rash Neuro:  Alert and Oriented x 3, Strength and sensation are intact Psych: euthymic mood, full affect  Wt Readings from Last 3 Encounters:  02/28/16 189 lb 12.8 oz (86.1 kg)  02/09/16 191 lb 9.6 oz (86.9 kg)  08/25/15 180 lb (81.6 kg)      Studies/Labs Reviewed:   EKG:  EKG  Not repeated. Review the echocardiogram from January which shows a left bundle with QRS duration 150 ms.  Recent Labs: 03/03/2015: BUN 21; Creat 1.03; Potassium 4.1; Sodium 138 02/09/2016: ALT 50   Lipid Panel    Component Value Date/Time   CHOL 164 02/09/2016 1135   TRIG 162 (H) 02/09/2016 1135   HDL 45 02/09/2016 1135   CHOLHDL 3.6 02/09/2016 1135   CHOLHDL 3.3 03/03/2015 0744   VLDL 22 03/03/2015 0744   LDLCALC 87 02/09/2016 1135   LDLDIRECT 91.0 02/05/2014 0809    Additional studies/ records that were reviewed today include:  Personally reviewed the echocardiogram with the patient. LV enlargement and global left ventricular hypokinesis is noted. There is some septal dyskinesis. EF is in the 20-25% range.   ASSESSMENT:    1. Atherosclerosis of native coronary artery of native heart without angina pectoris   2. Chronic systolic heart failure ([place])   3. HYPERTENSION, BENIGN SYSTEMIC   4. Other hyperlipidemia   5. Ectopic atrial tachycardia (HCC)      PLAN:  In  order of problems listed above: 1. Coronary angiography is recommended to exclude the possibility of graft occlusion and native vessel progression. With decreased LV function he will need both left and right heart cath with coronary angiography. Will be done from the radial/arm approach. 2. New systolic dysfunction. Rule out hibernating myocardium due to silent occlusion of bypass or native vessels. He raises a question about whether there could be stunned myocardium in addition to the hibernating regions because the echo was done shortly after he had  participated in 6 mile run. This was an interesting concept. Of note, he has been exercising without significant dyspnea or limitations. He was training for a half marathon later this month. I have cautioned against running and heavy exercise. It is okay for him to walk for exercise.Entresto 50 mg twice a day will be started initially and uptitrated.       We discussed the eventual need for AICD and/or resynchronization if LV does not improve with medical therapy and potential revascularization. 3. Blood pressures elevated today so adding the additional medication will help to control.  He will need to follow-up with me in approximately 2 weeks after the left and right heart cath.   Medication Adjustments/Labs and Tests Ordered: Current medicines are reviewed at length with the patient today.  Concerns regarding medicines are outlined above.  Medication changes, Labs and Tests ordered today are listed in the Patient Instructions below. There are no Patient Instructions on file for this visit.   Signed, Sinclair Grooms, MD  02/28/2016 3:55 PM    [address], [place], [place]  [address] Phone: [phone]; Fax: [phone]

## 2016-02-29 ENCOUNTER — Encounter: Payer: Self-pay | Admitting: *Deleted

## 2016-02-29 ENCOUNTER — Telehealth: Payer: Self-pay | Admitting: Interventional Cardiology

## 2016-02-29 ENCOUNTER — Encounter: Payer: Self-pay | Admitting: Interventional Cardiology

## 2016-02-29 NOTE — Telephone Encounter (Signed)
Follow up    Pt is returning Jennifer's call about scheduling a Cath

## 2016-02-29 NOTE — Telephone Encounter (Signed)
Spoke with pt and went over cath instructions.  Pt verbalized understanding and was in agreement with this plan.  Pt aware to arrive at hospital at 9:30AM tomorrow.  Also advised pt if any questions please feel free to call back.  Also advised I posted instruction letter to MyChart for him to refer to if needed.  Pt appreciative for assistance.

## 2016-03-01 ENCOUNTER — Other Ambulatory Visit: Payer: Self-pay

## 2016-03-01 ENCOUNTER — Encounter (HOSPITAL_COMMUNITY)
Admission: RE | Disposition: A | Discharge status: Home / Self Care | Payer: Self-pay | Source: Ambulatory Visit | Attending: Interventional Cardiology

## 2016-03-01 ENCOUNTER — Ambulatory Visit (HOSPITAL_COMMUNITY)
Admission: RE | Admit: 2016-03-01 | Discharge: 2016-03-01 | Disposition: A | Discharge status: Home / Self Care | Payer: Medicare Other | Source: Ambulatory Visit | Attending: Interventional Cardiology | Admitting: Interventional Cardiology

## 2016-03-01 DIAGNOSIS — I272 Pulmonary hypertension, unspecified: Secondary | ICD-10-CM | POA: Insufficient documentation

## 2016-03-01 DIAGNOSIS — Z7982 Long term (current) use of aspirin: Secondary | ICD-10-CM | POA: Insufficient documentation

## 2016-03-01 DIAGNOSIS — I447 Left bundle-branch block, unspecified: Secondary | ICD-10-CM | POA: Diagnosis not present

## 2016-03-01 DIAGNOSIS — E784 Other hyperlipidemia: Secondary | ICD-10-CM | POA: Diagnosis not present

## 2016-03-01 DIAGNOSIS — I252 Old myocardial infarction: Secondary | ICD-10-CM | POA: Diagnosis not present

## 2016-03-01 DIAGNOSIS — I471 Supraventricular tachycardia: Secondary | ICD-10-CM | POA: Insufficient documentation

## 2016-03-01 DIAGNOSIS — I110 Hypertensive heart disease with heart failure: Secondary | ICD-10-CM | POA: Insufficient documentation

## 2016-03-01 DIAGNOSIS — Z7902 Long term (current) use of antithrombotics/antiplatelets: Secondary | ICD-10-CM | POA: Diagnosis not present

## 2016-03-01 DIAGNOSIS — I5022 Chronic systolic (congestive) heart failure: Secondary | ICD-10-CM

## 2016-03-01 DIAGNOSIS — I255 Ischemic cardiomyopathy: Secondary | ICD-10-CM | POA: Insufficient documentation

## 2016-03-01 DIAGNOSIS — I38 Endocarditis, valve unspecified: Secondary | ICD-10-CM | POA: Diagnosis present

## 2016-03-01 DIAGNOSIS — K219 Gastro-esophageal reflux disease without esophagitis: Secondary | ICD-10-CM | POA: Diagnosis not present

## 2016-03-01 DIAGNOSIS — I2582 Chronic total occlusion of coronary artery: Secondary | ICD-10-CM | POA: Diagnosis not present

## 2016-03-01 DIAGNOSIS — T82855A Stenosis of coronary artery stent, initial encounter: Secondary | ICD-10-CM | POA: Diagnosis not present

## 2016-03-01 DIAGNOSIS — I10 Essential (primary) hypertension: Secondary | ICD-10-CM | POA: Diagnosis present

## 2016-03-01 DIAGNOSIS — Z955 Presence of coronary angioplasty implant and graft: Secondary | ICD-10-CM | POA: Insufficient documentation

## 2016-03-01 DIAGNOSIS — I25810 Atherosclerosis of coronary artery bypass graft(s) without angina pectoris: Secondary | ICD-10-CM

## 2016-03-01 DIAGNOSIS — Y712 Prosthetic and other implants, materials and accessory cardiovascular devices associated with adverse incidents: Secondary | ICD-10-CM | POA: Insufficient documentation

## 2016-03-01 DIAGNOSIS — I25709 Atherosclerosis of coronary artery bypass graft(s), unspecified, with unspecified angina pectoris: Secondary | ICD-10-CM | POA: Diagnosis present

## 2016-03-01 HISTORY — PX: RIGHT/LEFT HEART CATH AND CORONARY/GRAFT ANGIOGRAPHY: CATH118267

## 2016-03-01 LAB — BASIC METABOLIC PANEL
ANION GAP: 11 (ref 5–15)
Anion gap: 12 (ref 5–15)
BUN: 14 mg/dL (ref 6–20)
BUN: 14 mg/dL (ref 6–20)
CALCIUM: 9.6 mg/dL (ref 8.9–10.3)
CO2: 25 mmol/L (ref 22–32)
CO2: 22 mmol/L (ref 22–32)
CREATININE: 0.96 mg/dL (ref 0.61–1.24)
Calcium: 9.6 mg/dL (ref 8.9–10.3)
Chloride: 103 mmol/L (ref 101–111)
Chloride: 107 mmol/L (ref 101–111)
Creatinine, Ser: 1.06 mg/dL (ref 0.61–1.24)
GFR calc Af Amer: >60 mL/min (ref >60)
GFR calc Af Amer: >60 mL/min (ref >60)
GFR calc non Af Amer: >60 mL/min (ref >60)
GLUCOSE: 116 mg/dL — AB (ref 65–99)
GLUCOSE: 98 mg/dL (ref 65–99)
Potassium: 5.9 mmol/L — ABNORMAL HIGH (ref 3.5–5.1)
Potassium: 3.7 mmol/L (ref 3.5–5.1)
SODIUM: 141 mmol/L (ref 135–145)
Sodium: 139 mmol/L (ref 135–145)

## 2016-03-01 LAB — CBC
HCT: 45.8 % (ref 39.0–52.0)
Hemoglobin: 16.1 g/dL (ref 13.0–17.0)
MCH: 31.1 pg (ref 26.0–34.0)
MCHC: 35.2 g/dL (ref 30.0–36.0)
MCV: 88.6 fL (ref 78.0–100.0)
PLATELETS: 324 10*3/uL (ref 150–400)
RBC: 5.17 MIL/uL (ref 4.22–5.81)
RDW: 12.4 % (ref 11.5–15.5)
WBC: 8.6 10*3/uL (ref 4.0–10.5)

## 2016-03-01 LAB — POCT I-STAT 3, VENOUS BLOOD GAS (G3P V)
Acid-base deficit: 3 mmol/L — ABNORMAL HIGH (ref 0.0–2.0)
Bicarbonate: 22.8 mmol/L (ref 20.0–28.0)
O2 SAT: 76 %
PCO2 VEN: 40.6 mmHg — AB (ref 44.0–60.0)
PO2 VEN: 42 mmHg (ref 32.0–45.0)
TCO2: 24 mmol/L (ref 0–100)
pH, Ven: 7.357 (ref 7.250–7.430)

## 2016-03-01 LAB — POCT I-STAT 3, ART BLOOD GAS (G3+)
Acid-base deficit: 2 mmol/L (ref 0.0–2.0)
Bicarbonate: 22.6 mmol/L (ref 20.0–28.0)
O2 Saturation: 99 %
PCO2 ART: 37.9 mmHg (ref 32.0–48.0)
PH ART: 7.383 (ref 7.350–7.450)
TCO2: 24 mmol/L (ref 0–100)
pO2, Arterial: 139 mmHg — ABNORMAL HIGH (ref 83.0–108.0)

## 2016-03-01 LAB — PROTIME-INR
INR: 1.03
PROTHROMBIN TIME: 13.5 s (ref 11.4–15.2)

## 2016-03-01 SURGERY — RIGHT/LEFT HEART CATH AND CORONARY/GRAFT ANGIOGRAPHY

## 2016-03-01 MED ORDER — IOPAMIDOL (ISOVUE-370) INJECTION 76%
INTRAVENOUS | Status: DC | PRN
  Administered 2016-03-01: 140 mL via INTRAVENOUS

## 2016-03-01 MED ORDER — IOPAMIDOL (ISOVUE-370) INJECTION 76%
INTRAVENOUS | Status: AC
  Filled 2016-03-01: qty 50

## 2016-03-01 MED ORDER — ACETAMINOPHEN 325 MG PO TABS
ORAL_TABLET | ORAL | Status: AC
  Filled 2016-03-01: qty 2

## 2016-03-01 MED ORDER — SODIUM CHLORIDE 0.9% FLUSH
3.0000 mL | INTRAVENOUS | Status: DC | PRN
Start: 2016-03-01 — End: 2016-03-01

## 2016-03-01 MED ORDER — HYDRALAZINE HCL 20 MG/ML IJ SOLN
INTRAMUSCULAR | Status: AC
  Filled 2016-03-01: qty 1

## 2016-03-01 MED ORDER — SODIUM CHLORIDE 0.9% FLUSH: 3.0000 mL | Freq: Two times a day (BID) | INTRAVENOUS | Status: DC

## 2016-03-01 MED ORDER — CLOPIDOGREL BISULFATE 75 MG PO TABS
ORAL_TABLET | ORAL | Status: AC
  Administered 2016-03-01: 75 mg via ORAL
  Filled 2016-03-01: qty 1

## 2016-03-01 MED ORDER — LIDOCAINE HCL (PF) 1 % IJ SOLN
INTRAMUSCULAR | Status: DC | PRN
  Administered 2016-03-01: 10 mL

## 2016-03-01 MED ORDER — SODIUM CHLORIDE 0.9% FLUSH: 3.0000 mL | INTRAVENOUS | Status: DC | PRN

## 2016-03-01 MED ORDER — FENTANYL CITRATE (PF) 100 MCG/2ML IJ SOLN
INTRAMUSCULAR | Status: DC | PRN
  Administered 2016-03-01: 50 ug via INTRAVENOUS

## 2016-03-01 MED ORDER — MIDAZOLAM HCL 2 MG/2ML IJ SOLN
INTRAMUSCULAR | Status: DC | PRN
  Administered 2016-03-01: 1 mg via INTRAVENOUS

## 2016-03-01 MED ORDER — ACETAMINOPHEN 325 MG PO TABS
650.0000 mg | ORAL_TABLET | ORAL | Status: DC | PRN
  Administered 2016-03-01: 650 mg via ORAL

## 2016-03-01 MED ORDER — SODIUM CHLORIDE 0.9 % IV SOLN
250.0000 mL | INTRAVENOUS | Status: DC | PRN
Start: 2016-03-01 — End: 2016-03-01

## 2016-03-01 MED ORDER — LABETALOL HCL 5 MG/ML IV SOLN
INTRAVENOUS | Status: AC
  Filled 2016-03-01: qty 4

## 2016-03-01 MED ORDER — ASPIRIN 81 MG PO CHEW: 81.0000 mg | CHEWABLE_TABLET | ORAL | Status: DC

## 2016-03-01 MED ORDER — HYDRALAZINE HCL 20 MG/ML IJ SOLN
10.0000 mg | Freq: Once | INTRAMUSCULAR | Status: AC
  Administered 2016-03-01: 10 mg via INTRAVENOUS

## 2016-03-01 MED ORDER — FENTANYL CITRATE (PF) 100 MCG/2ML IJ SOLN
INTRAMUSCULAR | Status: AC
  Filled 2016-03-01: qty 2

## 2016-03-01 MED ORDER — HEPARIN SODIUM (PORCINE) 1000 UNIT/ML IJ SOLN
INTRAMUSCULAR | Status: DC | PRN
  Administered 2016-03-01: 1000 [IU] via INTRAVENOUS

## 2016-03-01 MED ORDER — SODIUM CHLORIDE 0.9 % IV SOLN
INTRAVENOUS | Status: DC
  Administered 2016-03-01: 10:00:00 via INTRAVENOUS

## 2016-03-01 MED ORDER — OXYCODONE-ACETAMINOPHEN 5-325 MG PO TABS: 1.0000 | ORAL_TABLET | ORAL | Status: DC | PRN

## 2016-03-01 MED ORDER — MIDAZOLAM HCL 2 MG/2ML IJ SOLN
INTRAMUSCULAR | Status: AC
  Filled 2016-03-01: qty 2

## 2016-03-01 MED ORDER — LIDOCAINE HCL (PF) 1 % IJ SOLN
INTRAMUSCULAR | Status: AC
  Filled 2016-03-01: qty 30

## 2016-03-01 MED ORDER — SODIUM CHLORIDE 0.9 % IV SOLN: 250.0000 mL | INTRAVENOUS | Status: DC | PRN

## 2016-03-01 MED ORDER — HEPARIN (PORCINE) IN NACL 2-0.9 UNIT/ML-% IJ SOLN
INTRAMUSCULAR | Status: AC
  Filled 2016-03-01: qty 1000

## 2016-03-01 MED ORDER — LABETALOL HCL 5 MG/ML IV SOLN
INTRAVENOUS | Status: DC | PRN
  Administered 2016-03-01 (×2): 10 mg via INTRAVENOUS

## 2016-03-01 MED ORDER — ONDANSETRON HCL 4 MG/2ML IJ SOLN: 4.0000 mg | Freq: Four times a day (QID) | INTRAMUSCULAR | Status: DC | PRN

## 2016-03-01 MED ORDER — IOPAMIDOL (ISOVUE-370) INJECTION 76%
INTRAVENOUS | Status: AC
  Filled 2016-03-01: qty 100

## 2016-03-01 MED ORDER — SODIUM CHLORIDE 0.9 % IV SOLN: INTRAVENOUS | Status: DC

## 2016-03-01 MED ORDER — CLOPIDOGREL BISULFATE 75 MG PO TABS
75.0000 mg | ORAL_TABLET | Freq: Once | ORAL | Status: AC
  Administered 2016-03-01: 75 mg via ORAL

## 2016-03-01 SURGICAL SUPPLY — 11 items
CATH EXPO 5FR FL4 (CATHETERS) ×2 IMPLANT
CATH INFINITI 5 FR IM (CATHETERS) ×2 IMPLANT
CATH INFINITI 5FR MPB2 (CATHETERS) ×2 IMPLANT
CATH SWAN GANZ 7F STRAIGHT (CATHETERS) ×2 IMPLANT
KIT HEART LEFT (KITS) ×3 IMPLANT
PACK CARDIAC CATHETERIZATION (CUSTOM PROCEDURE TRAY) ×3 IMPLANT
SHEATH PINNACLE 5F 10CM (SHEATH) ×2 IMPLANT
SHEATH PINNACLE 7F 10CM (SHEATH) ×2 IMPLANT
TRANSDUCER W/STOPCOCK (MISCELLANEOUS) ×5 IMPLANT
TUBING CIL FLEX 10 FLL-RA (TUBING) ×3 IMPLANT
WIRE EMERALD 3MM-J .035X150CM (WIRE) ×2 IMPLANT

## 2016-03-01 NOTE — H&P (View-Only) (Signed)
Cardiology Office Note    Date:  02/28/2016   ID:  [name], DOB Mar 10, 1950, MRN [id number]  PCP:  No primary care provider on file.  Cardiologist: Tommy Grooms, MD   Chief Complaint  Patient presents with  . Coronary Artery Disease  . Congestive Heart Failure    History of Present Illness:  Tommy [name] is a 66 y.o. male here for follow-up of chronic coronary artery disease, newly discovered LV systolic dysfunction with EF less than 30%, hypertension, hyperlipidemia, and prior coronary artery bypass grafting. History of chronic left bundle branch block.  Echocardiography performed earlier this year as a routine screen identified significant reduction in LVEF from 45-50% in 2012 by cath to less than 30% by echo. He is a distance runner. He is training for a half marathon. He has not noted increased shortness of breath or other complaints. He did complain of some mild superficial chest discomfort that is present intermittently but not specifically exertion related. This feels nothing like prior anginal discomfort.  Metoprolol was discontinued and carvedilol 6.25 mg twice a day has been started.Tommy [name] is scheduled to be started But he has concerns about adding additional medication. He is in today for further discussion.   We discussed the implications of reduced LV function. We discussed the possibility that resynchronization therapy could help. We discussed that the decline in LV function could be related to silent occlusion of native and/or bypass graft vessels. We discussed our goal to improve LV function by guideline mandated heart failure therapy.  Past Medical History:  Diagnosis Date  . Blood transfusion without reported diagnosis   . CAD (coronary artery disease)    with CABG LIMA to LAD, free radial PDA, SVG to diagonal, SVG to OM. 2002. DES May 2011 and 01-2010 to SVG of Diag, native OM, and Native RCA. Unable to stent LAD via LIMA  . GERD (gastroesophageal  reflux disease)   . Hyperlipidemia    unable to tolarate statin therapy  . Kidney stone   . Vertigo     Past Surgical History:  Procedure Laterality Date  . CHOLECYSTECTOMY    . COLONOSCOPY    . CORONARY ARTERY BYPASS GRAFT      Current Medications: Outpatient Medications Prior to Visit  Medication Sig Dispense Refill  . aspirin 81 MG tablet Take 81 mg by mouth daily.    Marland Kitchen atorvastatin (LIPITOR) 10 MG tablet Take 5 mg by mouth daily. Take 1/2 pill daily    . carvedilol (COREG) 6.25 MG tablet Take 1 tablet (6.25 mg total) by mouth 2 (two) times daily. 180 tablet 3  . Cholecalciferol (VITAMIN D PO) Take 5,000 Units by mouth daily.     . clopidogrel (PLAVIX) 75 MG tablet TAKE 1 TABLET BY MOUTH DAILY WITH MEAL 30 tablet 9  . Dextromethorphan-Guaifenesin (MUCINEX DM MAXIMUM STRENGTH) 60-1200 MG TB12 Take 1 tablet by mouth daily as needed (congestion).    . FIBER PO Take 500 mg by mouth daily.    . niacinamide 500 MG tablet Take 500 mg by mouth daily.    Marland Kitchen NITROSTAT 0.4 MG SL tablet PLACE 1 TABLET UNDER THE TONGUE AS NEEDED 25 tablet 2  . omeprazole (PRILOSEC) 40 MG capsule Take 1 capsule (40 mg total) by mouth daily. 90 capsule 3  . ranitidine (ZANTAC) 75 MG tablet Take 75 mg by mouth daily.     . vitamin C (ASCORBIC ACID) 500 MG tablet Take 500 mg by mouth daily.    Marland Kitchen  Zinc 50 MG TABS Take 1 tablet by mouth daily.     No facility-administered medications prior to visit.      Allergies:   Fish oil   Social History   Social History  . Marital status: Married    Spouse name: N/A  . Number of children: N/A  . Years of education: N/A   Social History Main Topics  . Smoking status: Never Smoker  . Smokeless tobacco: Never Used  . Alcohol use 0.6 oz/week    1 Glasses of wine per week     Comment: prn  . Drug use: No  . Sexual activity: Not Asked   Other Topics Concern  . None   Social History Narrative  . None     Family History:  The patient's family history includes  Colon cancer in his father.   ROS:   Please see the history of present illness.    None other than noted  All other systems reviewed and are negative.   PHYSICAL EXAM:   VS:  BP (!) 150/88 (BP Location: Left Arm)   Pulse 81   Ht 6' (1.829 m)   Wt 189 lb 12.8 oz (86.1 kg)   BMI 25.74 kg/m    GEN: Well nourished, well developed, in no acute distress  HEENT: normal  Neck: no JVD, carotid bruits, or masses Cardiac: RRR; no murmurs, rubs, or gallops,no edema  Respiratory:  clear to auscultation bilaterally, normal work of breathing GI: soft, nontender, nondistended, + BS MS: no deformity or atrophy  Skin: warm and dry, no rash Neuro:  Alert and Oriented x 3, Strength and sensation are intact Psych: euthymic mood, full affect  Wt Readings from Last 3 Encounters:  02/28/16 189 lb 12.8 oz (86.1 kg)  02/09/16 191 lb 9.6 oz (86.9 kg)  08/25/15 180 lb (81.6 kg)      Studies/Labs Reviewed:   EKG:  EKG  Not repeated. Review the echocardiogram from January which shows a left bundle with QRS duration 150 ms.  Recent Labs: 03/03/2015: BUN 21; Creat 1.03; Potassium 4.1; Sodium 138 02/09/2016: ALT 50   Lipid Panel    Component Value Date/Time   CHOL 164 02/09/2016 1135   TRIG 162 (H) 02/09/2016 1135   HDL 45 02/09/2016 1135   CHOLHDL 3.6 02/09/2016 1135   CHOLHDL 3.3 03/03/2015 0744   VLDL 22 03/03/2015 0744   LDLCALC 87 02/09/2016 1135   LDLDIRECT 91.0 02/05/2014 0809    Additional studies/ records that were reviewed today include:  Personally reviewed the echocardiogram with the patient. LV enlargement and global left ventricular hypokinesis is noted. There is some septal dyskinesis. EF is in the 20-25% range.   ASSESSMENT:    1. Atherosclerosis of native coronary artery of native heart without angina pectoris   2. Chronic systolic heart failure ([place])   3. HYPERTENSION, BENIGN SYSTEMIC   4. Other hyperlipidemia   5. Ectopic atrial tachycardia ([facility])      PLAN:  In  order of problems listed above: 1. Coronary angiography is recommended to exclude the possibility of graft occlusion and native vessel progression. With decreased LV function he will need both left and right heart cath with coronary angiography. Will be done from the radial/arm approach. 2. New systolic dysfunction. Rule out hibernating myocardium due to silent occlusion of bypass or native vessels. He raises a question about whether there could be stunned myocardium in addition to the hibernating regions because the echo was done shortly after he had  participated in 6 mile run. This was an interesting concept. Of note, he has been exercising without significant dyspnea or limitations. He was training for a half marathon later this month. I have cautioned against running and heavy exercise. It is okay for him to walk for exercise.Entresto 50 mg twice a day will be started initially and uptitrated.       We discussed the eventual need for AICD and/or resynchronization if LV does not improve with medical therapy and potential revascularization. 3. Blood pressures elevated today so adding the additional medication will help to control.  He will need to follow-up with me in approximately 2 weeks after the left and right heart cath.   Medication Adjustments/Labs and Tests Ordered: Current medicines are reviewed at length with the patient today.  Concerns regarding medicines are outlined above.  Medication changes, Labs and Tests ordered today are listed in the Patient Instructions below. There are no Patient Instructions on file for this visit.   Signed, Tommy Grooms, MD  02/28/2016 3:55 PM    [address], [place], [place]  [address] Phone: [phone]; Fax: [phone]

## 2016-03-01 NOTE — Progress Notes (Signed)
Site area: RFV and RFA Site Prior to Removal:  Level 0 Pressure Applied For:15 Manual:   yes Patient Status During Pull:  Stable, tolerated well Post Pull Site:  Level0 Post Pull Instructions Given:  yes Post Pull Pulses Present: rt dp Dressing Applied:  Sterile dressing applied Bedrest begins @ 1520 Comments:patient tolerated well.

## 2016-03-01 NOTE — Op Note (Signed)
   CAD with prior CABG.  Occluded left radial artery graft to the RCA.   85% proximal SVG to the diagonal followed by 60-70% distal in-stent restenosis beyond the previously stented segment. Diagonal is large.  SVG to obtuse marginal is totally occluded.  Patent LIMA to mid LAD. Mid LAD beyond graft insertion 85%.  Native RCA is dominant. Full metal jacket. 50-60% mid restenosis and 85% distal margin restenosis. Total occlusion in distal RCA territory. Collaterals from the Left coronary.  Total occlusion of proximal LAD.

## 2016-03-01 NOTE — Interval H&P Note (Signed)
Cath Lab Visit (complete for each Cath Lab visit)  Clinical Evaluation Leading to the Procedure:   ACS: No.  Non-ACS:    Anginal Classification: CCS [name]  Anti-ischemic medical therapy: Maximal Therapy (2 or more classes of medications)  Non-Invasive Test Results: No non-invasive testing performed  Prior CABG: Prior CABG      History and Physical Interval Note:  03/01/2016 1:24 PM  Tommy [name]  has presented today for surgery, with the diagnosis of cm  The various methods of treatment have been discussed with the patient and family. After consideration of risks, benefits and other options for treatment, the patient has consented to  Procedure(s): Right/Left Heart Cath and Coronary/Graft Angiography (N/A) as a surgical intervention .  The patient's history has been reviewed, patient examined, no change in status, stable for surgery.  I have reviewed the patient's chart and labs.  Questions were answered to the patient's satisfaction.     Tommy [name]

## 2016-03-01 NOTE — Discharge Instructions (Signed)
Femoral Site Care °Introduction °Refer to this sheet in the next few weeks. These instructions provide you with information about caring for yourself after your procedure. Your health care provider may also give you more specific instructions. Your treatment has been planned according to current medical practices, but problems sometimes occur. Call your health care provider if you have any problems or questions after your procedure. °What can I expect after the procedure? °After your procedure, it is typical to have the following: °· Bruising at the site that usually fades within 1-2 weeks. °· Blood collecting in the tissue (hematoma) that may be painful to the touch. It should usually decrease in size and tenderness within 1-2 weeks. °Follow these instructions at home: °· Take medicines only as directed by your health care provider. °· You may shower 24-48 hours after the procedure or as directed by your health care provider. Remove the bandage (dressing) and gently wash the site with plain soap and water. Pat the area dry with a clean towel. Do not rub the site, because this may cause bleeding. °· Do not take baths, swim, or use a hot tub until your health care provider approves. °· Check your insertion site every day for redness, swelling, or drainage. °· Do not apply powder or lotion to the site. °· Limit use of stairs to twice a day for the first 2-3 days or as directed by your health care provider. °· Do not squat for the first 2-3 days or as directed by your health care provider. °· Do not lift over 10 lb (4.5 kg) for 5 days after your procedure or as directed by your health care provider. °· Ask your health care provider when it is okay to: °¨ Return to work or school. °¨ Resume usual physical activities or sports. °¨ Resume sexual activity. °· Do not drive home if you are discharged the same day as the procedure. Have someone else drive you. °· You may drive 24 hours after the procedure unless otherwise  instructed by your health care provider. °· Do not operate machinery or power tools for 24 hours after the procedure or as directed by your health care provider. °· If your procedure was done as an outpatient procedure, which means that you went home the same day as your procedure, a responsible adult should be with you for the first 24 hours after you arrive home. °· Keep all follow-up visits as directed by your health care provider. This is important. °Contact a health care provider if: °· You have a fever. °· You have chills. °· You have increased bleeding from the site. Hold pressure on the site. °Get help right away if: °· You have unusual pain at the site. °· You have redness, warmth, or swelling at the site. °· You have drainage (other than a small amount of blood on the dressing) from the site. °· The site is bleeding, and the bleeding does not stop after 30 minutes of holding steady pressure on the site. °· Your leg or foot becomes pale, cool, tingly, or numb. °This information is not intended to replace advice given to you by your health care provider. Make sure you discuss any questions you have with your health care provider. °Document Released: 08/29/2013 Document Revised: 06/03/2015 Document Reviewed: 07/15/2013 °© 2017 [employer]vier ° °

## 2016-03-02 ENCOUNTER — Encounter (HOSPITAL_COMMUNITY): Payer: Self-pay | Admitting: Interventional Cardiology

## 2016-03-02 MED FILL — Heparin Sodium (Porcine) 2 Unit/ML in Sodium Chloride 0.9%: INTRAMUSCULAR | Qty: 1000 | Status: AC

## 2016-03-06 ENCOUNTER — Telehealth: Payer: Self-pay | Admitting: Interventional Cardiology

## 2016-03-06 DIAGNOSIS — H2511 Age-related nuclear cataract, right eye: Secondary | ICD-10-CM | POA: Diagnosis not present

## 2016-03-06 DIAGNOSIS — H2513 Age-related nuclear cataract, bilateral: Secondary | ICD-10-CM | POA: Diagnosis not present

## 2016-03-06 DIAGNOSIS — H25043 Posterior subcapsular polar age-related cataract, bilateral: Secondary | ICD-10-CM | POA: Diagnosis not present

## 2016-03-06 DIAGNOSIS — I10 Essential (primary) hypertension: Secondary | ICD-10-CM | POA: Diagnosis not present

## 2016-03-06 DIAGNOSIS — H25013 Cortical age-related cataract, bilateral: Secondary | ICD-10-CM | POA: Diagnosis not present

## 2016-03-06 NOTE — Telephone Encounter (Signed)
Pt had a cardiac cath on 03/01/16. Pt said that on D/C the nurse recommended that if he feels a bump on his groin to call the office. Since Friday or Saturday pt has been feeling a bump on the   right groin on the right side of the cath site. Pt states today is a little bigger since he noticed it, and is a size of the tip of a  Finger. The site is bruised, tender to touch and just a little swelling. Pt is aware that  I asked another nurse at the office about her opinion. Lauren States it is a normal prices of healing and that was fine . Pt is aware, and  that it was good for pt to call to make sure everything was okay. Pt verbalized understanding. Pt has an appointment with Dr. Tamala Julian on 03/15/16 at 9:45 AM. Pt verbalized understanding.

## 2016-03-06 NOTE — Telephone Encounter (Signed)
New message   Pt c/o swelling: STAT is pt has developed SOB within 24 hours  1. How long have you been experiencing swelling? Since Friday or Saturday 2/24 - bump or swelling at cath location - pt states this is a bump more so than swelling  2. Where is the swelling located? On top of catheter incision   3.  Are you currently taking a "fluid pill"? NO  4.  Are you currently SOB? NO  5.  Have you traveled recently? NO

## 2016-03-07 ENCOUNTER — Encounter: Payer: Self-pay | Admitting: Cardiothoracic Surgery

## 2016-03-07 ENCOUNTER — Institutional Professional Consult (permissible substitution) (INDEPENDENT_AMBULATORY_CARE_PROVIDER_SITE_OTHER): Payer: Medicare Other | Admitting: Cardiothoracic Surgery

## 2016-03-07 ENCOUNTER — Telehealth: Payer: Self-pay | Admitting: Interventional Cardiology

## 2016-03-07 VITALS — BP 137/97 | HR 77 | Resp 16 | Ht 73.0 in | Wt 183.0 lb

## 2016-03-07 DIAGNOSIS — I2510 Atherosclerotic heart disease of native coronary artery without angina pectoris: Secondary | ICD-10-CM

## 2016-03-07 DIAGNOSIS — I25810 Atherosclerosis of coronary artery bypass graft(s) without angina pectoris: Secondary | ICD-10-CM | POA: Diagnosis not present

## 2016-03-07 NOTE — Telephone Encounter (Signed)
Request for surgical clearance:  1. What type of surgery is being performed? Cataract Extraction with Intraocular lens implantation of the right eye, followed by the left eye with topical anesthesia  2. When is this surgery scheduled? 05/02/2016   3. Are there any medications that need to be held prior to surgery and how long?  Plavix   4. Name of physician performing surgery? Dr. Lucita Ferrara   5. What is your office phone and fax number? Fax [phone]

## 2016-03-07 NOTE — Telephone Encounter (Signed)
Okay to hold plavix for cataract.  Needs f/u ov with me to discuss heart is 1-2 weeks.

## 2016-03-07 NOTE — Progress Notes (Signed)
PCP is No PCP Per Patient Referring Provider is Belva Crome, MD  Chief Complaint  Patient presents with  . Coronary Artery Disease    eval for REDO CABG....previous surgery was in 2002...ECHO 02/22/16, CATH 03/01/16  Patient examined, most recent cardiac catheterization and echocardiogram personally reviewed and counseled with patient  HPI: 66 year old male returns for discussion of possible redo CABG after recent cardiac catheterization demonstrates significant progression of both native and graft coronary disease. Patient had CABG 416 years ago including left IMA to LAD, radial artery to distal posterior descending, saphenous vein graft to distal circumflex and saphenous vein graft to diagonal. The left IMA is patent to the LAD. The saphenous vein graft to the large diagonal is patent. The SVG graft to the OM is occluded and the left radial free graft to the posterior descending is occluded. There are multiple stents in the right coronary which are patent with some disease. Ejection fraction overall is 30-35 percent. Right heart cath shows normal filling pressures, cardiac index 2.3. LVEDP is 10.  The patient states he is feeling well and ran 12 miles within the past week.  Because the patient's progression of disease a evaluation for possible redo CABG was requested by his cardiologist.  At the time of surgery his vessels were noted to have severe diffuse disease. At the time of surgery vein was harvested from his left leg and left arm radial artery was harvested. In the patient's early 20s he underwent complete vein stripping of the right leg greater saphenous vein The patient has minimal remaining saphenous vein in the left thigh. The patient has a palpable right radial pulse but a poor left ulnar pulse and I would not use his radial artery as a conduit due to high risk of ischemia to his dominant hand.  Because of the lack of conduit [the right IMA would not be long enough to reach his  distal right or distal circumflex vessels] the patient should not have redo CABG but his progression of disease should be managed medically with selective PCI as indicated.  Past Medical History:  Diagnosis Date  . Blood transfusion without reported diagnosis   . CAD (coronary artery disease)    with CABG LIMA to LAD, free radial PDA, SVG to diagonal, SVG to OM. 2002. DES May 2011 and 01-2010 to SVG of Diag, native OM, and Native RCA. Unable to stent LAD via LIMA  . GERD (gastroesophageal reflux disease)   . Hyperlipidemia    unable to tolarate statin therapy  . Kidney stone   . Vertigo     Past Surgical History:  Procedure Laterality Date  . CHOLECYSTECTOMY    . COLONOSCOPY    . CORONARY ARTERY BYPASS GRAFT    . RIGHT/LEFT HEART CATH AND CORONARY/GRAFT ANGIOGRAPHY N/A 03/01/2016   Procedure: Right/Left Heart Cath and Coronary/Graft Angiography;  Surgeon: Belva Crome, MD;  Location: [facility];  Service: Cardiovascular;  Laterality: N/A;    Family History  Problem Relation Age of Onset  . Colon cancer Father   . Sudden death Neg Hx   . Hypertension Neg Hx   . Hyperlipidemia Neg Hx   . Heart attack Neg Hx   . Diabetes Neg Hx   . Rectal cancer Neg Hx   . Stomach cancer Neg Hx     Social History Social History  Substance Use Topics  . Smoking status: Never Smoker  . Smokeless tobacco: Never Used  . Alcohol use 0.6 oz/week  1 Glasses of wine per week     Comment: prn    Current Outpatient Prescriptions  Medication Sig Dispense Refill  . aspirin 81 MG tablet Take 81 mg by mouth daily.    Marland Kitchen atorvastatin (LIPITOR) 10 MG tablet Take 5 mg by mouth daily.     . carvedilol (COREG) 6.25 MG tablet Take 1 tablet (6.25 mg total) by mouth 2 (two) times daily. 180 tablet 3  . Cholecalciferol (VITAMIN D3) 5000 units CAPS Take 5,000 Units by mouth daily.    . clopidogrel (PLAVIX) 75 MG tablet TAKE 1 TABLET BY MOUTH DAILY WITH MEAL 30 tablet 9  . Dextromethorphan-Guaifenesin  (MUCINEX DM MAXIMUM STRENGTH) 60-1200 MG TB12 Take 1 tablet by mouth daily as needed (congestion).    . FIBER PO Take 500 mg by mouth daily.    . finasteride (PROPECIA) 1 MG tablet Take 1 mg by mouth daily.  1  . niacinamide 500 MG tablet Take 500 mg by mouth daily.    Marland Kitchen NITROSTAT 0.4 MG SL tablet PLACE 1 TABLET UNDER THE TONGUE AS NEEDED (Patient taking differently: PLACE 1 TABLET UNDER THE TONGUE AS NEEDED FOR CHEST PAIN) 25 tablet 2  . omeprazole (PRILOSEC) 40 MG capsule Take 1 capsule (40 mg total) by mouth daily. 90 capsule 3  . oxymetazoline (AFRIN) 0.05 % nasal spray Place 1 spray into both nostrils at bedtime as needed for congestion.    . ranitidine (ZANTAC) 150 MG tablet Take 150 mg by mouth at bedtime.    . sacubitril-valsartan (ENTRESTO) 24-26 MG Take 1 tablet by mouth 2 (two) times daily. 180 tablet 3  . vitamin C (ASCORBIC ACID) 500 MG tablet Take 500 mg by mouth daily.    . Zinc 50 MG TABS Take 50 mg by mouth daily.      No current facility-administered medications for this visit.     Allergies  Allergen Reactions  . Fish Oil     Gives a bad feeling    Review of Systems         Review of Systems :  [ y ] = yes, [  ] = no        General :  Weight gain [   ]    Weight loss  [   ]  Fatigue [  ]  Fever [  ]  Chills  [  ]                                Weakness  [  ]           HEENT    Headache [  ]  Dizziness [  ]  Blurred vision [  ] Glaucoma  [  ]                          Nosebleeds [  ] Painful or loose teeth [  ]        Cardiac :  Chest pain/ pressure [  ]  Resting SOB [  ] exertional SOB [  ]                        Orthopnea [  ]  Pedal edema  [  ]  Palpitations [  ] Syncope/presyncope [ ]   Paroxysmal nocturnal dyspnea [  ]         Pulmonary : cough [  ]  wheezing [  ]  Hemoptysis [  ] Sputum [  ] Snoring [  ]                              Pneumothorax [  ]  Sleep apnea [  ]        GI : Vomiting [  ]  Dysphagia [  ]  Melena  [  ]  Abdominal  pain [  ] BRBPR [  ]              Heart burn [  ]  Constipation [  ] Diarrhea  [  ] Colonoscopy [   ]        GU : Hematuria [  ]  Dysuria [  ]  Nocturia [  ] UTI's [  ]        Vascular : Claudication [  ]  Rest pain [  ]  DVT [  ] Vein stripping [  ] leg ulcers [  ]                          TIA [  ] Stroke [  ]  Varicose veins [  ]        NEURO :  Headaches  [  ] Seizures [  ] Vision changes [  ] Paresthesias [  ]                                       Seizures [  ]        Musculoskeletal :  Arthritis [  ] Gout  [  ]  Back pain [ mild ]  Joint pain [mild  ]        Skin :  Rash [  ]  Melanoma [  ] Sores [  ]        Heme : Bleeding problems [  ]Clotting Disorders [  ] Anemia [  ]Blood Transfusion [ ]         Endocrine : Diabetes [  ] Heat or Cold intolerance [  ] Polyuria [  ]excessive thirst [ ]         Psych : Depression [  ]  Anxiety [  ]  Psych hospitalizations [  ] Memory change [  ]         Right-hand dominant   Status post surgical vein stripping of the entire right leg greater saphenous vein in his early 35s                                        BP (!) 137/97 (BP Location: Left Arm, Patient Position: Sitting, Cuff Size: Large)   Pulse 77   Resp 16   Ht 6\' 1"  (1.854 m)   Wt 183 lb (83 kg)   SpO2 97% Comment: ON RA  BMI 24.14 kg/m  Physical Exam     Physical Exam  General: Well-developed well-nourished middle-aged Caucasian male no acute distress HEENT: Normocephalic pupils equal , dentition adequate Neck: Supple without JVD, adenopathy, or bruit Chest: Clear to  auscultation, symmetrical breath sounds, no rhonchi, no tenderness             or deformity. Well-healed sternal incision Cardiovascular: Regular rate and rhythm, no murmur, no gallop, peripheral pulses             palpable in all extremities Abdomen:  Soft, nontender, no palpable mass or organomegaly Extremities: Warm, well-perfused, no clubbing cyanosis edema or tenderness,    Surgical scar from mid left  thigh to ankle from left saphenous vein harvest Surgical scars in right thigh and right ankle from surgical vein stripping Surgical scar left forearm from left radial artery harvest Rectal/GU: Deferred Neuro: Grossly non--focal and symmetrical throughout Skin: Clean and dry without rash or ulceration   Diagnostic Tests: Cardiac cath reviewed  Impression: Recurrent CAD with moderate - severe compensated LV dysfunction-filling pressures and cardiac index remained normal.  Plan: Would not recommend redo CABG because of lack of adequate conduit. Medical therapy versus PCI as indicated.   Len Childs, MD [facility] [phone]

## 2016-03-08 ENCOUNTER — Telehealth: Payer: Self-pay | Admitting: Interventional Cardiology

## 2016-03-08 NOTE — Telephone Encounter (Signed)
Spoke with pt and advised I will double check with Dr. Tamala Julian to see if ok to wait and do labs on 03/15/16.  Pt was started on Entresto and was to have f/u labs on 03/10/16.  Will it be ok to wait until the 7th when he is here to appt?

## 2016-03-08 NOTE — Telephone Encounter (Signed)
Fax sent to requesting office.  

## 2016-03-08 NOTE — Telephone Encounter (Signed)
New message    Pt is calling to ask if he should keep appt  03/10/16 and 03/15/16.

## 2016-03-09 NOTE — Telephone Encounter (Signed)
Advised pt ok to wait and get labs on 03/15/16 at time of appt.  Pt appreciative for call.

## 2016-03-09 NOTE — Telephone Encounter (Signed)
Yes

## 2016-03-10 ENCOUNTER — Other Ambulatory Visit: Payer: Medicare Other

## 2016-03-15 ENCOUNTER — Ambulatory Visit (INDEPENDENT_AMBULATORY_CARE_PROVIDER_SITE_OTHER): Payer: Medicare Other | Admitting: Interventional Cardiology

## 2016-03-15 ENCOUNTER — Encounter: Payer: Self-pay | Admitting: Interventional Cardiology

## 2016-03-15 ENCOUNTER — Other Ambulatory Visit: Payer: Medicare Other

## 2016-03-15 VITALS — BP 132/74 | HR 69 | Ht 73.0 in | Wt 185.4 lb

## 2016-03-15 DIAGNOSIS — I10 Essential (primary) hypertension: Secondary | ICD-10-CM

## 2016-03-15 DIAGNOSIS — I471 Supraventricular tachycardia: Secondary | ICD-10-CM | POA: Diagnosis not present

## 2016-03-15 DIAGNOSIS — I2510 Atherosclerotic heart disease of native coronary artery without angina pectoris: Secondary | ICD-10-CM | POA: Diagnosis not present

## 2016-03-15 DIAGNOSIS — I5022 Chronic systolic (congestive) heart failure: Secondary | ICD-10-CM

## 2016-03-15 DIAGNOSIS — E784 Other hyperlipidemia: Secondary | ICD-10-CM | POA: Diagnosis not present

## 2016-03-15 DIAGNOSIS — I25118 Atherosclerotic heart disease of native coronary artery with other forms of angina pectoris: Secondary | ICD-10-CM

## 2016-03-15 DIAGNOSIS — E7849 Other hyperlipidemia: Secondary | ICD-10-CM

## 2016-03-15 DIAGNOSIS — I209 Angina pectoris, unspecified: Secondary | ICD-10-CM

## 2016-03-15 LAB — BASIC METABOLIC PANEL
BUN/Creatinine Ratio: 16 (ref 10–24)
BUN: 14 mg/dL (ref 8–27)
CO2: 22 mmol/L (ref 18–29)
CREATININE: 0.9 mg/dL (ref 0.76–1.27)
Calcium: 9.7 mg/dL (ref 8.6–10.2)
Chloride: 100 mmol/L (ref 96–106)
GFR calc non Af Amer: 89 mL/min/{1.73_m2} (ref >59)
GFR, EST AFRICAN AMERICAN: 103 mL/min/{1.73_m2} (ref >59)
Glucose: 109 mg/dL — ABNORMAL HIGH (ref 65–99)
Potassium: 4.6 mmol/L (ref 3.5–5.2)
Sodium: 141 mmol/L (ref 134–144)

## 2016-03-15 MED ORDER — NITROGLYCERIN 0.4 MG SL SUBL: SUBLINGUAL_TABLET | SUBLINGUAL | 2 refills | Status: DC

## 2016-03-15 NOTE — Patient Instructions (Signed)
Medication Instructions:  None  Labwork: BMET today  Testing/Procedures: None  Follow-Up: Your physician recommends that you schedule a follow-up appointment next available with Dr. Lovena Le.  Your physician recommends that you schedule a follow-up appointment in: 6-8 weeks with Dr. Tamala Julian.    Any Other Special Instructions Will Be Listed Below (If Applicable).     If you need a refill on your cardiac medications before your next appointment, please call your pharmacy.

## 2016-03-15 NOTE — Progress Notes (Addendum)
Cardiology Office Note    Date:  03/15/2016   ID:  [name], DOB 23-Sep-1950, MRN [id number]  PCP:  No PCP Per Patient  Cardiologist: Sinclair Grooms, MD   Chief Complaint  Patient presents with  . Coronary Artery Disease    History of Present Illness:  Tommy [name] is a 66 y.o. male chronic coronary artery disease, newly discovered LV systolic dysfunction with EF less than 30%, hypertension, hyperlipidemia, and prior coronary artery bypass grafting Intermittent ectopic or reentrant atrial tachycardia which has never been fully characterized and left bundle branch block.  Nearly a month ago at his yearly follow-up I decided to perform an echocardiogram to reassess LV function, mostly because of left bundle branch block. The left ventricular function was noted to be significantly impaired with an estimated EF of 20-25%. This then led to further evaluation including coronary angiography. He has a prior history of CAD with multiple prior stents and ultimately coronary bypass grafting in 2002. Angiography demonstrated bypass graft failure and multifocal native obstructive disease including multiple sites of in-stent restenosis throughout the coronary tree. He was referred to Dr. Tharon Aquas Trigt for consideration of surgical revascularization but was turned down for repeat surgery because of absent/inadequate conduit. In addition to previous CABG which utilized the left internal mammary, left radial, and saphenous vein, he has also had prior vein stripping in the right extremity.  He is not symptomatic with typical activity. He has been a lifelong runner. More recently his exertional tolerance is decreased. He denies exertional chest discomfort but will occasionally have spontaneous chest pressure one to 2 hours postprandial. He has never tried nitroglycerin for the discomfort.  I also note that prior to the echo noted above and below, he had run 5 miles, raising the question of  stunned/hibernating myocardium falsely lowering the LVEF. LV function at coronary angiography however demonstrated dilated LV with EF in the 25-30% range. Therefore I do believe that there was not significant acute reduction in LV function and that an EF less than 30% is real  Past Medical History:  Diagnosis Date  . Blood transfusion without reported diagnosis   . CAD (coronary artery disease)    with CABG LIMA to LAD, free radial PDA, SVG to diagonal, SVG to OM. 2002. DES May 2011 and 01-2010 to SVG of Diag, native OM, and Native RCA. Unable to stent LAD via LIMA  . GERD (gastroesophageal reflux disease)   . Hyperlipidemia    unable to tolarate statin therapy  . Kidney stone   . Vertigo     Past Surgical History:  Procedure Laterality Date  . CHOLECYSTECTOMY    . COLONOSCOPY    . CORONARY ARTERY BYPASS GRAFT    . RIGHT/LEFT HEART CATH AND CORONARY/GRAFT ANGIOGRAPHY N/A 03/01/2016   Procedure: Right/Left Heart Cath and Coronary/Graft Angiography;  Surgeon: Belva Crome, MD;  Location: [facility];  Service: Cardiovascular;  Laterality: N/A;    Current Medications: Outpatient Medications Prior to Visit  Medication Sig Dispense Refill  . aspirin 81 MG tablet Take 81 mg by mouth daily.    Marland Kitchen atorvastatin (LIPITOR) 10 MG tablet Take 5 mg by mouth daily.     . carvedilol (COREG) 6.25 MG tablet Take 1 tablet (6.25 mg total) by mouth 2 (two) times daily. 180 tablet 3  . Cholecalciferol (VITAMIN D3) 5000 units CAPS Take 5,000 Units by mouth daily.    . clopidogrel (PLAVIX) 75 MG tablet TAKE 1 TABLET  BY MOUTH DAILY WITH MEAL 30 tablet 9  . Dextromethorphan-Guaifenesin (MUCINEX DM MAXIMUM STRENGTH) 60-1200 MG TB12 Take 1 tablet by mouth daily as needed (congestion).    . FIBER PO Take 500 mg by mouth daily.    . finasteride (PROPECIA) 1 MG tablet Take 1 mg by mouth daily.  1  . niacinamide 500 MG tablet Take 500 mg by mouth daily.    Marland Kitchen NITROSTAT 0.4 MG SL tablet PLACE 1 TABLET UNDER  THE TONGUE AS NEEDED (Patient taking differently: PLACE 1 TABLET UNDER THE TONGUE AS NEEDED FOR CHEST PAIN) 25 tablet 2  . omeprazole (PRILOSEC) 40 MG capsule Take 1 capsule (40 mg total) by mouth daily. 90 capsule 3  . oxymetazoline (AFRIN) 0.05 % nasal spray Place 1 spray into both nostrils at bedtime as needed for congestion.    . ranitidine (ZANTAC) 150 MG tablet Take 150 mg by mouth at bedtime.    . sacubitril-valsartan (ENTRESTO) 24-26 MG Take 1 tablet by mouth 2 (two) times daily. 180 tablet 3  . vitamin C (ASCORBIC ACID) 500 MG tablet Take 500 mg by mouth daily.    . Zinc 50 MG TABS Take 50 mg by mouth daily.      No facility-administered medications prior to visit.      Allergies:   Fish oil   Social History   Social History  . Marital status: Married    Spouse name: N/A  . Number of children: N/A  . Years of education: N/A   Social History Main Topics  . Smoking status: Never Smoker  . Smokeless tobacco: Never Used  . Alcohol use 0.6 oz/week    1 Glasses of wine per week     Comment: prn  . Drug use: No  . Sexual activity: Not Asked   Other Topics Concern  . None   Social History Narrative  . None     Family History:  The patient's family history includes Colon cancer in his father.   ROS:   Please see the history of present illness.    There is a component of depression. He is saddened by the appearance of limited treatment options. He denies orthopnea, PND, edema, and dyspnea on exertion, and syncope. He occasionally has recurrent palpitations that can last minutes. He has had this for quite some time and appears to be an atrial tachycardia or relatively slow reentrant tachycardia in the 1:30 to 140 be per minute range. Decreased vision and needs to have cataract surgery.  All other systems reviewed and are negative.   PHYSICAL EXAM:   VS:  BP 132/74 (BP Location: Left Arm)   Pulse 69   Ht 6\' 1"  (1.854 m)   Wt 185 lb 6.4 oz (84.1 kg)   BMI 24.46 kg/m      GEN: Well nourished, well developed, in no acute distress  HEENT: normal  Neck: no JVD, carotid bruits, or masses Cardiac: RRR; no murmurs, rubs, or gallops,no edema  Respiratory:  clear to auscultation bilaterally, normal work of breathing GI: soft, nontender, nondistended, + BS MS: no deformity or atrophy  Skin: warm and dry, no rash Neuro:  Alert and Oriented x 3, Strength and sensation are intact Psych: euthymic mood, full affect  Wt Readings from Last 3 Encounters:  03/15/16 185 lb 6.4 oz (84.1 kg)  03/07/16 183 lb (83 kg)  03/01/16 182 lb (82.6 kg)      Studies/Labs Reviewed:   EKG:  EKG Sinus rhythm, 69 bpm, left  bundle branch block, QRS duration 154 ms. left bundle branch block, and QRS duration 156 ms. No significant change compared to prior.  Recent Labs: 02/09/2016: ALT 50 03/01/2016: BUN 14; Creatinine, Ser 0.96; Hemoglobin 16.1; Platelets 324; Potassium 3.7; Sodium 141   Lipid Panel    Component Value Date/Time   CHOL 164 02/09/2016 1135   TRIG 162 (H) 02/09/2016 1135   HDL 45 02/09/2016 1135   CHOLHDL 3.6 02/09/2016 1135   CHOLHDL 3.3 03/03/2015 0744   VLDL 22 03/03/2015 0744   LDLCALC 87 02/09/2016 1135   LDLDIRECT 91.0 02/05/2014 0809    Additional studies/ records that were reviewed today include:   Cardiac catheterization 03/2015:  Coronary Diagrams   Diagnostic Diagram        performed on 03/07/16: Impression: Recurrent CAD with moderate - severe compensated LV dysfunction-filling pressures and cardiac index remained normal.  Plan: Would not recommend redo CABG because of lack of adequate conduit. Medical therapy versus PCI as indicated.   ASSESSMENT:    1. Atherosclerosis of native coronary artery of native heart with other form of angina pectoris ([place])   2. Chronic systolic heart failure ([place])   3. HYPERTENSION, BENIGN SYSTEMIC   4. Ectopic atrial tachycardia ([facility])   5. Other hyperlipidemia      PLAN:  In order of problems listed  above:  1. Aerobic activity with precautions not to exercise alone. Prolonged running is not recommended.. Consider targeted PCI if accelerated angina develops. Not currently in favor of multisite stenting especially within restenosis sites prior first generation DES. Deemed not a re-do surgical candidate by Dr.PVT, because of inadequate conduit. Will consider outside referral to see if high-risk surgery s possible at a [facility]. I will look into [facility] or wait for his referral. He would prefer [facility]. 2. Refer to EP for consideration of AICD/resynchronization.  3. Excellent control on the current medical regimen 4. This problem would also benefit from an EP opinion. Not terribly symptomatic but present intermittently over the last several years. Suspicion is that this tachycardia is supraventricular but could be ventricular tachycardia.    Overall, plan clinical follow-up in 6-8 weeks to discuss the EP opinion and also reconsider revascularization options after tertiary referral.  Medication Adjustments/Labs and Tests Ordered: Current medicines are reviewed at length with the patient today.  Concerns regarding medicines are outlined above.  Medication changes, Labs and Tests ordered today are listed in the Patient Instructions below. There are no Patient Instructions on file for this visit.   Signed, Sinclair Grooms, MD  03/15/2016 10:09 AM    [address], [place], [place]  [address] Phone: [phone]; Fax: [phone]

## 2016-03-16 ENCOUNTER — Ambulatory Visit: Payer: Medicare Other | Admitting: Interventional Cardiology

## 2016-03-16 ENCOUNTER — Telehealth: Payer: Self-pay | Admitting: *Deleted

## 2016-03-16 DIAGNOSIS — I5022 Chronic systolic (congestive) heart failure: Secondary | ICD-10-CM

## 2016-03-16 MED ORDER — SACUBITRIL-VALSARTAN 49-51 MG PO TABS
1.0000 | ORAL_TABLET | Freq: Two times a day (BID) | ORAL | 3 refills | Status: DC
Start: 2016-03-16 — End: 2016-03-30

## 2016-03-16 NOTE — Telephone Encounter (Signed)
Spoke with pt and reviewed lab results and recommendations per Dr. Tamala Julian.  Pt verbalized understanding and as in agreement with this plan.  Advised I would place samples at the front desk.  Pt will have labs drawn at time of appt with Dr. Lovena Le. Pt appreciative for call.

## 2016-03-16 NOTE — Telephone Encounter (Signed)
-----   Message from Belva Crome, MD sent at 03/15/2016  7:05 PM EST ----- Let the patient know kidney function and potassium are normal. It will be okay to uptitrate Entresto to 49/51 mg BID Needs BMET 2 weeks after uptitration A copy will be sent to No PCP Per Patient

## 2016-03-29 ENCOUNTER — Telehealth: Payer: Self-pay | Admitting: Interventional Cardiology

## 2016-03-29 NOTE — Telephone Encounter (Signed)
Patient calling, states that he was given a medication and was advised to call back if he could take medication. Patient calling to get a prescription for medication. Thanks.

## 2016-03-30 MED ORDER — CLOPIDOGREL BISULFATE 75 MG PO TABS: ORAL_TABLET | ORAL | 3 refills | Status: DC

## 2016-03-30 MED ORDER — SACUBITRIL-VALSARTAN 49-51 MG PO TABS: 1.0000 | ORAL_TABLET | Freq: Two times a day (BID) | ORAL | 3 refills | Status: DC

## 2016-03-30 NOTE — Telephone Encounter (Signed)
Spoke with pt and he states that he has been doing great on the [facility] Novamed 49/51.  He needed prescription for Entresto and Plavix sent to [facility].  Advised pt I would send those prescriptions in.  Pt appreciative for assistance.

## 2016-04-04 ENCOUNTER — Other Ambulatory Visit: Payer: Medicare Other

## 2016-04-04 ENCOUNTER — Encounter: Payer: Self-pay | Admitting: Internal Medicine

## 2016-04-04 ENCOUNTER — Ambulatory Visit (INDEPENDENT_AMBULATORY_CARE_PROVIDER_SITE_OTHER): Payer: Medicare Other | Admitting: Internal Medicine

## 2016-04-04 VITALS — BP 132/78 | HR 58 | Ht 73.0 in | Wt 189.0 lb

## 2016-04-04 DIAGNOSIS — I5022 Chronic systolic (congestive) heart failure: Secondary | ICD-10-CM

## 2016-04-04 DIAGNOSIS — I447 Left bundle-branch block, unspecified: Secondary | ICD-10-CM

## 2016-04-04 NOTE — Patient Instructions (Addendum)
Medication Instructions:    Your physician recommends that you continue on your current medications as directed. Please refer to the Current Medication list given to you today.  --- If you need a refill on your cardiac medications before your next appointment, please call your pharmacy. ---  Labwork:  None ordered  Testing/Procedures: Your physician has requested that you have a cardiac MRI mid-late May. Cardiac MRI uses a computer to create images of your heart as its beating, producing both still and moving pictures of your heart and major blood vessels. For further information please visit [email or web address] Please follow the instruction sheet given to you today for more information.  Follow-Up:  Your physician recommends that you schedule a follow-up appointment in: early June with Dr. Lovena Le (after cardiac MRI)    Thank you for choosing [facility]!!

## 2016-04-04 NOTE — Progress Notes (Signed)
HPI Tommy [name] is referred today by Dr. Tamala Julian for evaluation of severe LV dysfunction and LBBB. He is a very healthy 66 yo man with CAD, s/p CABG, who has been a competetive runner for years. He noted that his exercise capacity decreased prior to his initial cardiac evaluation and he has subsequently undergone coronary stents and ultimately had CABG. He has had preserved LV function until recently when he had a 2D echo where his EF had reduced to 30%. He underwent repeat cath where he was noted to have severe native as well as graft disease. He was referred back to consider redo CABG but was not thought to have very good conduits. He was turned down. He has developed LBBB but has not had syncope. He can still run well though he has not been competing in marathons anymore.  Allergies  Allergen Reactions  . Fish Oil     Gives a bad feeling     Current Outpatient Prescriptions  Medication Sig Dispense Refill  . aspirin 81 MG tablet Take 81 mg by mouth daily.    Marland Kitchen atorvastatin (LIPITOR) 10 MG tablet Take 10 mg by mouth daily.     . carvedilol (COREG) 6.25 MG tablet Take 1 tablet (6.25 mg total) by mouth 2 (two) times daily. 180 tablet 3  . Cholecalciferol (VITAMIN D3) 5000 units CAPS Take 5,000 Units by mouth daily.    . clopidogrel (PLAVIX) 75 MG tablet TAKE 1 TABLET BY MOUTH DAILY WITH MEAL 90 tablet 3  . Dextromethorphan-Guaifenesin (MUCINEX DM MAXIMUM STRENGTH) 60-1200 MG TB12 Take 1 tablet by mouth daily as needed (congestion).    . FIBER PO Take 500 mg by mouth daily.    . finasteride (PROPECIA) 1 MG tablet Take 1 mg by mouth daily.  1  . niacinamide 500 MG tablet Take 500 mg by mouth daily.    . nitroGLYCERIN (NITROSTAT) 0.4 MG SL tablet PLACE 1 TABLET UNDER THE TONGUE AS NEEDED 25 tablet 2  . omeprazole (PRILOSEC) 40 MG capsule Take 1 capsule (40 mg total) by mouth daily. 90 capsule 3  . oxymetazoline (AFRIN) 0.05 % nasal spray Place 1 spray into both nostrils at bedtime as needed  for congestion.    . ranitidine (ZANTAC) 150 MG tablet Take 150 mg by mouth at bedtime.    . sacubitril-valsartan (ENTRESTO) 49-51 MG Take 1 tablet by mouth 2 (two) times daily. 180 tablet 3  . vitamin C (ASCORBIC ACID) 500 MG tablet Take 500 mg by mouth daily.    . Zinc 50 MG TABS Take 50 mg by mouth daily.      No current facility-administered medications for this visit.      Past Medical History:  Diagnosis Date  . Blood transfusion without reported diagnosis   . CAD (coronary artery disease)    with CABG LIMA to LAD, free radial PDA, SVG to diagonal, SVG to OM. 2002. DES May 2011 and 01-2010 to SVG of Diag, native OM, and Native RCA. Unable to stent LAD via LIMA  . GERD (gastroesophageal reflux disease)   . Hyperlipidemia    unable to tolarate statin therapy  . Kidney stone   . Vertigo     ROS:   All systems reviewed and negative except as noted in the HPI.   Past Surgical History:  Procedure Laterality Date  . CHOLECYSTECTOMY    . COLONOSCOPY    . CORONARY ARTERY BYPASS GRAFT    . RIGHT/LEFT HEART CATH AND  CORONARY/GRAFT ANGIOGRAPHY N/A 03/01/2016   Procedure: Right/Left Heart Cath and Coronary/Graft Angiography;  Surgeon: Belva Crome, MD;  Location: [facility];  Service: Cardiovascular;  Laterality: N/A;     Family History  Problem Relation Age of Onset  . Colon cancer Father   . Sudden death Neg Hx   . Hypertension Neg Hx   . Hyperlipidemia Neg Hx   . Heart attack Neg Hx   . Diabetes Neg Hx   . Rectal cancer Neg Hx   . Stomach cancer Neg Hx      Social History   Social History  . Marital status: Married    Spouse name: N/A  . Number of children: N/A  . Years of education: N/A   Occupational History  . Not on file.   Social History Main Topics  . Smoking status: Never Smoker  . Smokeless tobacco: Never Used  . Alcohol use 0.6 oz/week    1 Glasses of wine per week     Comment: prn  . Drug use: No  . Sexual activity: Not on file    Other Topics Concern  . Not on file   Social History Narrative  . No narrative on file     BP 132/78   Pulse (!) 58   Ht 6\' 1"  (1.854 m)   Wt 189 lb (85.7 kg)   BMI 24.94 kg/m   Physical Exam:  Well appearing NAD HEENT: Unremarkable Neck:  No JVD, no thyromegally Lymphatics:  No adenopathy Back:  No CVA tenderness Lungs:  Clear HEART:  Regular rate rhythm, no murmurs, no rubs, no clicks Abd:  soft, positive bowel sounds, no organomegally, no rebound, no guarding Ext:  2 plus pulses, no edema, no cyanosis, no clubbing Skin:  No rashes no nodules Neuro:  CN II through XII intact, motor grossly intact  EKG - nsr with LBBB  DEVICE  Normal device function.  See PaceArt for details.   Assess/Plan: 1. 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 I have discussed the treatment options with the patient. He is not inclined to proceed with ICD implant at this time. He is not a good candidate for a BiV device because he is still active and really has class 1 symptoms. I have recommended that the patient hold off on ICD insertion but to have him undergo a cardiac MRI in about 3 more months. Hopefully his pumping function will improve. 2. LBBB - he is not symptomatic. He will undergo observation. 3. HTN - his blood pressure is controlled. Will continue his current meds.  Tommy [name].d.

## 2016-04-05 ENCOUNTER — Telehealth: Payer: Self-pay | Admitting: Internal Medicine

## 2016-04-05 NOTE — Telephone Encounter (Signed)
Called patient and left my name and number to call me back stating when he wants his MRI scheduled.

## 2016-04-11 ENCOUNTER — Encounter: Payer: Self-pay | Admitting: Internal Medicine

## 2016-04-11 ENCOUNTER — Telehealth: Payer: Self-pay | Admitting: Internal Medicine

## 2016-04-11 NOTE — Telephone Encounter (Signed)
Called and left a voicemail regarding cardiac MRI scheduled 07-03-16 at 7:30 a.m., for labs before test.  Gave date, time and location of test.  Letter mailed to the patient today and message to the nurse.

## 2016-04-18 DIAGNOSIS — H2511 Age-related nuclear cataract, right eye: Secondary | ICD-10-CM | POA: Diagnosis not present

## 2016-05-02 DIAGNOSIS — H2512 Age-related nuclear cataract, left eye: Secondary | ICD-10-CM | POA: Diagnosis not present

## 2016-05-02 NOTE — Progress Notes (Signed)
Cardiology Office Note    Date:  05/03/2016   ID:  Tommy [name], DOB 1950/12/26, MRN [id number]  PCP:  No PCP Per Patient  Cardiologist: Sinclair Grooms, MD   Chief Complaint  Patient presents with  . Follow-up    CAD - no complaints other than entresto cost    History of Present Illness:  Tommy [name] is a 66 y.o. male chronic coronary artery disease, newly discovered LV systolic dysfunction with EF less than 30%, hypertension, hyperlipidemia, and prior coronary artery bypass grafting Intermittent ectopic or reentrant atrial tachycardia which has never been fully characterized and left bundle branch block.   No significant anginal complaints. Took Entresto 49/51 mg twice a day and tolerated it well. Until Medicare part D starts he will not be able to comfortably afford the medication. In the meantime we will use lisinopril 20 mg per day. Kidney function was normal on Entresto.   Had an EP evaluation to consider AICD. Felt to be too functional at the current time and not a candidate for re- synchronization therapy because he is too active. No episodes of syncope. No prolonged complications.  Past Medical History:  Diagnosis Date  . Blood transfusion without reported diagnosis   . CAD (coronary artery disease)    with CABG LIMA to LAD, free radial PDA, SVG to diagonal, SVG to OM. 2002. DES May 2011 and 01-2010 to SVG of Diag, native OM, and Native RCA. Unable to stent LAD via LIMA  . GERD (gastroesophageal reflux disease)   . Hyperlipidemia    unable to tolarate statin therapy  . Kidney stone   . Vertigo     Past Surgical History:  Procedure Laterality Date  . CHOLECYSTECTOMY    . COLONOSCOPY    . CORONARY ARTERY BYPASS GRAFT    . RIGHT/LEFT HEART CATH AND CORONARY/GRAFT ANGIOGRAPHY N/A 03/01/2016   Procedure: Right/Left Heart Cath and Coronary/Graft Angiography;  Surgeon: Belva Crome, MD;  Location: [facility];  Service: Cardiovascular;  Laterality: N/A;     Current Medications: Outpatient Medications Prior to Visit  Medication Sig Dispense Refill  . aspirin 81 MG tablet Take 81 mg by mouth daily.    Marland Kitchen atorvastatin (LIPITOR) 10 MG tablet Take 10 mg by mouth daily.     . carvedilol (COREG) 6.25 MG tablet Take 1 tablet (6.25 mg total) by mouth 2 (two) times daily. 180 tablet 3  . Cholecalciferol (VITAMIN D3) 5000 units CAPS Take 5,000 Units by mouth daily.    . clopidogrel (PLAVIX) 75 MG tablet TAKE 1 TABLET BY MOUTH DAILY WITH MEAL 90 tablet 3  . Dextromethorphan-Guaifenesin (MUCINEX DM MAXIMUM STRENGTH) 60-1200 MG TB12 Take 1 tablet by mouth daily as needed (congestion).    . FIBER PO Take 500 mg by mouth daily.    . finasteride (PROPECIA) 1 MG tablet Take 1 mg by mouth daily.  1  . niacinamide 500 MG tablet Take 500 mg by mouth daily.    . nitroGLYCERIN (NITROSTAT) 0.4 MG SL tablet PLACE 1 TABLET UNDER THE TONGUE AS NEEDED 25 tablet 2  . omeprazole (PRILOSEC) 40 MG capsule Take 1 capsule (40 mg total) by mouth daily. 90 capsule 3  . oxymetazoline (AFRIN) 0.05 % nasal spray Place 1 spray into both nostrils at bedtime as needed for congestion.    . ranitidine (ZANTAC) 150 MG tablet Take 150 mg by mouth at bedtime.    . sacubitril-valsartan (ENTRESTO) 49-51 MG Take 1 tablet by  mouth 2 (two) times daily. 180 tablet 3  . vitamin C (ASCORBIC ACID) 500 MG tablet Take 500 mg by mouth daily.    . Zinc 50 MG TABS Take 50 mg by mouth daily.      No facility-administered medications prior to visit.      Allergies:   Fish oil   Social History   Social History  . Marital status: Married    Spouse name: N/A  . Number of children: N/A  . Years of education: N/A   Social History Main Topics  . Smoking status: Never Smoker  . Smokeless tobacco: Never Used  . Alcohol use 0.6 oz/week    1 Glasses of wine per week     Comment: prn  . Drug use: No  . Sexual activity: Not Asked   Other Topics Concern  . None   Social History Narrative  .  None     Family History:  The patient's family history includes Colon cancer in his father.   ROS:   Please see the history of present illness.    Singh cataract surgery bilateral. Facial erythema possibly related to rosacea and now taking doxycycline.  All other systems reviewed and are negative.   PHYSICAL EXAM:   VS:  BP 138/90   Pulse 64   Ht '6\' 1"'  (1.854 m)   Wt 184 lb 1.6 oz (83.5 kg)   BMI 24.29 kg/m    GEN: Well nourished, well developed, in no acute distress  HEENT: normal  Neck: no JVD, carotid bruits, or masses Cardiac: RRR; no murmurs, rubs, or gallops,no edema  Respiratory:  clear to auscultation bilaterally, normal work of breathing GI: soft, nontender, nondistended, + BS MS: no deformity or atrophy  Skin: warm and dry, no rash Neuro:  Alert and Oriented x 3, Strength and sensation are intact Psych: euthymic mood, full affect  Wt Readings from Last 3 Encounters:  05/03/16 184 lb 1.6 oz (83.5 kg)  04/04/16 189 lb (85.7 kg)  03/15/16 185 lb 6.4 oz (84.1 kg)      Studies/Labs Reviewed:   EKG:  EKG  Not repeated  Recent Labs: 02/09/2016: ALT 50 03/01/2016: Hemoglobin 16.1; Platelets 324 03/15/2016: BUN 14; Creatinine, Ser 0.90; Potassium 4.6; Sodium 141   Lipid Panel    Component Value Date/Time   CHOL 164 02/09/2016 1135   TRIG 162 (H) 02/09/2016 1135   HDL 45 02/09/2016 1135   CHOLHDL 3.6 02/09/2016 1135   CHOLHDL 3.3 03/03/2015 0744   VLDL 22 03/03/2015 0744   LDLCALC 87 02/09/2016 1135   LDLDIRECT 91.0 02/05/2014 0809    Additional studies/ records that were reviewed today include:  No new functional data. Cardiac catheterization performed in February 2018: Coronary Diagrams   Diagnostic Diagram          ASSESSMENT:    1. Coronary artery disease involving coronary bypass graft of native heart with angina pectoris ([facility])   2. Chronic systolic heart failure (H[facility])   3. Ectopic atrial tachycardia ([facility])   4. HYPERTENSION, BENIGN SYSTEMIC    5. Other hyperlipidemia      PLAN:  In order of problems listed above:  1. Multivessel coronary disease with bypass graft failure. Was seen by Dr. Prescott Gum and not felt to be a repeat surgical candidate because of lack of conduits and risk. He currently has stable angina pectoris for which we will manage medically. We were going to attempt a second opinion for surgical revascularization but in absence of  significant symptoms have decided to be more conservative at this time. He has a very active lifestyle with minimal symptoms. 2. No volume overload or evidence of heart failure. Dr. Lovena Le has ordered a cardiac MRI to be performed in June. 3. No symptomatic recurrences. 4. Start lisinopril 20 mg per day. We should do a be met in September at time of return office visit. 5. Wall stenosis is a way to do more frequent lipid monitoring, perhaps at home.  Three-month clinical follow-up. Basic metabolic panel and bedtime. Call if angina.  Medication Adjustments/Labs and Tests Ordered: Current medicines are reviewed at length with the patient today.  Concerns regarding medicines are outlined above.  Medication changes, Labs and Tests ordered today are listed in the Patient Instructions below. There are no Patient Instructions on file for this visit.   Signed, Sinclair Grooms, MD  05/03/2016 8:31 AM    [facility] [address], [place], [place]  [address] Phone: [phone]; Fax: [phone]

## 2016-05-03 ENCOUNTER — Encounter: Payer: Self-pay | Admitting: Interventional Cardiology

## 2016-05-03 ENCOUNTER — Ambulatory Visit (INDEPENDENT_AMBULATORY_CARE_PROVIDER_SITE_OTHER): Payer: Medicare Other | Admitting: Interventional Cardiology

## 2016-05-03 VITALS — BP 138/90 | HR 64 | Ht 73.0 in | Wt 184.1 lb

## 2016-05-03 DIAGNOSIS — I5022 Chronic systolic (congestive) heart failure: Secondary | ICD-10-CM

## 2016-05-03 DIAGNOSIS — I10 Essential (primary) hypertension: Secondary | ICD-10-CM

## 2016-05-03 DIAGNOSIS — E784 Other hyperlipidemia: Secondary | ICD-10-CM | POA: Diagnosis not present

## 2016-05-03 DIAGNOSIS — I2510 Atherosclerotic heart disease of native coronary artery without angina pectoris: Secondary | ICD-10-CM | POA: Diagnosis not present

## 2016-05-03 DIAGNOSIS — I25709 Atherosclerosis of coronary artery bypass graft(s), unspecified, with unspecified angina pectoris: Secondary | ICD-10-CM | POA: Diagnosis not present

## 2016-05-03 DIAGNOSIS — E7849 Other hyperlipidemia: Secondary | ICD-10-CM

## 2016-05-03 DIAGNOSIS — I209 Angina pectoris, unspecified: Secondary | ICD-10-CM

## 2016-05-03 DIAGNOSIS — I471 Supraventricular tachycardia: Secondary | ICD-10-CM | POA: Diagnosis not present

## 2016-05-03 MED ORDER — LISINOPRIL 20 MG PO TABS: 20.0000 mg | ORAL_TABLET | Freq: Every day | ORAL | 3 refills | Status: DC

## 2016-05-03 NOTE — Patient Instructions (Signed)
Stop Entresto    Start Lisinopril 20 mg daily     Your physician wants you to follow-up in: Sept or early Oct. You will receive a reminder letter in the mail two months in advance. If you don't receive a letter, please call our office to schedule the follow-up appointment.

## 2016-05-15 DIAGNOSIS — S6992XA Unspecified injury of left wrist, hand and finger(s), initial encounter: Secondary | ICD-10-CM | POA: Diagnosis not present

## 2016-05-15 DIAGNOSIS — M79642 Pain in left hand: Secondary | ICD-10-CM | POA: Diagnosis not present

## 2016-05-25 ENCOUNTER — Other Ambulatory Visit: Payer: Self-pay | Admitting: Gastroenterology

## 2016-05-25 MED ORDER — OMEPRAZOLE 40 MG PO CPDR: 40.0000 mg | DELAYED_RELEASE_CAPSULE | Freq: Every day | ORAL | 0 refills | Status: DC

## 2016-05-25 NOTE — Telephone Encounter (Signed)
Received a fax refill from [facility] for Omeprazole # 90. Patient hasn't been seen since 02/2015 and was schedule for an Endoscopy that was canceled due to pat stating he wanted EGD in the Spring or Summer. So medication was sent to pharmacy for Omeprazole 40 mg #30 with 0 refills and a message for pt to contact office.

## 2016-06-21 ENCOUNTER — Other Ambulatory Visit: Payer: Self-pay

## 2016-06-21 MED ORDER — OMEPRAZOLE 40 MG PO CPDR: 40.0000 mg | DELAYED_RELEASE_CAPSULE | Freq: Every day | ORAL | 11 refills | Status: DC

## 2016-07-03 ENCOUNTER — Ambulatory Visit (HOSPITAL_COMMUNITY)
Admission: RE | Admit: 2016-07-03 | Discharge: 2016-07-03 | Disposition: A | Discharge status: Home / Self Care | Payer: Medicare Other | Source: Ambulatory Visit | Attending: Internal Medicine | Admitting: Internal Medicine

## 2016-07-03 DIAGNOSIS — I255 Ischemic cardiomyopathy: Secondary | ICD-10-CM | POA: Diagnosis not present

## 2016-07-03 DIAGNOSIS — I5022 Chronic systolic (congestive) heart failure: Secondary | ICD-10-CM | POA: Diagnosis not present

## 2016-07-03 LAB — CREATININE, SERUM
CREATININE: 1.01 mg/dL (ref 0.61–1.24)
GFR calc Af Amer: >60 mL/min (ref >60)

## 2016-07-03 MED ORDER — GADOBENATE DIMEGLUMINE 529 MG/ML IV SOLN
30.0000 mL | Freq: Once | INTRAVENOUS | Status: AC
  Administered 2016-07-03: 27 mL via INTRAVENOUS

## 2016-07-05 ENCOUNTER — Other Ambulatory Visit: Payer: Self-pay | Admitting: Interventional Cardiology

## 2016-07-06 ENCOUNTER — Ambulatory Visit (INDEPENDENT_AMBULATORY_CARE_PROVIDER_SITE_OTHER): Payer: Medicare Other | Admitting: Internal Medicine

## 2016-07-06 ENCOUNTER — Encounter: Payer: Self-pay | Admitting: Internal Medicine

## 2016-07-06 VITALS — BP 134/82 | HR 76 | Ht 72.0 in | Wt 182.2 lb

## 2016-07-06 DIAGNOSIS — I5022 Chronic systolic (congestive) heart failure: Secondary | ICD-10-CM

## 2016-07-06 DIAGNOSIS — I447 Left bundle-branch block, unspecified: Secondary | ICD-10-CM | POA: Diagnosis not present

## 2016-07-06 DIAGNOSIS — I2510 Atherosclerotic heart disease of native coronary artery without angina pectoris: Secondary | ICD-10-CM

## 2016-07-06 NOTE — Progress Notes (Signed)
HPI Tommy [name] returns today for ongoing evaluation of severe LV dysfunction and LBBB. He is a very healthy 66 yo man with CAD, s/p CABG, who has been a competitive runner for years. He noted that his exercise capacity decreased prior to his initial cardiac evaluation and he has subsequently undergone coronary stents and ultimately had CABG. He has had preserved LV function until recently when he had a 2D echo where his EF had reduced to 30%. He underwent repeat cath where he was noted to have severe native as well as graft disease. He was referred back to consider redo CABG but was not thought to have very good conduits. He was turned down. He has developed LBBB but has not had syncope. He can still run well though he has not been competing in marathons anymore. The patient has undergone a cardiac MRI which revealed diffuse HK and subendocardial scar. His QRS remains prolonged.  Allergies  Allergen Reactions  . Fish Oil     Gives a bad feeling     Current Outpatient Prescriptions  Medication Sig Dispense Refill  . aspirin 81 MG tablet Take 81 mg by mouth daily.    Marland Kitchen atorvastatin (LIPITOR) 10 MG tablet TAKE ONE TABLET BY MOUTH EVERY DAY AT 6 PM. 90 tablet 3  . carvedilol (COREG) 6.25 MG tablet Take 1 tablet (6.25 mg total) by mouth 2 (two) times daily. 180 tablet 3  . Cholecalciferol (VITAMIN D3) 5000 units CAPS Take 5,000 Units by mouth daily.    . clopidogrel (PLAVIX) 75 MG tablet TAKE 1 TABLET BY MOUTH DAILY WITH MEAL 90 tablet 3  . Dextromethorphan-Guaifenesin (MUCINEX DM MAXIMUM STRENGTH) 60-1200 MG TB12 Take 1 tablet by mouth daily as needed (congestion).    . FIBER PO Take 500 mg by mouth daily.    . finasteride (PROPECIA) 1 MG tablet Take 1 mg by mouth daily.  1  . lisinopril (PRINIVIL,ZESTRIL) 20 MG tablet Take 1 tablet (20 mg total) by mouth daily. 90 tablet 3  . niacinamide 500 MG tablet Take 500 mg by mouth daily.    . nitroGLYCERIN (NITROSTAT) 0.4 MG SL tablet Place 0.4 mg  under the tongue every 5 (five) minutes as needed for chest pain (MAX 3 TABLETS).    Marland Kitchen omeprazole (PRILOSEC) 40 MG capsule Take 1 capsule (40 mg total) by mouth daily. Pt needs to contact office for further refills 30 capsule 11  . oxymetazoline (AFRIN) 0.05 % nasal spray Place 1 spray into both nostrils at bedtime as needed for congestion.    . ranitidine (ZANTAC) 150 MG tablet Take 150 mg by mouth at bedtime.    . valACYclovir (VALTREX) 500 MG tablet Take 500 mg by mouth 2 (two) times daily.  1  . vitamin C (ASCORBIC ACID) 500 MG tablet Take 500 mg by mouth daily.    . Zinc 50 MG TABS Take 50 mg by mouth daily.      No current facility-administered medications for this visit.      Past Medical History:  Diagnosis Date  . Blood transfusion without reported diagnosis   . CAD (coronary artery disease)    with CABG LIMA to LAD, free radial PDA, SVG to diagonal, SVG to OM. 2002. DES May 2011 and 01-2010 to SVG of Diag, native OM, and Native RCA. Unable to stent LAD via LIMA  . GERD (gastroesophageal reflux disease)   . Hyperlipidemia    unable to tolarate statin therapy  . Kidney stone   .  Vertigo     ROS:   All systems reviewed and negative except as noted in the HPI.   Past Surgical History:  Procedure Laterality Date  . CHOLECYSTECTOMY    . COLONOSCOPY    . CORONARY ARTERY BYPASS GRAFT    . RIGHT/LEFT HEART CATH AND CORONARY/GRAFT ANGIOGRAPHY N/A 03/01/2016   Procedure: Right/Left Heart Cath and Coronary/Graft Angiography;  Surgeon: Belva Crome, MD;  Location: [facility];  Service: Cardiovascular;  Laterality: N/A;     Family History  Problem Relation Age of Onset  . Colon cancer Father   . Sudden death Neg Hx   . Hypertension Neg Hx   . Hyperlipidemia Neg Hx   . Heart attack Neg Hx   . Diabetes Neg Hx   . Rectal cancer Neg Hx   . Stomach cancer Neg Hx      Social History   Social History  . Marital status: Married    Spouse name: N/A  . Number of  children: N/A  . Years of education: N/A   Occupational History  . Not on file.   Social History Main Topics  . Smoking status: Never Smoker  . Smokeless tobacco: Never Used  . Alcohol use 0.6 oz/week    1 Glasses of wine per week     Comment: prn  . Drug use: No  . Sexual activity: Not on file   Other Topics Concern  . Not on file   Social History Narrative  . No narrative on file     BP 134/82   Pulse 76   Ht 6' (1.829 m)   Wt 182 lb 3.2 oz (82.6 kg)   SpO2 97%   BMI 24.71 kg/m   Physical Exam:  Well appearing 66 yo man, NAD HEENT: Unremarkable Neck:  6 cm JVD, no thyromegally Lymphatics:  No adenopathy Back:  No CVA tenderness Lungs:  Clear with no wheezes HEART:  Regular rate rhythm, no murmurs, no rubs, no clicks Abd:  soft, positive bowel sounds, no organomegally, no rebound, no guarding Ext:  2 plus pulses, no edema, no cyanosis, no clubbing Skin:  No rashes no nodules Neuro:  CN II through XII intact, motor grossly intact  EKG - nsr with LBBB   Assess/Plan: 1. 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 I have discussed the treatment options with the patient and his wife. At this point his EF is severely depressed and I wonder if he has a component of a non-ischemic CM at play. He has a class 1 indication for an ICD and class 2 indication for a BiV. I have discussed the risks/benefits/goals/expectation of ICD insertion and he wishes to proceed.  2. LBBB - he is not symptomatic. He will undergo observation. 3. HTN - his blood pressure is controlled. Will continue his current meds.  Mikle Bosworth.D.

## 2016-07-06 NOTE — Patient Instructions (Addendum)
Medication Instructions:  Your physician recommends that you continue on your current medications as directed. Please refer to the Current Medication list given to you today.   Labwork: None Ordered   Testing/Procedures: Your physician has recommended that you have a defibrillator inserted. An implantable cardioverter defibrillator (ICD) is a small device that is placed in your chest or, in rare cases, your abdomen. This device uses electrical pulses or shocks to help control life-threatening, irregular heartbeats that could lead the heart to suddenly stop beating (sudden cardiac arrest). Leads are attached to the ICD that goes into your heart. This is done in the hospital and usually requires an overnight stay. Please see the instruction sheet given to you today for more information.--Bi-V ICD [place]  Possible Dates: 7/3, 7/6, 7/11, 7/13, 7/18, 7/19, 7/23, 7/27, 8/1, 8/3, 8/22, 8/23, 8/27, 8/31 Please call our office to schedule - [phone]  Follow-Up: Follow-up to be determined   Any Other Special Instructions Will Be Listed Below (If Applicable). Gave CHG Soap and instructions    If you need a refill on your cardiac medications before your next appointment, please call your pharmacy.

## 2016-07-07 ENCOUNTER — Ambulatory Visit: Payer: Medicare Other | Admitting: Internal Medicine

## 2016-07-07 ENCOUNTER — Telehealth: Payer: Self-pay | Admitting: Internal Medicine

## 2016-07-07 ENCOUNTER — Other Ambulatory Visit: Payer: Medicare Other | Admitting: *Deleted

## 2016-07-07 DIAGNOSIS — Z01812 Encounter for preprocedural laboratory examination: Secondary | ICD-10-CM | POA: Diagnosis not present

## 2016-07-07 DIAGNOSIS — I5022 Chronic systolic (congestive) heart failure: Secondary | ICD-10-CM

## 2016-07-07 LAB — CBC WITH DIFFERENTIAL/PLATELET
Basophils Absolute: 0 10*3/uL (ref 0.0–0.2)
Basos: 1 %
EOS (ABSOLUTE): 0.2 10*3/uL (ref 0.0–0.4)
Eos: 3 %
Hematocrit: 39.8 % (ref 37.5–51.0)
Hemoglobin: 13.5 g/dL (ref 13.0–17.7)
Immature Grans (Abs): 0 10*3/uL (ref 0.0–0.1)
Immature Granulocytes: 0 %
LYMPHS ABS: 2.7 10*3/uL (ref 0.7–3.1)
Lymphs: 32 %
MCH: 30.2 pg (ref 26.6–33.0)
MCHC: 33.9 g/dL (ref 31.5–35.7)
MCV: 89 fL (ref 79–97)
MONOS ABS: 0.8 10*3/uL (ref 0.1–0.9)
Monocytes: 10 %
NEUTROS ABS: 4.5 10*3/uL (ref 1.4–7.0)
Neutrophils: 54 %
PLATELETS: 317 10*3/uL (ref 150–379)
RBC: 4.47 x10E6/uL (ref 4.14–5.80)
RDW: 12.9 % (ref 12.3–15.4)
WBC: 8.3 10*3/uL (ref 3.4–10.8)

## 2016-07-07 LAB — BASIC METABOLIC PANEL
BUN / CREAT RATIO: 24 (ref 10–24)
BUN: 22 mg/dL (ref 8–27)
CALCIUM: 9.4 mg/dL (ref 8.6–10.2)
CO2: 23 mmol/L (ref 20–29)
Chloride: 101 mmol/L (ref 96–106)
Creatinine, Ser: 0.9 mg/dL (ref 0.76–1.27)
GFR calc non Af Amer: 89 mL/min/{1.73_m2} (ref >59)
GFR, EST AFRICAN AMERICAN: 103 mL/min/{1.73_m2} (ref >59)
Glucose: 92 mg/dL (ref 65–99)
POTASSIUM: 4.3 mmol/L (ref 3.5–5.2)
SODIUM: 139 mmol/L (ref 134–144)

## 2016-07-07 NOTE — Telephone Encounter (Signed)
°  New Prob   Pt requesting to speak to nurse regarding scheduling a procedure. Please call.

## 2016-07-07 NOTE — Telephone Encounter (Signed)
Called, spoke with pt. Pt informed he would like to schedule procedure for 07/11/16. Informed I will forward to Dr. Lovena Le to advise (Dr. Lovena Le wanted to speak with Dr. Tamala Julian prior to  . Informed pt can come in for pre-procedure labs today. Pt agreed. Informed I will contact pt back on Monday, after speaking with Dr. Lovena Le.

## 2016-07-10 NOTE — Telephone Encounter (Signed)
Called, spoke with pt. Dr. Lovena Le spoke with Dr. Tamala Julian. Dr. Tamala Julian did not recommend cath/stent. He recommended prodceding with Bi-V ICD implant. Scheduled pt for Bi-V ICD tomorrow (07/11/16), arriving to the hospital at 10:00 AM.  Follow-up for a wound check in the [facility] 10-14 days from date of procedure and with Dr. Lovena Le 91 days from date of implant.. Please wash with the CHG Soap the night before and morning of procedure (follow instruction page "Preparing For Surgery"). Pt will HOLD Plavix 2 days prior. Report to the [facility] at 10:00 AM Nothing to eat or drink after midnight the night before procedure Do not take any medication the morning prior to procedure Plan 1 night stay  Pt verbalized understanding and thanked me for calling.

## 2016-07-11 ENCOUNTER — Encounter (HOSPITAL_COMMUNITY)
Admission: RE | Disposition: A | Discharge status: Home / Self Care | Payer: Self-pay | Source: Ambulatory Visit | Attending: Internal Medicine

## 2016-07-11 ENCOUNTER — Ambulatory Visit (HOSPITAL_COMMUNITY)
Admission: RE | Admit: 2016-07-11 | Discharge: 2016-07-12 | Disposition: A | Discharge status: Home / Self Care | Payer: Medicare Other | Source: Ambulatory Visit | Attending: Internal Medicine | Admitting: Internal Medicine

## 2016-07-11 ENCOUNTER — Ambulatory Visit (HOSPITAL_COMMUNITY): Payer: Medicare Other

## 2016-07-11 ENCOUNTER — Encounter (HOSPITAL_COMMUNITY): Payer: Self-pay | Admitting: *Deleted

## 2016-07-11 DIAGNOSIS — I38 Endocarditis, valve unspecified: Secondary | ICD-10-CM | POA: Diagnosis present

## 2016-07-11 DIAGNOSIS — E785 Hyperlipidemia, unspecified: Secondary | ICD-10-CM | POA: Insufficient documentation

## 2016-07-11 DIAGNOSIS — Z951 Presence of aortocoronary bypass graft: Secondary | ICD-10-CM | POA: Insufficient documentation

## 2016-07-11 DIAGNOSIS — I5022 Chronic systolic (congestive) heart failure: Secondary | ICD-10-CM | POA: Diagnosis not present

## 2016-07-11 DIAGNOSIS — Z7902 Long term (current) use of antithrombotics/antiplatelets: Secondary | ICD-10-CM | POA: Insufficient documentation

## 2016-07-11 DIAGNOSIS — Z7982 Long term (current) use of aspirin: Secondary | ICD-10-CM | POA: Diagnosis not present

## 2016-07-11 DIAGNOSIS — Z79899 Other long term (current) drug therapy: Secondary | ICD-10-CM | POA: Insufficient documentation

## 2016-07-11 DIAGNOSIS — I110 Hypertensive heart disease with heart failure: Secondary | ICD-10-CM | POA: Diagnosis not present

## 2016-07-11 DIAGNOSIS — I2510 Atherosclerotic heart disease of native coronary artery without angina pectoris: Secondary | ICD-10-CM | POA: Insufficient documentation

## 2016-07-11 DIAGNOSIS — R609 Edema, unspecified: Secondary | ICD-10-CM

## 2016-07-11 DIAGNOSIS — I255 Ischemic cardiomyopathy: Secondary | ICD-10-CM | POA: Diagnosis not present

## 2016-07-11 DIAGNOSIS — I447 Left bundle-branch block, unspecified: Secondary | ICD-10-CM | POA: Insufficient documentation

## 2016-07-11 DIAGNOSIS — Z006 Encounter for examination for normal comparison and control in clinical research program: Secondary | ICD-10-CM | POA: Insufficient documentation

## 2016-07-11 DIAGNOSIS — Z95810 Presence of automatic (implantable) cardiac defibrillator: Secondary | ICD-10-CM

## 2016-07-11 DIAGNOSIS — Z8249 Family history of ischemic heart disease and other diseases of the circulatory system: Secondary | ICD-10-CM | POA: Insufficient documentation

## 2016-07-11 DIAGNOSIS — K219 Gastro-esophageal reflux disease without esophagitis: Secondary | ICD-10-CM | POA: Insufficient documentation

## 2016-07-11 DIAGNOSIS — I517 Cardiomegaly: Secondary | ICD-10-CM | POA: Diagnosis not present

## 2016-07-11 HISTORY — PX: BIV ICD INSERTION CRT-D: EP1195

## 2016-07-11 LAB — SURGICAL PCR SCREEN
MRSA, PCR: NEGATIVE
Staphylococcus aureus: NEGATIVE

## 2016-07-11 SURGERY — BIV ICD INSERTION CRT-D

## 2016-07-11 MED ORDER — CARVEDILOL 6.25 MG PO TABS
6.2500 mg | ORAL_TABLET | Freq: Two times a day (BID) | ORAL | Status: DC
  Administered 2016-07-11 – 2016-07-12 (×2): 6.25 mg via ORAL
  Filled 2016-07-11 (×2): qty 1

## 2016-07-11 MED ORDER — CHLORHEXIDINE GLUCONATE 4 % EX LIQD: 60.0000 mL | Freq: Once | CUTANEOUS | Status: DC

## 2016-07-11 MED ORDER — OXYMETAZOLINE HCL 0.05 % NA SOLN
1.0000 | Freq: Every evening | NASAL | Status: DC | PRN
  Filled 2016-07-11: qty 15

## 2016-07-11 MED ORDER — LIDOCAINE HCL (PF) 1 % IJ SOLN
INTRAMUSCULAR | Status: AC
  Filled 2016-07-11: qty 60

## 2016-07-11 MED ORDER — LISINOPRIL 20 MG PO TABS
20.0000 mg | ORAL_TABLET | Freq: Every day | ORAL | Status: DC
  Administered 2016-07-11: 20 mg via ORAL
  Filled 2016-07-11 (×2): qty 1

## 2016-07-11 MED ORDER — HEPARIN (PORCINE) IN NACL 2-0.9 UNIT/ML-% IJ SOLN
INTRAMUSCULAR | Status: AC | PRN
  Administered 2016-07-11: 500 mL

## 2016-07-11 MED ORDER — ZINC SULFATE 220 (50 ZN) MG PO CAPS
220.0000 mg | ORAL_CAPSULE | Freq: Every day | ORAL | Status: DC
  Administered 2016-07-11 – 2016-07-12 (×2): 220 mg via ORAL
  Filled 2016-07-11 (×2): qty 1

## 2016-07-11 MED ORDER — VITAMIN C 500 MG PO TABS
500.0000 mg | ORAL_TABLET | Freq: Every day | ORAL | Status: DC
  Administered 2016-07-11 – 2016-07-12 (×2): 500 mg via ORAL
  Filled 2016-07-11 (×2): qty 1

## 2016-07-11 MED ORDER — MIDAZOLAM HCL 5 MG/5ML IJ SOLN
INTRAMUSCULAR | Status: AC
  Filled 2016-07-11: qty 5

## 2016-07-11 MED ORDER — IBUPROFEN 200 MG PO TABS: 200.0000 mg | ORAL_TABLET | Freq: Every day | ORAL | Status: DC | PRN

## 2016-07-11 MED ORDER — NIACINAMIDE 500 MG PO TABS: 500.0000 mg | ORAL_TABLET | Freq: Every day | ORAL | Status: DC

## 2016-07-11 MED ORDER — VITAMIN D 1000 UNITS PO TABS
5000.0000 [IU] | ORAL_TABLET | Freq: Every day | ORAL | Status: DC
  Administered 2016-07-11 – 2016-07-12 (×2): 5000 [IU] via ORAL
  Filled 2016-07-11 (×2): qty 5

## 2016-07-11 MED ORDER — SODIUM CHLORIDE 0.9 % IR SOLN
Status: AC
  Filled 2016-07-11: qty 2

## 2016-07-11 MED ORDER — PANTOPRAZOLE SODIUM 40 MG PO TBEC
40.0000 mg | DELAYED_RELEASE_TABLET | Freq: Every day | ORAL | Status: DC
  Administered 2016-07-11 – 2016-07-12 (×2): 40 mg via ORAL
  Filled 2016-07-11 (×2): qty 1

## 2016-07-11 MED ORDER — SODIUM CHLORIDE 0.9 % IV SOLN
INTRAVENOUS | Status: DC
  Administered 2016-07-11: 11:00:00 via INTRAVENOUS

## 2016-07-11 MED ORDER — FINASTERIDE 1 MG PO TABS: 1.0000 mg | ORAL_TABLET | Freq: Every day | ORAL | Status: DC

## 2016-07-11 MED ORDER — CEFAZOLIN SODIUM-DEXTROSE 2-4 GM/100ML-% IV SOLN
INTRAVENOUS | Status: AC
  Filled 2016-07-11: qty 100

## 2016-07-11 MED ORDER — HEPARIN (PORCINE) IN NACL 2-0.9 UNIT/ML-% IJ SOLN
INTRAMUSCULAR | Status: AC
  Filled 2016-07-11: qty 500

## 2016-07-11 MED ORDER — MUPIROCIN 2 % EX OINT
1.0000 "application " | TOPICAL_OINTMENT | Freq: Once | CUTANEOUS | Status: AC
  Administered 2016-07-11: 1 via TOPICAL

## 2016-07-11 MED ORDER — NITROGLYCERIN 0.4 MG SL SUBL: 0.4000 mg | SUBLINGUAL_TABLET | SUBLINGUAL | Status: DC | PRN

## 2016-07-11 MED ORDER — ACETAMINOPHEN 325 MG PO TABS
325.0000 mg | ORAL_TABLET | ORAL | Status: DC | PRN
  Administered 2016-07-11 (×2): 650 mg via ORAL
  Administered 2016-07-12: 325 mg via ORAL
  Filled 2016-07-11 (×3): qty 2

## 2016-07-11 MED ORDER — MIDAZOLAM HCL 5 MG/5ML IJ SOLN
INTRAMUSCULAR | Status: DC | PRN
  Administered 2016-07-11 (×8): 1 mg via INTRAVENOUS

## 2016-07-11 MED ORDER — MUPIROCIN 2 % EX OINT
TOPICAL_OINTMENT | CUTANEOUS | Status: AC
  Administered 2016-07-11: 1 via TOPICAL
  Filled 2016-07-11: qty 22

## 2016-07-11 MED ORDER — CEFAZOLIN SODIUM-DEXTROSE 2-4 GM/100ML-% IV SOLN
2.0000 g | INTRAVENOUS | Status: AC
  Administered 2016-07-11: 2 g via INTRAVENOUS

## 2016-07-11 MED ORDER — IOPAMIDOL (ISOVUE-370) INJECTION 76%
INTRAVENOUS | Status: DC | PRN
  Administered 2016-07-11: 20 mL via INTRAVENOUS

## 2016-07-11 MED ORDER — FENTANYL CITRATE (PF) 100 MCG/2ML IJ SOLN
INTRAMUSCULAR | Status: AC
  Filled 2016-07-11: qty 2

## 2016-07-11 MED ORDER — SODIUM CHLORIDE 0.9 % IR SOLN
80.0000 mg | Status: AC
  Administered 2016-07-11: 80 mg

## 2016-07-11 MED ORDER — FENTANYL CITRATE (PF) 100 MCG/2ML IJ SOLN
INTRAMUSCULAR | Status: DC | PRN
  Administered 2016-07-11 (×6): 12.5 ug via INTRAVENOUS
  Administered 2016-07-11: 25 ug via INTRAVENOUS

## 2016-07-11 MED ORDER — ONDANSETRON HCL 4 MG/2ML IJ SOLN: 4.0000 mg | Freq: Four times a day (QID) | INTRAMUSCULAR | Status: DC | PRN

## 2016-07-11 MED ORDER — ATORVASTATIN CALCIUM 10 MG PO TABS
10.0000 mg | ORAL_TABLET | Freq: Every day | ORAL | Status: DC
  Administered 2016-07-11: 10 mg via ORAL
  Filled 2016-07-11: qty 1

## 2016-07-11 MED ORDER — CEFAZOLIN SODIUM-DEXTROSE 1-4 GM/50ML-% IV SOLN
1.0000 g | Freq: Four times a day (QID) | INTRAVENOUS | Status: AC
  Administered 2016-07-11 – 2016-07-12 (×3): 1 g via INTRAVENOUS
  Filled 2016-07-11 (×3): qty 50

## 2016-07-11 MED ORDER — LIDOCAINE HCL (PF) 1 % IJ SOLN
INTRAMUSCULAR | Status: DC | PRN
  Administered 2016-07-11: 45 mL

## 2016-07-11 SURGICAL SUPPLY — 17 items
CABLE SURGICAL S-101-97-12 (CABLE) ×2 IMPLANT
CATH ACUITYPRO 45CM H 9F (CATHETERS) ×2 IMPLANT
CATH HEX JOSEPH 2-5-2 65CM 6F (CATHETERS) ×2 IMPLANT
GUIDEWIRE ANGLED .035X150CM (WIRE) ×2 IMPLANT
ICD VIGILANT DF4 G247 (ICD Generator) ×2 IMPLANT
INGEVITY MRI 7741-52CM (Lead) ×3 IMPLANT
LEAD ACUITY X4 4674 (Lead) ×2 IMPLANT
LEAD PACING INGEVITY MRI 52CM (Lead) IMPLANT
LEAD RELIANCE G DF4 0293 (Lead) ×2 IMPLANT
PAD DEFIB LIFELINK (PAD) ×2 IMPLANT
SHEATH CLASSIC 7F (SHEATH) ×2 IMPLANT
SHEATH CLASSIC 9.5F (SHEATH) ×2 IMPLANT
SHEATH CLASSIC 9F (SHEATH) ×2 IMPLANT
TRAY PACEMAKER INSERTION (PACKS) ×2 IMPLANT
WIRE ACUITY WHISPER EDS 4648 (WIRE) ×2 IMPLANT
WIRE HI TORQ VERSACORE-J 145CM (WIRE) ×2 IMPLANT
WIRE LUGE 182CM (WIRE) ×2 IMPLANT

## 2016-07-11 NOTE — Interval H&P Note (Signed)
History and Physical Interval Note:  07/11/2016 12:46 PM  Tommy [name]  has presented today for surgery, with the diagnosis of chronic systolic HF  The various methods of treatment have been discussed with the patient and family. After consideration of risks, benefits and other options for treatment, the patient has consented to  Procedure(s): BiV ICD Insertion CRT-D (N/A) as a surgical intervention .  The patient's history has been reviewed, patient examined, no change in status, stable for surgery.  I have reviewed the patient's chart and labs.  Questions were answered to the patient's satisfaction.     Cristopher Peru

## 2016-07-11 NOTE — Progress Notes (Signed)
Left Upper chest incision site noted to be bruised and more swollen compared earlier.  Cardiology PA paged and made aware, requesting to come and see incision site.. Endorsed accordingly.

## 2016-07-11 NOTE — H&P (View-Only) (Signed)
HPI [name] returns today for ongoing evaluation of severe LV dysfunction and LBBB. He is a very healthy 66 yo man with CAD, s/p CABG, who has been a competitive runner for years. He noted that his exercise capacity decreased prior to his initial cardiac evaluation and he has subsequently undergone coronary stents and ultimately had CABG. He has had preserved LV function until recently when he had a 2D echo where his EF had reduced to 30%. He underwent repeat cath where he was noted to have severe native as well as graft disease. He was referred back to consider redo CABG but was not thought to have very good conduits. He was turned down. He has developed LBBB but has not had syncope. He can still run well though he has not been competing in marathons anymore. The patient has undergone a cardiac MRI which revealed diffuse HK and subendocardial scar. His QRS remains prolonged.  Allergies  Allergen Reactions  . Fish Oil     Gives a bad feeling     Current Outpatient Prescriptions  Medication Sig Dispense Refill  . aspirin 81 MG tablet Take 81 mg by mouth daily.    Marland Kitchen atorvastatin (LIPITOR) 10 MG tablet TAKE ONE TABLET BY MOUTH EVERY DAY AT 6 PM. 90 tablet 3  . carvedilol (COREG) 6.25 MG tablet Take 1 tablet (6.25 mg total) by mouth 2 (two) times daily. 180 tablet 3  . Cholecalciferol (VITAMIN D3) 5000 units CAPS Take 5,000 Units by mouth daily.    . clopidogrel (PLAVIX) 75 MG tablet TAKE 1 TABLET BY MOUTH DAILY WITH MEAL 90 tablet 3  . Dextromethorphan-Guaifenesin (MUCINEX DM MAXIMUM STRENGTH) 60-1200 MG TB12 Take 1 tablet by mouth daily as needed (congestion).    . FIBER PO Take 500 mg by mouth daily.    . finasteride (PROPECIA) 1 MG tablet Take 1 mg by mouth daily.  1  . lisinopril (PRINIVIL,ZESTRIL) 20 MG tablet Take 1 tablet (20 mg total) by mouth daily. 90 tablet 3  . niacinamide 500 MG tablet Take 500 mg by mouth daily.    . nitroGLYCERIN (NITROSTAT) 0.4 MG SL tablet Place 0.4 mg  under the tongue every 5 (five) minutes as needed for chest pain (MAX 3 TABLETS).    Marland Kitchen omeprazole (PRILOSEC) 40 MG capsule Take 1 capsule (40 mg total) by mouth daily. Pt needs to contact office for further refills 30 capsule 11  . oxymetazoline (AFRIN) 0.05 % nasal spray Place 1 spray into both nostrils at bedtime as needed for congestion.    . ranitidine (ZANTAC) 150 MG tablet Take 150 mg by mouth at bedtime.    . valACYclovir (VALTREX) 500 MG tablet Take 500 mg by mouth 2 (two) times daily.  1  . vitamin C (ASCORBIC ACID) 500 MG tablet Take 500 mg by mouth daily.    . Zinc 50 MG TABS Take 50 mg by mouth daily.      No current facility-administered medications for this visit.      Past Medical History:  Diagnosis Date  . Blood transfusion without reported diagnosis   . CAD (coronary artery disease)    with CABG LIMA to LAD, free radial PDA, SVG to diagonal, SVG to OM. 2002. DES May 2011 and 01-2010 to SVG of Diag, native OM, and Native RCA. Unable to stent LAD via LIMA  . GERD (gastroesophageal reflux disease)   . Hyperlipidemia    unable to tolarate statin therapy  . Kidney stone   .  Vertigo     ROS:   All systems reviewed and negative except as noted in the HPI.   Past Surgical History:  Procedure Laterality Date  . CHOLECYSTECTOMY    . COLONOSCOPY    . CORONARY ARTERY BYPASS GRAFT    . RIGHT/LEFT HEART CATH AND CORONARY/GRAFT ANGIOGRAPHY N/A 03/01/2016   Procedure: Right/Left Heart Cath and Coronary/Graft Angiography;  Surgeon: Belva Crome, MD;  Location: [facility];  Service: Cardiovascular;  Laterality: N/A;     Family History  Problem Relation Age of Onset  . Colon cancer Father   . Sudden death Neg Hx   . Hypertension Neg Hx   . Hyperlipidemia Neg Hx   . Heart attack Neg Hx   . Diabetes Neg Hx   . Rectal cancer Neg Hx   . Stomach cancer Neg Hx      Social History   Social History  . Marital status: Married    Spouse name: N/A  . Number of  children: N/A  . Years of education: N/A   Occupational History  . Not on file.   Social History Main Topics  . Smoking status: Never Smoker  . Smokeless tobacco: Never Used  . Alcohol use 0.6 oz/week    1 Glasses of wine per week     Comment: prn  . Drug use: No  . Sexual activity: Not on file   Other Topics Concern  . Not on file   Social History Narrative  . No narrative on file     BP 134/82   Pulse 76   Ht 6' (1.829 m)   Wt 182 lb 3.2 oz (82.6 kg)   SpO2 97%   BMI 24.71 kg/m   Physical Exam:  Well appearing 66 yo man, NAD HEENT: Unremarkable Neck:  6 cm JVD, no thyromegally Lymphatics:  No adenopathy Back:  No CVA tenderness Lungs:  Clear with no wheezes HEART:  Regular rate rhythm, no murmurs, no rubs, no clicks Abd:  soft, positive bowel sounds, no organomegally, no rebound, no guarding Ext:  2 plus pulses, no edema, no cyanosis, no clubbing Skin:  No rashes no nodules Neuro:  CN II through XII intact, motor grossly intact  EKG - nsr with LBBB   Assess/Plan: 1. 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 I have discussed the treatment options with the patient and his wife. At this point his EF is severely depressed and I wonder if he has a component of a non-ischemic CM at play. He has a class 1 indication for an ICD and class 2 indication for a BiV. I have discussed the risks/benefits/goals/expectation of ICD insertion and he wishes to proceed.  2. LBBB - he is not symptomatic. He will undergo observation. 3. HTN - his blood pressure is controlled. Will continue his current meds.  Mikle Bosworth.D.

## 2016-07-11 NOTE — Progress Notes (Signed)
Orthopedic Tech Progress Note Patient Details:  Tommy [name] 02/23/1950 [id number] Patient has sling. Patient ID: Tommy [name], male   DOB: [birth year], 66 y.o.   MRN: [id number]   [name] 07/11/2016, 7:11 PM

## 2016-07-11 NOTE — Progress Notes (Signed)
    Pt with swelling to area of ICD placement. C/o of mild pain at site. No bleeding noted, but bruising around site. Will place pressure dressing. Check CXR. Instructed RN to follow up if not improving.   SignedJosuel, [place], NP-C 07/11/2016, 7:55 PM Pager: [phone]

## 2016-07-11 NOTE — Progress Notes (Signed)
Received pt post ICD placement, left upper chest dressing clean dry and intact, no hematoma noted. Patient alert and oriented wife at bedside. No complaints of any pain or discomfort at this time. Will monitor accordingly.

## 2016-07-11 NOTE — Discharge Summary (Addendum)
ELECTROPHYSIOLOGY PROCEDURE DISCHARGE SUMMARY   NOTE prepared by Chanetta Marshall and Cecilie Kicks, [facility]  Patient ID: Tommy [name],  MRN: [id number], DOB/AGE: July 05, 1950 66 y.o.  Admit date: 07/11/2016 Discharge date: 07/12/2016  Primary Care Physician: Patient, No Pcp Per Primary Cardiologist: Tamala Julian Electrophysiologist: Lovena Le  Primary Discharge Diagnosis:  ICM, CHF, LBBB s/p CRTD implant this admission  Secondary Discharge Diagnosis:  1.  HTN 2.  GERD 3.  CAD s/p CABG  Allergies  Allergen Reactions  . Fish Oil Other (See Comments)    FEELS BAD      Procedures This Admission:  1.  Implantation of a BSX CRTD on 07/11/16 by Dr Lovena Le. See op note for full details. There were no immediate post procedure complications. 2.  CXR on 07/12/16 demonstrated no pneumothorax status post device implantation.   Brief HPI: Tommy [name] is a 66 y.o. male was referred to electrophysiology in the outpatient setting for consideration of ICD implantation.  Past medical history includes ICM, CHF, LBBB.  The patient has persistent LV dysfunction despite guideline directed therapy.  Risks, benefits, and alternatives to ICD implantation were reviewed with the patient who wished to proceed.   Hospital Course:  The patient was admitted and underwent implantation of a BSX CRTD with details as outlined above. He was monitored on telemetry overnight which demonstrated SR with BiV pacing.  Left chest was without hematoma or ecchymosis.  The device was interrogated and found to be functioning normally.  CXR was obtained and demonstrated no pneumothorax status post device implantation.  Decreasing subcutaneous emphysema in Lt lower neck.   Wound care, arm mobility, and restrictions were reviewed with the patient.  The patient was examined and considered stable for discharge to home.   The patient's discharge medications include an ACE-I (Lisinopril) and beta blocker (Coreg).   Physical Exam: Vitals:     07/11/16 1955 07/11/16 2028 07/12/16 0052 07/12/16 0528  BP: 136/78 120/72 130/79 129/76  Pulse: 96 89 77 78  Resp: 18 16 17 18   Temp:  98.4 F (36.9 C) 98.3 F (36.8 C) 97.8 F (36.6 C)  TempSrc:  Oral Oral Oral  SpO2: 99% 96% 97% 98%  Weight:    182 lb 6.4 oz (82.7 kg)  Height:       Per Dr. Rosezetta Schlatter- The patient is well appearing, alert and oriented x 3 today.   HEENT: normocephalic, atraumatic; sclera clear, conjunctiva pink; hearing intact; oropharynx clear; neck supple  Lungs- Clear to ausculation bilaterally, normal work of breathing.  No wheezes, rales, rhonchi Heart- Regular rate and rhythm, no murmurs, rubs or gallops  GI- soft, non-tender, non-distended, bowel sounds present  Extremities- no clubbing, cyanosis, or edema  MS- no significant deformity or atrophy Skin- warm and dry, no rash or lesion, left chest with hematoma/ecchymosis at site. Psych- euthymic mood, full affect Neuro- strength and sensation are intact   Labs:   Lab Results  Component Value Date   WBC 8.3 07/07/2016   HGB 13.5 07/07/2016   HCT 39.8 07/07/2016   MCV 89 07/07/2016   PLT 317 07/07/2016     Recent Labs Lab 07/07/16 1128  NA 139  K 4.3  CL 101  CO2 23  BUN 22  CREATININE 0.90  CALCIUM 9.4  GLUCOSE 92    Discharge Medications:  Allergies as of 07/12/2016      Reactions   Fish Oil Other (See Comments)   FEELS BAD  Medication List    STOP taking these medications   aspirin 81 MG tablet   clopidogrel 75 MG tablet Commonly known as:  PLAVIX   ibuprofen 200 MG tablet Commonly known as:  ADVIL,MOTRIN     TAKE these medications   acetaminophen 325 MG tablet Commonly known as:  TYLENOL Take 1-2 tablets (325-650 mg total) by mouth every 4 (four) hours as needed for mild pain.   atorvastatin 10 MG tablet Commonly known as:  LIPITOR TAKE ONE TABLET BY MOUTH EVERY DAY AT 6 PM.   carvedilol 6.25 MG tablet Commonly known as:  COREG Take 1 tablet (6.25 mg  total) by mouth 2 (two) times daily.   finasteride 1 MG tablet Commonly known as:  PROPECIA Take 1 mg by mouth daily.   lisinopril 20 MG tablet Commonly known as:  PRINIVIL,ZESTRIL Take 1 tablet (20 mg total) by mouth daily.   niacinamide 500 MG tablet Take 500 mg by mouth daily.   nitroGLYCERIN 0.4 MG SL tablet Commonly known as:  NITROSTAT Place 0.4 mg under the tongue every 5 (five) minutes as needed for chest pain (MAX 3 TABLETS).   omeprazole 40 MG capsule Commonly known as:  PRILOSEC Take 1 capsule (40 mg total) by mouth daily. Pt needs to contact office for further refills   oxymetazoline 0.05 % nasal spray Commonly known as:  AFRIN Place 1 spray into both nostrils at bedtime as needed for congestion.   ranitidine 150 MG tablet Commonly known as:  ZANTAC Take 1 tablet (150 mg total) by mouth at bedtime. Only if needed. What changed:  additional instructions   vitamin C 500 MG tablet Commonly known as:  ASCORBIC ACID Take 500 mg by mouth daily.   Vitamin D3 5000 units Caps Take 5,000 Units by mouth daily.   Zinc 50 MG Tabs Take 50 mg by mouth daily.       Disposition:   Follow-up Information    [facility] Follow up on 07/24/2016.   Specialty:  Cardiology Why:  at [facility] for wound check  Contact information: [address] [place]       Evans Lance, MD Follow up on 10/17/2016.   Specialty:  Cardiology Why:  at 8:45AM Contact information: [address] [place] [place] [address] [phone]          Do Not Take Asprin and Plavix until see in the office for wound check.   Heart Healthy diet   IT would also be best to only use tylenol for pain, Ibuprofen can increase bleeding as well.  At least until follow up site check.   Duration of Discharge Encounter: Greater than 30 minutes including physician time.  Signed, Chanetta Marshall, NP 07/12/2016 8:52  AM  EP Attending Patient seen and examined. Agree with above. He has a small hematoma post op. I have asked him to hold his ASA and plavix until he comes back for an incision check. He is biv pacing nicely. His device was reprogrammed under my direction and QRS has gone from 160 to 140.   Mikle Bosworth.D.

## 2016-07-12 ENCOUNTER — Ambulatory Visit (HOSPITAL_COMMUNITY): Payer: Medicare Other

## 2016-07-12 ENCOUNTER — Other Ambulatory Visit: Payer: Self-pay

## 2016-07-12 DIAGNOSIS — I110 Hypertensive heart disease with heart failure: Secondary | ICD-10-CM | POA: Diagnosis not present

## 2016-07-12 DIAGNOSIS — Z951 Presence of aortocoronary bypass graft: Secondary | ICD-10-CM | POA: Diagnosis not present

## 2016-07-12 DIAGNOSIS — Z79899 Other long term (current) drug therapy: Secondary | ICD-10-CM | POA: Diagnosis not present

## 2016-07-12 DIAGNOSIS — Z7902 Long term (current) use of antithrombotics/antiplatelets: Secondary | ICD-10-CM | POA: Diagnosis not present

## 2016-07-12 DIAGNOSIS — Z006 Encounter for examination for normal comparison and control in clinical research program: Secondary | ICD-10-CM | POA: Diagnosis not present

## 2016-07-12 DIAGNOSIS — I5022 Chronic systolic (congestive) heart failure: Secondary | ICD-10-CM | POA: Diagnosis not present

## 2016-07-12 DIAGNOSIS — Z7982 Long term (current) use of aspirin: Secondary | ICD-10-CM | POA: Diagnosis not present

## 2016-07-12 DIAGNOSIS — K219 Gastro-esophageal reflux disease without esophagitis: Secondary | ICD-10-CM | POA: Diagnosis not present

## 2016-07-12 DIAGNOSIS — I517 Cardiomegaly: Secondary | ICD-10-CM | POA: Diagnosis not present

## 2016-07-12 DIAGNOSIS — E785 Hyperlipidemia, unspecified: Secondary | ICD-10-CM | POA: Diagnosis not present

## 2016-07-12 DIAGNOSIS — I2510 Atherosclerotic heart disease of native coronary artery without angina pectoris: Secondary | ICD-10-CM | POA: Diagnosis not present

## 2016-07-12 DIAGNOSIS — I447 Left bundle-branch block, unspecified: Secondary | ICD-10-CM | POA: Diagnosis not present

## 2016-07-12 DIAGNOSIS — I255 Ischemic cardiomyopathy: Secondary | ICD-10-CM | POA: Diagnosis not present

## 2016-07-12 MED ORDER — ACETAMINOPHEN 325 MG PO TABS: 325.0000 mg | ORAL_TABLET | ORAL | Status: DC | PRN

## 2016-07-12 MED ORDER — RANITIDINE HCL 150 MG PO TABS: 150.0000 mg | ORAL_TABLET | Freq: Every day | ORAL | Status: DC

## 2016-07-12 NOTE — Progress Notes (Signed)
Pt is alert and oriented leg are has circulation sensation, ICD site dry and intact, swelling last night with pressure dressing and bruising. Poss d/c

## 2016-07-12 NOTE — Progress Notes (Signed)
Pt is alert oriented with wife at beside, IV out, Discharge instruction given, transported in wheel to [employer]

## 2016-07-12 NOTE — Progress Notes (Signed)
ICD placement site observed throughout the night and has noticeably less swelling this morning.  Will continue to monitor site.

## 2016-07-12 NOTE — Discharge Instructions (Signed)
° ° °  Supplemental Discharge Instructions for  Pacemaker/Defibrillator Patients  Activity No heavy lifting or vigorous activity with your left/right arm for 6 to 8 weeks.  Do not raise your left/right arm above your head for one week.  Gradually raise your affected arm as drawn below.           __          07/15/16                        07/16/16                      07/17/16                   07/18/16  NO DRIVING for 1 week    ; you may begin driving on   3/33/54  .  WOUND CARE - Keep the wound area clean and dry.  Do not get this area wet for one week. No showers for one week; you may shower on  07/18/16   . - The tape/steri-strips on your wound will fall off; do not pull them off.  No bandage is needed on the site.  DO  NOT apply any creams, oils, or ointments to the wound area. - If you notice any drainage or discharge from the wound, any swelling or bruising at the site, or you develop a fever > 101? F after you are discharged home, call the office at once.  Special Instructions - You are still able to use cellular telephones; use the ear opposite the side where you have your pacemaker/defibrillator.  Avoid carrying your cellular phone near your device. - When traveling through airports, show security personnel your identification card to avoid being screened in the metal detectors.  Ask the security personnel to use the hand wand. - Avoid arc welding equipment, MRI testing (magnetic resonance imaging), TENS units (transcutaneous nerve stimulators).  Call the office for questions about other devices. - Avoid electrical appliances that are in poor condition or are not properly grounded. - Microwave ovens are safe to be near or to operate.  Additional information for defibrillator patients should your device go off: - If your device goes off ONCE and you feel fine afterward, notify the device clinic nurses. - If your device goes off ONCE and you do not feel well afterward, call 911. - If your  device goes off TWICE, call 911. - If your device goes off THREE times in one day, call 911.  DO NOT DRIVE YOURSELF OR A FAMILY MEMBER WITH A DEFIBRILLATOR TO THE HOSPITAL--CALL 911.  Do Not Take Asprin and Plavix until see in the office for wound check.   Heart Healthy diet   IT would also be best to only use tylenol for pain, Ibuprofen can increase bleeding as well.  At least until follow up site check.

## 2016-07-13 ENCOUNTER — Encounter (HOSPITAL_COMMUNITY): Payer: Self-pay | Admitting: Internal Medicine

## 2016-07-21 ENCOUNTER — Telehealth: Payer: Self-pay | Admitting: Internal Medicine

## 2016-07-21 NOTE — Telephone Encounter (Signed)
Called, spoke with pt. Pt stated his ICD site still looks swollen. Pt stated he is does not have much fat, maybe it is just the device. Pt stated site is not red, draining and fluid, or painful. Pt is scheduled for wound check on 07/24/16. Informed I will forward to [facility] to advise.

## 2016-07-21 NOTE — Telephone Encounter (Signed)
Spoke to patient about device site. Patient states that the site is the same size that it was in the hospital. He said that he wasn't sure how long it would take for it to go down. I explained to patient that it normally takes weeks before there is an obvious reduction in size. I told patient to monitor the site for any increased edema, tightness, redness, or drainage. I told him that if he experiences any of those sx's then he would need to go to the ER. Patient verbalized understanding and appreciation of information.

## 2016-07-21 NOTE — Telephone Encounter (Signed)
New message    Pt requests that Renea call him back. States it is confidential.

## 2016-07-24 ENCOUNTER — Ambulatory Visit (INDEPENDENT_AMBULATORY_CARE_PROVIDER_SITE_OTHER): Payer: Medicare Other | Admitting: *Deleted

## 2016-07-24 DIAGNOSIS — I447 Left bundle-branch block, unspecified: Secondary | ICD-10-CM | POA: Diagnosis not present

## 2016-07-24 DIAGNOSIS — I5022 Chronic systolic (congestive) heart failure: Secondary | ICD-10-CM

## 2016-07-24 LAB — CUP PACEART INCLINIC DEVICE CHECK
Date Time Interrogation Session: 20180716040000
HIGH POWER IMPEDANCE MEASURED VALUE: 63 Ohm
Implantable Lead Implant Date: 20180703
Implantable Lead Implant Date: 20180703
Implantable Lead Location: 753859
Implantable Lead Location: 753860
Implantable Lead Model: 293
Implantable Lead Model: 4674
Implantable Lead Serial Number: 801469
Implantable Lead Serial Number: 901025
Implantable Pulse Generator Implant Date: 20180703
Lead Channel Impedance Value: 499 Ohm
Lead Channel Impedance Value: 628 Ohm
Lead Channel Pacing Threshold Amplitude: 0.7 V
Lead Channel Pacing Threshold Amplitude: 0.9 V
Lead Channel Pacing Threshold Pulse Width: 0.4 ms
Lead Channel Pacing Threshold Pulse Width: 0.4 ms
Lead Channel Sensing Intrinsic Amplitude: 25 mV
Lead Channel Setting Pacing Amplitude: 3.5 V
Lead Channel Setting Pacing Amplitude: 3.5 V
Lead Channel Setting Pacing Pulse Width: 0.4 ms
Lead Channel Setting Sensing Sensitivity: 0.6 mV
MDC IDC LEAD IMPLANT DT: 20180703
MDC IDC LEAD LOCATION: 753858
MDC IDC LEAD SERIAL: 433305
MDC IDC MSMT LEADCHNL LV PACING THRESHOLD AMPLITUDE: 0.8 V
MDC IDC MSMT LEADCHNL LV PACING THRESHOLD PULSEWIDTH: 0.4 ms
MDC IDC MSMT LEADCHNL RA IMPEDANCE VALUE: 583 Ohm
MDC IDC MSMT LEADCHNL RA SENSING INTR AMPL: 8.1 mV
MDC IDC MSMT LEADCHNL RV SENSING INTR AMPL: 25 mV — AB
MDC IDC SET LEADCHNL LV SENSING SENSITIVITY: 1 mV
MDC IDC SET LEADCHNL RA PACING AMPLITUDE: 3.5 V
MDC IDC SET LEADCHNL RV PACING PULSEWIDTH: 0.4 ms
MDC IDC STAT BRADY RA PERCENT PACED: 1 % — AB
MDC IDC STAT BRADY RV PERCENT PACED: 7 %
Pulse Gen Serial Number: 169965

## 2016-07-24 NOTE — Progress Notes (Signed)
Wound check appointment. Steri-strips removed. Incision edges approximated without drainage. Large hematoma noted, edges marked with black marker. Patient to take pictures daily for progress. No recommendations from Dr. Lovena Le other than continuing to hold ASA/Plavix. Normal device function. Thresholds, sensing, and impedances consistent with implant measurements. Device programmed at 3.5V for extra safety margin until 3 month visit. Histogram distribution appropriate for patient and level of activity. No mode switches or ventricular arrhythmias noted. Patient educated about wound care, arm mobility, lifting restrictions, shock plan. ROV with Device Clinic next Tuesday (24th) for wound re-check with GT in office. ROV with GT 10/27/16.

## 2016-08-01 ENCOUNTER — Ambulatory Visit (INDEPENDENT_AMBULATORY_CARE_PROVIDER_SITE_OTHER): Payer: Self-pay | Admitting: *Deleted

## 2016-08-01 DIAGNOSIS — I447 Left bundle-branch block, unspecified: Secondary | ICD-10-CM

## 2016-08-01 NOTE — Patient Instructions (Signed)
Hold Aspirin and Plavix for 2 weeks, call clinic at [phone] if device site becomes red or more swollen.

## 2016-08-01 NOTE — Progress Notes (Signed)
Device site assessed, hematoma noted marginally smaller, healing stages of bruising.  Per GT recommendations continue to hold ASA and Plavix for 2 weeks.

## 2016-08-21 ENCOUNTER — Encounter: Payer: Self-pay | Admitting: Interventional Cardiology

## 2016-08-22 ENCOUNTER — Telehealth: Payer: Self-pay | Admitting: *Deleted

## 2016-08-22 ENCOUNTER — Encounter: Payer: Self-pay | Admitting: *Deleted

## 2016-08-22 DIAGNOSIS — I10 Essential (primary) hypertension: Secondary | ICD-10-CM

## 2016-08-22 MED ORDER — LOSARTAN POTASSIUM 50 MG PO TABS: 50.0000 mg | ORAL_TABLET | Freq: Every day | ORAL | 3 refills | Status: DC

## 2016-08-22 NOTE — Telephone Encounter (Signed)
Prescription sent.  Sent message to pt to contact me back to set up labs since we are switching medications.

## 2016-08-22 NOTE — Telephone Encounter (Signed)
-----   Message from Belva Crome, MD sent at 08/22/2016 12:50 PM EDT ----- Anderson Malta please send a prescription for losartan 50 mg one daily to begin after cough and dizziness has resolved off lisinopril.  This is my response to the patient's my chart question this morning:  "Stop lisinopril. Allow headache and cough to resolve. Once these symptoms have resolved, we should start losartan 50 mg daily. Resume aspirin and Plavix today."  hs

## 2016-08-29 NOTE — Telephone Encounter (Signed)
Left message for pt to call back and schedule labs.

## 2016-08-31 ENCOUNTER — Telehealth: Payer: Self-pay | Admitting: Interventional Cardiology

## 2016-08-31 NOTE — Telephone Encounter (Signed)
Spoke with pt and scheduled him to come in for labs next week.  Pt appreciative for call.

## 2016-08-31 NOTE — Telephone Encounter (Signed)
New Message   Pt states he is returning a call from [place]

## 2016-09-05 ENCOUNTER — Other Ambulatory Visit: Payer: Medicare Other

## 2016-09-05 DIAGNOSIS — I10 Essential (primary) hypertension: Secondary | ICD-10-CM | POA: Diagnosis not present

## 2016-09-05 LAB — BASIC METABOLIC PANEL
BUN/Creatinine Ratio: 19 (ref 10–24)
BUN: 15 mg/dL (ref 8–27)
CALCIUM: 9.5 mg/dL (ref 8.6–10.2)
CHLORIDE: 103 mmol/L (ref 96–106)
CO2: 25 mmol/L (ref 20–29)
Creatinine, Ser: 0.77 mg/dL (ref 0.76–1.27)
GFR, EST AFRICAN AMERICAN: 109 mL/min/{1.73_m2} (ref >59)
GFR, EST NON AFRICAN AMERICAN: 95 mL/min/{1.73_m2} (ref >59)
Glucose: 114 mg/dL — ABNORMAL HIGH (ref 65–99)
Potassium: 4.7 mmol/L (ref 3.5–5.2)
Sodium: 141 mmol/L (ref 134–144)

## 2016-09-18 ENCOUNTER — Encounter: Payer: Self-pay | Admitting: Interventional Cardiology

## 2016-09-19 ENCOUNTER — Telehealth: Payer: Self-pay | Admitting: *Deleted

## 2016-09-19 NOTE — Telephone Encounter (Signed)
If having pain at rest/night and easily provokable with activity, needs to be set up for re-cath and stenting if appropriate.Schedule for next available date.

## 2016-09-19 NOTE — Telephone Encounter (Signed)
From: [name]  Sent: 09/18/2016  9:10 PM  To: [facility] T[facility]  Subject: Non-Urgent Medical Question             Having some angina in the last few days. Nitro glycerin helps. Do I need to schedule appt with you? Thanks. Rbt     Pt advice request received from pt.  Left message to call back

## 2016-09-19 NOTE — Telephone Encounter (Signed)
Spoke with pt and he states that the last two nights he has had an episode of angina that woke him up.  Took one nitro both nights and this was relieved.  States he does have some angina during the day but doesn't feel like it is bad enough to warrant any nitro.  No vitals available.  Scheduled pt to see Dr. Tamala Julian 09/21/16.  Advised when appropriate to head to ER for evaluation.  Pt verbalized understanding and was in agreement with this plan.

## 2016-09-21 ENCOUNTER — Encounter: Payer: Self-pay | Admitting: Interventional Cardiology

## 2016-09-21 ENCOUNTER — Ambulatory Visit (INDEPENDENT_AMBULATORY_CARE_PROVIDER_SITE_OTHER): Payer: Medicare Other | Admitting: Interventional Cardiology

## 2016-09-21 VITALS — BP 156/90 | HR 67 | Ht 72.0 in | Wt 182.8 lb

## 2016-09-21 DIAGNOSIS — I471 Supraventricular tachycardia: Secondary | ICD-10-CM | POA: Diagnosis not present

## 2016-09-21 DIAGNOSIS — I2510 Atherosclerotic heart disease of native coronary artery without angina pectoris: Secondary | ICD-10-CM

## 2016-09-21 DIAGNOSIS — I25709 Atherosclerosis of coronary artery bypass graft(s), unspecified, with unspecified angina pectoris: Secondary | ICD-10-CM | POA: Diagnosis not present

## 2016-09-21 DIAGNOSIS — I209 Angina pectoris, unspecified: Secondary | ICD-10-CM

## 2016-09-21 DIAGNOSIS — I5022 Chronic systolic (congestive) heart failure: Secondary | ICD-10-CM | POA: Diagnosis not present

## 2016-09-21 DIAGNOSIS — E7849 Other hyperlipidemia: Secondary | ICD-10-CM

## 2016-09-21 DIAGNOSIS — I10 Essential (primary) hypertension: Secondary | ICD-10-CM

## 2016-09-21 DIAGNOSIS — R0683 Snoring: Secondary | ICD-10-CM

## 2016-09-21 DIAGNOSIS — E784 Other hyperlipidemia: Secondary | ICD-10-CM | POA: Diagnosis not present

## 2016-09-21 MED ORDER — CARVEDILOL 12.5 MG PO TABS: 12.5000 mg | ORAL_TABLET | Freq: Two times a day (BID) | ORAL | 3 refills | Status: DC

## 2016-09-21 NOTE — Telephone Encounter (Signed)
Pt scheduled to see Dr. Tamala Julian today.

## 2016-09-21 NOTE — Progress Notes (Signed)
Cardiology Office Note    Date:  09/21/2016   ID:  Tommy [name], DOB 02-09-1950, MRN [id number]  PCP:  Patient, No Pcp Per  Cardiologist: Tommy Grooms, MD   Chief Complaint  Patient presents with  . Coronary Artery Disease  . Congestive Heart Failure  . Chest Pain    History of Present Illness:  Tommy [name] is a 66 y.o. male for follow-up of advanced coronary atherosclerotic heart disease with prior stent (2011/2012-RCA, SVG to D1, and OM) and bypass graft (2002) with free radial to RCA, LIMA to LAD, SVG to OM, and SVG to diagonal. Systolic heart failure identified 2017 with EF less than 40%, AICD implantation 2018, significant hyperlipidemia medically treated over the years, SVT, and hypertension.  Tommy [name] has been awakening with episodes of angina. It happened twice one week ago and on each occasion a sublingual nitroglycerin was used. No change in typical anginal pattern with activity and this time frame. No recurrent nocturnal episodes. Nitroglycerin use his been very rare although these occurrences represent an acceleration in symptoms. He denies progressive shortness of breath but no longer runs as C1 stare but does walk and jog.  Past Medical History:  Diagnosis Date  . Blood transfusion without reported diagnosis   . CAD (coronary artery disease)    with CABG LIMA to LAD, free radial PDA, SVG to diagonal, SVG to OM. 2002. DES May 2011 and 01-2010 to SVG of Diag, native OM, and Native RCA. Unable to stent LAD via LIMA  . GERD (gastroesophageal reflux disease)   . Hyperlipidemia    unable to tolarate statin therapy  . Kidney stone   . Vertigo     Past Surgical History:  Procedure Laterality Date  . BIV ICD INSERTION CRT-D N/A 07/11/2016   Procedure: BiV ICD Insertion CRT-D;  Surgeon: Tommy Lance, MD;  Location: [facility];  Service: Cardiovascular;  Laterality: N/A;  . CHOLECYSTECTOMY    . COLONOSCOPY    . CORONARY ARTERY BYPASS GRAFT    .  RIGHT/LEFT HEART CATH AND CORONARY/GRAFT ANGIOGRAPHY N/A 03/01/2016   Procedure: Right/Left Heart Cath and Coronary/Graft Angiography;  Surgeon: Tommy Crome, MD;  Location: [facility];  Service: Cardiovascular;  Laterality: N/A;    Current Medications: Outpatient Medications Prior to Visit  Medication Sig Dispense Refill  . acetaminophen (TYLENOL) 325 MG tablet Take 1-2 tablets (325-650 mg total) by mouth every 4 (four) hours as needed for mild pain.    Marland Kitchen atorvastatin (LIPITOR) 10 MG tablet TAKE ONE TABLET BY MOUTH EVERY DAY AT 6 PM. 90 tablet 3  . Cholecalciferol (VITAMIN D3) 5000 units CAPS Take 5,000 Units by mouth daily.    . finasteride (PROPECIA) 1 MG tablet Take 1 mg by mouth daily.  1  . losartan (COZAAR) 50 MG tablet Take 1 tablet (50 mg total) by mouth daily. 90 tablet 3  . niacinamide 500 MG tablet Take 500 mg by mouth daily.    . nitroGLYCERIN (NITROSTAT) 0.4 MG SL tablet Place 0.4 mg under the tongue every 5 (five) minutes as needed for chest pain (MAX 3 TABLETS).    Marland Kitchen oxymetazoline (AFRIN) 0.05 % nasal spray Place 1 spray into both nostrils at bedtime as needed for congestion.    . ranitidine (ZANTAC) 150 MG tablet Take 1 tablet (150 mg total) by mouth at bedtime. Only if needed.    . vitamin C (ASCORBIC ACID) 500 MG tablet Take 500 mg by mouth  daily.    . Zinc 50 MG TABS Take 50 mg by mouth daily.     . carvedilol (COREG) 6.25 MG tablet Take 1 tablet (6.25 mg total) by mouth 2 (two) times daily. 180 tablet 3  . omeprazole (PRILOSEC) 40 MG capsule Take 1 capsule (40 mg total) by mouth daily. Pt needs to contact office for further refills 30 capsule 11   No facility-administered medications prior to visit.      Allergies:   Fish oil   Social History   Social History  . Marital status: Married    Spouse name: N/A  . Number of children: N/A  . Years of education: N/A   Social History Main Topics  . Smoking status: Never Smoker  . Smokeless tobacco: Never Used    . Alcohol use 0.6 oz/week    1 Glasses of wine per week     Comment: prn  . Drug use: No  . Sexual activity: Not Asked   Other Topics Concern  . None   Social History Narrative  . None     Family History:  The patient's family history includes Colon cancer in his father.   ROS:   Please see the history of present illness.    Depression, snoring, difficulty sleeping, otherwise no complaints.  All other systems reviewed and are negative.   PHYSICAL EXAM:   VS:  BP (!) 156/90 (BP Location: Left Arm)   Pulse 67   Ht 6' (1.829 m)   Wt 182 lb 12.8 oz (82.9 kg)   BMI 24.79 kg/m    GEN: Well nourished, well developed, in no acute distress  HEENT: normal  Neck: no JVD, carotid bruits, or masses Cardiac: RRR; no murmurs, rubs, or gallops,no edema  Respiratory:  clear to auscultation bilaterally, normal work of breathing GI: soft, nontender, nondistended, + BS MS: no deformity or atrophy  Skin: warm and dry, no rash Neuro:  Alert and Oriented x 3, Strength and sensation are intact Psych: euthymic mood, full affect  Wt Readings from Last 3 Encounters:  09/21/16 182 lb 12.8 oz (82.9 kg)  07/12/16 182 lb 6.4 oz (82.7 kg)  07/06/16 182 lb 3.2 oz (82.6 kg)      Studies/Labs Reviewed:   EKG:  EKG  Atrial sensing with ventricular pacing. No change when compared to prior tracing done in July 2018.  Recent Labs: 02/09/2016: ALT 50 07/07/2016: Hemoglobin 13.5; Platelets 317 09/05/2016: BUN 15; Creatinine, Ser 0.77; Potassium 4.7; Sodium 141   Lipid Panel    Component Value Date/Time   CHOL 164 02/09/2016 1135   TRIG 162 (H) 02/09/2016 1135   HDL 45 02/09/2016 1135   CHOLHDL 3.6 02/09/2016 1135   CHOLHDL 3.3 03/03/2015 0744   VLDL 22 03/03/2015 0744   LDLCALC 87 02/09/2016 1135   LDLDIRECT 91.0 02/05/2014 0809    Additional studies/ records that were reviewed today include:  February 2018 coronary angiogram Coronary Diagrams   Diagnostic Diagram            ASSESSMENT:    1. Coronary artery disease involving coronary bypass graft of native heart with angina pectoris ([place])   2. HYPERTENSION, BENIGN SYSTEMIC   3. Ectopic atrial tachycardia ([place])   4. Chronic systolic heart failure ([facility])   5. Other hyperlipidemia   6. Snoring      PLAN:  In order of problems listed above:  1. Change in anginal pattern in conjunction with angiography from several months ago is concerning. Recommend increasing  carvedilol to 12.5 mg twice a day. Coronary angiography, bypass graft angiography, and PCI of bypass graft and native vessels that warrant therapy to help control angina (as soon as possible). Use nitroglycerin liberally for episodes of chest discomfort. He has not upcoming trip to [place] and will like to perform the procedure after he returns. This is okay as long as anginal symptoms remain stable is a have been over the past week. 2. Blood pressure is elevated. Carvedilol is increased to 12.5 mg twice a day 3. Not addressed 4. Increased beta blocker intensity may help improve/preserved LV function. 5. Target less than LDL of 70. 6. Needs a sleep study to exclude the possibility that sleep apnea is responsible for the episodes of angina that occurred last week awakening him from sleep.  Increased beta blocker protection. Schedule repeat cath with PCI or possible. We discussed PCI in the setting of no back up surgery. He has been seen by Dr. Tharon Aquas Trigt and because of previous vein graft harvesting, left radial harvesting, and internal mammary use, is not felt to have conduits for repeat surgery. Encouraged use nitroglycerin liberally. Any prolonged episodes of pain and relieved by nitroglycerin should prompt emergency room visit.  Medication Adjustments/Labs and Tests Ordered: Current medicines are reviewed at length with the patient today.  Concerns regarding medicines are outlined above.  Medication changes, Labs and Tests ordered today are  listed in the Patient Instructions below. Patient Instructions  Medication Instructions:  1) INCREASE Carvedilol to 12.5mg  twice daily  Labwork: None  Testing/Procedures: Your physician has requested that you have a cardiac catheterization. Cardiac catheterization is used to diagnose and/or treat various heart conditions. Doctors may recommend this procedure for a number of different reasons. The most common reason is to evaluate chest pain. Chest pain can be a symptom of coronary artery disease (CAD), and cardiac catheterization can show whether plaque is narrowing or blocking your heart's arteries. This procedure is also used to evaluate the valves, as well as measure the blood flow and oxygen levels in different parts of your heart. For further information please visit HugeFiesta.[email or web address]. Please follow instruction sheet, as given.   Follow-Up: Your physician recommends that you schedule a follow-up appointment  2-3 weeks after catheterization.    Any Other Special Instructions Will Be Listed Below (If Applicable).    [facility] OFFICE [address] [place] [address] Dept: [phone] Loc: [place]  09/21/2016  You are scheduled for a Cardiac Catheterization on Thursday, October 4 with Dr. Daneen Schick.  1. Please arrive at the [facility] (Main Entrance A) at [facility]: [place], [place] [address] at 8:30 AM (two hours before your procedure to ensure your preparation). Free valet parking service is available.   Special note: Every effort is made to have your procedure done on time. Please understand that emergencies sometimes delay scheduled procedures.  2. Diet: Do not eat or drink anything after midnight prior to your procedure except sips of water to take medications.  3. Labs: Your labs will be performed at the hospital after you arrive  for your procedure.  4. Medication instructions in preparation for your procedure:  You may take all of your medications as usual.   On the morning of your procedure, take your Aspirin and any morning medicines NOT listed above.  You may use sips of water.  5. Plan for one  night stay--bring personal belongings. 6. Bring a current list of your medications and current insurance cards. 7. You MUST have a responsible person to drive you home. 8. Someone MUST be with you the first 24 hours after you arrive home or your discharge will be delayed. 9. Please wear clothes that are easy to get on and off and wear slip-on shoes.  Thank you for allowing [place] to care for you!   --  Invasive Cardiovascular services    If you need a refill on your cardiac medications before your next appointment, please call your pharmacy.      Signed, Tommy Grooms, MD  09/21/2016 12:29 PM    [address], [place], [place]  [address] Phone: [phone]; Fax: [phone]

## 2016-09-21 NOTE — Patient Instructions (Signed)
Medication Instructions:  1) INCREASE Carvedilol to 12.5mg  twice daily  Labwork: None  Testing/Procedures: Your physician has requested that you have a cardiac catheterization. Cardiac catheterization is used to diagnose and/or treat various heart conditions. Doctors may recommend this procedure for a number of different reasons. The most common reason is to evaluate chest pain. Chest pain can be a symptom of coronary artery disease (CAD), and cardiac catheterization can show whether plaque is narrowing or blocking your heart's arteries. This procedure is also used to evaluate the valves, as well as measure the blood flow and oxygen levels in different parts of your heart. For further information please visit HugeFiesta.[email or web address]. Please follow instruction sheet, as given.   Follow-Up: Your physician recommends that you schedule a follow-up appointment  2-3 weeks after catheterization.    Any Other Special Instructions Will Be Listed Below (If Applicable).    [facility] OFFICE [address] [place] [address] Dept: [phone] Loc: [place]  09/21/2016  You are scheduled for a Cardiac Catheterization on Thursday, October 4 with Dr. Daneen Schick.  1. Please arrive at the [facility] (Main Entrance A) at [facility]: [place], [place] [address] at 8:30 AM (two hours before your procedure to ensure your preparation). Free valet parking service is available.   Special note: Every effort is made to have your procedure done on time. Please understand that emergencies sometimes delay scheduled procedures.  2. Diet: Do not eat or drink anything after midnight prior to your procedure except sips of water to take medications.  3. Labs: Your labs will be performed at the hospital after you arrive for your procedure.  4. Medication instructions in preparation  for your procedure:  You may take all of your medications as usual.   On the morning of your procedure, take your Aspirin and any morning medicines NOT listed above.  You may use sips of water.  5. Plan for one night stay--bring personal belongings. 6. Bring a current list of your medications and current insurance cards. 7. You MUST have a responsible person to drive you home. 8. Someone MUST be with you the first 24 hours after you arrive home or your discharge will be delayed. 9. Please wear clothes that are easy to get on and off and wear slip-on shoes.  Thank you for allowing [place] to care for you!   -- [place] Invasive Cardiovascular services    If you need a refill on your cardiac medications before your next appointment, please call your pharmacy.

## 2016-09-22 ENCOUNTER — Other Ambulatory Visit: Payer: Self-pay | Admitting: *Deleted

## 2016-09-22 ENCOUNTER — Telehealth: Payer: Self-pay | Admitting: *Deleted

## 2016-09-22 NOTE — Telephone Encounter (Signed)
Informed patient of upcoming sleep study and patient understanding was verbalized. Patient understands his sleep study is scheduled for Tuesday October 31 2016. Patient understands his sleep study will be done at [facility] sleep lab. Patient understands he will receive a sleep packet in a week or so. Patient understands to call if he does not receive the sleep packet in a timely manner. Patient agrees with treatment and thanked me for call.

## 2016-09-22 NOTE — Telephone Encounter (Signed)
Per Dr Tamala Julian sleep study ordered on 09/21/16.

## 2016-10-02 ENCOUNTER — Telehealth: Payer: Self-pay | Admitting: Interventional Cardiology

## 2016-10-02 NOTE — Telephone Encounter (Signed)
Spoke with patient and he wanted to know if Dr Tamala Julian would be doing cardiac caths any other day next week besides 10/12/16 when he is scheduled. Advised patient that was the only day he would be in cath lab. Did offer another day with another provider. He will just keep as scheduled

## 2016-10-02 NOTE — Telephone Encounter (Signed)
Tommy [name] is wanting a nurse to give him a call . Thanks

## 2016-10-11 ENCOUNTER — Telehealth: Payer: Self-pay

## 2016-10-11 NOTE — Telephone Encounter (Signed)
Patient contacted pre-catheterization at [facility] scheduled for:  10/12/2016 @ 1030 Verified arrival time and place:  [facility] @ 0800 Confirmed AM meds to be taken pre-cath with sip of water: Take ASA/ Plavix Confirmed patient has responsible person to drive home post procedure and observe patient for 24 hours:  yes Addl concerns:  none

## 2016-10-12 ENCOUNTER — Encounter (HOSPITAL_COMMUNITY): Payer: Self-pay | Admitting: General Practice

## 2016-10-12 ENCOUNTER — Ambulatory Visit (HOSPITAL_COMMUNITY)
Admission: RE | Disposition: A | Discharge status: Home / Self Care | Payer: Self-pay | Source: Ambulatory Visit | Attending: Interventional Cardiology

## 2016-10-12 ENCOUNTER — Ambulatory Visit (HOSPITAL_COMMUNITY)
Admission: RE | Admit: 2016-10-12 | Discharge: 2016-10-13 | Disposition: A | Discharge status: Home / Self Care | Payer: Medicare Other | Source: Ambulatory Visit | Attending: Interventional Cardiology | Admitting: Interventional Cardiology

## 2016-10-12 DIAGNOSIS — Z955 Presence of coronary angioplasty implant and graft: Secondary | ICD-10-CM

## 2016-10-12 DIAGNOSIS — I5022 Chronic systolic (congestive) heart failure: Secondary | ICD-10-CM | POA: Insufficient documentation

## 2016-10-12 DIAGNOSIS — Z79899 Other long term (current) drug therapy: Secondary | ICD-10-CM | POA: Insufficient documentation

## 2016-10-12 DIAGNOSIS — I25118 Atherosclerotic heart disease of native coronary artery with other forms of angina pectoris: Secondary | ICD-10-CM | POA: Diagnosis not present

## 2016-10-12 DIAGNOSIS — I110 Hypertensive heart disease with heart failure: Secondary | ICD-10-CM | POA: Diagnosis not present

## 2016-10-12 DIAGNOSIS — I255 Ischemic cardiomyopathy: Secondary | ICD-10-CM | POA: Diagnosis not present

## 2016-10-12 DIAGNOSIS — Y838 Other surgical procedures as the cause of abnormal reaction of the patient, or of later complication, without mention of misadventure at the time of the procedure: Secondary | ICD-10-CM | POA: Insufficient documentation

## 2016-10-12 DIAGNOSIS — T82858A Stenosis of vascular prosthetic devices, implants and grafts, initial encounter: Secondary | ICD-10-CM | POA: Insufficient documentation

## 2016-10-12 DIAGNOSIS — I25709 Atherosclerosis of coronary artery bypass graft(s), unspecified, with unspecified angina pectoris: Secondary | ICD-10-CM | POA: Diagnosis not present

## 2016-10-12 DIAGNOSIS — E785 Hyperlipidemia, unspecified: Secondary | ICD-10-CM | POA: Insufficient documentation

## 2016-10-12 DIAGNOSIS — I25708 Atherosclerosis of coronary artery bypass graft(s), unspecified, with other forms of angina pectoris: Secondary | ICD-10-CM | POA: Diagnosis not present

## 2016-10-12 DIAGNOSIS — Z7902 Long term (current) use of antithrombotics/antiplatelets: Secondary | ICD-10-CM | POA: Diagnosis not present

## 2016-10-12 DIAGNOSIS — I25810 Atherosclerosis of coronary artery bypass graft(s) without angina pectoris: Secondary | ICD-10-CM | POA: Diagnosis present

## 2016-10-12 DIAGNOSIS — Z7982 Long term (current) use of aspirin: Secondary | ICD-10-CM | POA: Diagnosis not present

## 2016-10-12 HISTORY — DX: Essential (primary) hypertension: I10

## 2016-10-12 HISTORY — PX: CORONARY STENT INTERVENTION: CATH118234

## 2016-10-12 HISTORY — DX: Angina pectoris, unspecified: I20.9

## 2016-10-12 HISTORY — DX: Presence of automatic (implantable) cardiac defibrillator: Z95.810

## 2016-10-12 HISTORY — PX: LEFT HEART CATH AND CORS/GRAFTS ANGIOGRAPHY: CATH118250

## 2016-10-12 HISTORY — PX: CORONARY BALLOON ANGIOPLASTY: CATH118233

## 2016-10-12 LAB — BASIC METABOLIC PANEL
Anion gap: 9 (ref 5–15)
BUN: 17 mg/dL (ref 6–20)
CALCIUM: 9.7 mg/dL (ref 8.9–10.3)
CHLORIDE: 103 mmol/L (ref 101–111)
CO2: 25 mmol/L (ref 22–32)
CREATININE: 1.01 mg/dL (ref 0.61–1.24)
GFR calc Af Amer: >60 mL/min (ref >60)
GFR calc non Af Amer: >60 mL/min (ref >60)
GLUCOSE: 106 mg/dL — AB (ref 65–99)
Potassium: 4.1 mmol/L (ref 3.5–5.1)
Sodium: 137 mmol/L (ref 135–145)

## 2016-10-12 LAB — CBC
HEMATOCRIT: 45.3 % (ref 39.0–52.0)
HEMOGLOBIN: 15.2 g/dL (ref 13.0–17.0)
MCH: 29.5 pg (ref 26.0–34.0)
MCHC: 33.6 g/dL (ref 30.0–36.0)
MCV: 88 fL (ref 78.0–100.0)
Platelets: 298 10*3/uL (ref 150–400)
RBC: 5.15 MIL/uL (ref 4.22–5.81)
RDW: 12.3 % (ref 11.5–15.5)
WBC: 7.9 10*3/uL (ref 4.0–10.5)

## 2016-10-12 LAB — PROTIME-INR
INR: 0.93
Prothrombin Time: 12.3 seconds (ref 11.4–15.2)

## 2016-10-12 LAB — POCT ACTIVATED CLOTTING TIME: ACTIVATED CLOTTING TIME: 356 s

## 2016-10-12 SURGERY — LEFT HEART CATH AND CORS/GRAFTS ANGIOGRAPHY
Anesthesia: LOCAL

## 2016-10-12 MED ORDER — IOPAMIDOL (ISOVUE-370) INJECTION 76%
INTRAVENOUS | Status: AC
  Filled 2016-10-12: qty 100

## 2016-10-12 MED ORDER — LIDOCAINE HCL 2 % IJ SOLN
INTRAMUSCULAR | Status: AC
  Filled 2016-10-12: qty 10

## 2016-10-12 MED ORDER — SODIUM CHLORIDE 0.9% FLUSH
3.0000 mL | Freq: Two times a day (BID) | INTRAVENOUS | Status: DC
  Administered 2016-10-12: 3 mL via INTRAVENOUS

## 2016-10-12 MED ORDER — HEPARIN (PORCINE) IN NACL 2-0.9 UNIT/ML-% IJ SOLN
INTRAMUSCULAR | Status: AC
  Filled 2016-10-12: qty 1000

## 2016-10-12 MED ORDER — ASPIRIN 81 MG PO CHEW: 81.0000 mg | CHEWABLE_TABLET | Freq: Every day | ORAL | Status: DC

## 2016-10-12 MED ORDER — HEPARIN (PORCINE) IN NACL 2-0.9 UNIT/ML-% IJ SOLN
INTRAMUSCULAR | Status: AC | PRN
  Administered 2016-10-12: 1000 mL via INTRA_ARTERIAL
  Administered 2016-10-12: 500 mL via INTRA_ARTERIAL

## 2016-10-12 MED ORDER — SODIUM CHLORIDE 0.9 % WEIGHT BASED INFUSION
1.0000 mL/kg/h | INTRAVENOUS | Status: AC
  Administered 2016-10-12: 1 mL/kg/h via INTRAVENOUS

## 2016-10-12 MED ORDER — LABETALOL HCL 5 MG/ML IV SOLN: 10.0000 mg | INTRAVENOUS | Status: AC | PRN

## 2016-10-12 MED ORDER — IOPAMIDOL (ISOVUE-370) INJECTION 76%
INTRAVENOUS | Status: DC | PRN
  Administered 2016-10-12: 220 mL via INTRA_ARTERIAL

## 2016-10-12 MED ORDER — ACETAMINOPHEN 325 MG PO TABS
325.0000 mg | ORAL_TABLET | ORAL | Status: DC | PRN
  Administered 2016-10-12: 650 mg via ORAL
  Filled 2016-10-12: qty 2

## 2016-10-12 MED ORDER — BIVALIRUDIN TRIFLUOROACETATE 250 MG IV SOLR
INTRAVENOUS | Status: AC
  Filled 2016-10-12: qty 250

## 2016-10-12 MED ORDER — PANTOPRAZOLE SODIUM 40 MG PO TBEC
40.0000 mg | DELAYED_RELEASE_TABLET | Freq: Every day | ORAL | Status: DC
  Administered 2016-10-12 – 2016-10-13 (×2): 40 mg via ORAL
  Filled 2016-10-12 (×2): qty 1

## 2016-10-12 MED ORDER — ONDANSETRON HCL 4 MG/2ML IJ SOLN: 4.0000 mg | Freq: Four times a day (QID) | INTRAMUSCULAR | Status: DC | PRN

## 2016-10-12 MED ORDER — ASPIRIN 81 MG PO CHEW: 81.0000 mg | CHEWABLE_TABLET | ORAL | Status: DC

## 2016-10-12 MED ORDER — MIDAZOLAM HCL 2 MG/2ML IJ SOLN
INTRAMUSCULAR | Status: AC
  Filled 2016-10-12: qty 2

## 2016-10-12 MED ORDER — MIDAZOLAM HCL 2 MG/2ML IJ SOLN
INTRAMUSCULAR | Status: DC | PRN
  Administered 2016-10-12 (×4): 1 mg via INTRAVENOUS

## 2016-10-12 MED ORDER — FENTANYL CITRATE (PF) 100 MCG/2ML IJ SOLN
INTRAMUSCULAR | Status: DC | PRN
  Administered 2016-10-12 (×2): 50 ug via INTRAVENOUS

## 2016-10-12 MED ORDER — HEPARIN SODIUM (PORCINE) 5000 UNIT/ML IJ SOLN
5000.0000 [IU] | Freq: Three times a day (TID) | INTRAMUSCULAR | Status: DC
  Administered 2016-10-13: 5000 [IU] via SUBCUTANEOUS
  Filled 2016-10-12: qty 1

## 2016-10-12 MED ORDER — OXYCODONE HCL 5 MG PO TABS: 5.0000 mg | ORAL_TABLET | ORAL | Status: DC | PRN

## 2016-10-12 MED ORDER — HYDRALAZINE HCL 20 MG/ML IJ SOLN: 5.0000 mg | INTRAMUSCULAR | Status: AC | PRN

## 2016-10-12 MED ORDER — FAMOTIDINE 20 MG PO TABS: 20.0000 mg | ORAL_TABLET | Freq: Every evening | ORAL | Status: DC | PRN

## 2016-10-12 MED ORDER — ASPIRIN EC 81 MG PO TBEC
81.0000 mg | DELAYED_RELEASE_TABLET | Freq: Every day | ORAL | Status: DC
  Administered 2016-10-13: 81 mg via ORAL
  Filled 2016-10-12: qty 1

## 2016-10-12 MED ORDER — BIVALIRUDIN TRIFLUOROACETATE 250 MG IV SOLR
INTRAVENOUS | Status: DC | PRN
  Administered 2016-10-12 (×2): 1.75 mg/kg/h via INTRAVENOUS

## 2016-10-12 MED ORDER — CARVEDILOL 12.5 MG PO TABS
12.5000 mg | ORAL_TABLET | Freq: Two times a day (BID) | ORAL | Status: DC
  Administered 2016-10-12 – 2016-10-13 (×2): 12.5 mg via ORAL
  Filled 2016-10-12 (×2): qty 1

## 2016-10-12 MED ORDER — HEPARIN SODIUM (PORCINE) 1000 UNIT/ML IJ SOLN
INTRAMUSCULAR | Status: AC
  Filled 2016-10-12: qty 1

## 2016-10-12 MED ORDER — CLOPIDOGREL BISULFATE 75 MG PO TABS
75.0000 mg | ORAL_TABLET | Freq: Every day | ORAL | Status: DC
  Administered 2016-10-13: 75 mg via ORAL
  Filled 2016-10-12: qty 1

## 2016-10-12 MED ORDER — SODIUM CHLORIDE 0.9% FLUSH: 3.0000 mL | Freq: Two times a day (BID) | INTRAVENOUS | Status: DC

## 2016-10-12 MED ORDER — SODIUM CHLORIDE 0.9 % IV SOLN: 250.0000 mL | INTRAVENOUS | Status: DC | PRN

## 2016-10-12 MED ORDER — LOSARTAN POTASSIUM 50 MG PO TABS
50.0000 mg | ORAL_TABLET | Freq: Every day | ORAL | Status: DC
  Administered 2016-10-13: 50 mg via ORAL
  Filled 2016-10-12: qty 1

## 2016-10-12 MED ORDER — ACETAMINOPHEN 325 MG PO TABS: 650.0000 mg | ORAL_TABLET | ORAL | Status: DC | PRN

## 2016-10-12 MED ORDER — NITROGLYCERIN 1 MG/10 ML FOR IR/CATH LAB
INTRA_ARTERIAL | Status: DC | PRN
  Administered 2016-10-12 (×2): 200 ug via INTRACORONARY

## 2016-10-12 MED ORDER — VITAMIN D 1000 UNITS PO TABS
5000.0000 [IU] | ORAL_TABLET | Freq: Every day | ORAL | Status: DC
  Administered 2016-10-13: 5000 [IU] via ORAL
  Filled 2016-10-12: qty 5

## 2016-10-12 MED ORDER — BIVALIRUDIN BOLUS VIA INFUSION - CUPID
INTRAVENOUS | Status: DC | PRN
Start: 2016-10-12 — End: 2016-10-12
  Administered 2016-10-12: 69.75 mg via INTRAVENOUS

## 2016-10-12 MED ORDER — SODIUM CHLORIDE 0.9 % WEIGHT BASED INFUSION: 1.0000 mL/kg/h | INTRAVENOUS | Status: DC

## 2016-10-12 MED ORDER — NITROGLYCERIN 0.4 MG SL SUBL: 0.4000 mg | SUBLINGUAL_TABLET | SUBLINGUAL | Status: DC | PRN

## 2016-10-12 MED ORDER — CLOPIDOGREL BISULFATE 300 MG PO TABS
ORAL_TABLET | ORAL | Status: DC | PRN
  Administered 2016-10-12: 300 mg via ORAL

## 2016-10-12 MED ORDER — SODIUM CHLORIDE 0.9% FLUSH: 3.0000 mL | INTRAVENOUS | Status: DC | PRN

## 2016-10-12 MED ORDER — CLOPIDOGREL BISULFATE 300 MG PO TABS
ORAL_TABLET | ORAL | Status: AC
  Filled 2016-10-12: qty 1

## 2016-10-12 MED ORDER — SODIUM CHLORIDE 0.9 % WEIGHT BASED INFUSION
3.0000 mL/kg/h | INTRAVENOUS | Status: DC
  Administered 2016-10-12: 3 mL/kg/h via INTRAVENOUS

## 2016-10-12 MED ORDER — ATORVASTATIN CALCIUM 80 MG PO TABS: 80.0000 mg | ORAL_TABLET | Freq: Every day | ORAL | Status: DC

## 2016-10-12 MED ORDER — ATORVASTATIN CALCIUM 80 MG PO TABS
80.0000 mg | ORAL_TABLET | Freq: Every day | ORAL | Status: DC
  Administered 2016-10-12: 80 mg via ORAL
  Filled 2016-10-12: qty 1

## 2016-10-12 MED ORDER — CLOPIDOGREL BISULFATE 75 MG PO TABS: 75.0000 mg | ORAL_TABLET | Freq: Once | ORAL | Status: DC

## 2016-10-12 MED ORDER — CLOPIDOGREL BISULFATE 75 MG PO TABS: 75.0000 mg | ORAL_TABLET | Freq: Every day | ORAL | Status: DC

## 2016-10-12 MED ORDER — FENTANYL CITRATE (PF) 100 MCG/2ML IJ SOLN
INTRAMUSCULAR | Status: AC
  Filled 2016-10-12: qty 2

## 2016-10-12 MED ORDER — LIDOCAINE HCL (PF) 1 % IJ SOLN
INTRAMUSCULAR | Status: DC | PRN
  Administered 2016-10-12: 15 mL

## 2016-10-12 SURGICAL SUPPLY — 27 items
BALLN SAPPHIRE ~~LOC~~ 2.5X12 (BALLOONS) ×1 IMPLANT
BALLN SAPPHIRE ~~LOC~~ 3.75X15 (BALLOONS) ×1 IMPLANT
BALLN SAPPHIRE ~~LOC~~ 4.0X12 (BALLOONS) ×1 IMPLANT
BALLN WOLVERINE 2.75X6 (BALLOONS) ×2
BALLN WOLVERINE 3.00X10 (BALLOONS) ×2
BALLOON WOLVERINE 2.75X6 (BALLOONS) IMPLANT
BALLOON WOLVERINE 3.00X10 (BALLOONS) IMPLANT
CATH INFINITI 5FR MPB2 (CATHETERS) ×1 IMPLANT
CATH INFINITI JR4 5F (CATHETERS) ×1 IMPLANT
CATH LAUNCHER 6FR AL1 (CATHETERS) IMPLANT
CATH VISTA GUIDE 6FR XB3.5 (CATHETERS) ×2 IMPLANT
CATH VISTA GUIDE 6FR XBRCA (CATHETERS) ×1 IMPLANT
CATHETER LAUNCHER 6FR AL1 (CATHETERS) ×2
COVER PRB 48X5XTLSCP FOLD TPE (BAG) IMPLANT
COVER PROBE 5X48 (BAG) ×2
DEVICE WIRE ANGIOSEAL 6FR (Vascular Products) ×1 IMPLANT
FILTERWIRE EZ 2.25-3.5 190CM (WIRE) ×1 IMPLANT
KIT ENCORE 26 ADVANTAGE (KITS) ×1 IMPLANT
KIT HEART LEFT (KITS) ×2 IMPLANT
PACK CARDIAC CATHETERIZATION (CUSTOM PROCEDURE TRAY) ×2 IMPLANT
SHEATH PINNACLE 6F 10CM (SHEATH) ×1 IMPLANT
STENT RESOLUTE ONYX 2.5X12 (Permanent Stent) ×1 IMPLANT
STENT RESOLUTE ONYX 3.5X18 (Permanent Stent) ×1 IMPLANT
STENT RESOLUTE ONYX 3.5X26 (Permanent Stent) ×1 IMPLANT
TRANSDUCER W/STOPCOCK (MISCELLANEOUS) ×2 IMPLANT
TUBING CIL FLEX 10 FLL-RA (TUBING) ×2 IMPLANT
WIRE ASAHI PROWATER 180CM (WIRE) ×3 IMPLANT

## 2016-10-12 NOTE — Care Management Note (Signed)
Case Management Note  Patient Details  Name: SAIVON PROWSE MRN: [id number] Date of Birth: 1950-01-15  Subjective/Objective:   From home, s/p coronary stent intervention,will be on plavix.                 Action/Plan: NCM will follow for dc needs.   Expected Discharge Date:                  Expected Discharge Plan:  Home/Self Care  In-House Referral:     Discharge planning Services  CM Consult  Post Acute Care Choice:    Choice offered to:     DME Arranged:    DME Agency:     HH Arranged:    [place] Agency:     Status of Service:  Completed, signed off  If discussed at [employer] of Stay Meetings, dates discussed:    Additional Comments:  Zenon Mayo, RN 10/12/2016, 4:40 PM

## 2016-10-13 ENCOUNTER — Encounter (HOSPITAL_COMMUNITY): Payer: Self-pay | Admitting: Interventional Cardiology

## 2016-10-13 ENCOUNTER — Other Ambulatory Visit: Payer: Self-pay | Admitting: Interventional Cardiology

## 2016-10-13 DIAGNOSIS — I10 Essential (primary) hypertension: Secondary | ICD-10-CM | POA: Diagnosis not present

## 2016-10-13 DIAGNOSIS — I255 Ischemic cardiomyopathy: Secondary | ICD-10-CM | POA: Diagnosis not present

## 2016-10-13 DIAGNOSIS — I25708 Atherosclerosis of coronary artery bypass graft(s), unspecified, with other forms of angina pectoris: Secondary | ICD-10-CM | POA: Diagnosis not present

## 2016-10-13 DIAGNOSIS — E782 Mixed hyperlipidemia: Secondary | ICD-10-CM | POA: Diagnosis not present

## 2016-10-13 DIAGNOSIS — T82858A Stenosis of vascular prosthetic devices, implants and grafts, initial encounter: Secondary | ICD-10-CM | POA: Diagnosis not present

## 2016-10-13 DIAGNOSIS — E785 Hyperlipidemia, unspecified: Secondary | ICD-10-CM | POA: Diagnosis not present

## 2016-10-13 DIAGNOSIS — Z955 Presence of coronary angioplasty implant and graft: Secondary | ICD-10-CM | POA: Diagnosis not present

## 2016-10-13 DIAGNOSIS — Z79899 Other long term (current) drug therapy: Secondary | ICD-10-CM | POA: Diagnosis not present

## 2016-10-13 DIAGNOSIS — I5022 Chronic systolic (congestive) heart failure: Secondary | ICD-10-CM | POA: Diagnosis not present

## 2016-10-13 DIAGNOSIS — Z7902 Long term (current) use of antithrombotics/antiplatelets: Secondary | ICD-10-CM | POA: Diagnosis not present

## 2016-10-13 DIAGNOSIS — I110 Hypertensive heart disease with heart failure: Secondary | ICD-10-CM | POA: Diagnosis not present

## 2016-10-13 DIAGNOSIS — I25709 Atherosclerosis of coronary artery bypass graft(s), unspecified, with unspecified angina pectoris: Secondary | ICD-10-CM | POA: Diagnosis not present

## 2016-10-13 DIAGNOSIS — Z7982 Long term (current) use of aspirin: Secondary | ICD-10-CM | POA: Diagnosis not present

## 2016-10-13 LAB — CBC
HCT: 39.3 % (ref 39.0–52.0)
Hemoglobin: 13 g/dL (ref 13.0–17.0)
MCH: 29.1 pg (ref 26.0–34.0)
MCHC: 33.1 g/dL (ref 30.0–36.0)
MCV: 87.9 fL (ref 78.0–100.0)
Platelets: 249 10*3/uL (ref 150–400)
RBC: 4.47 MIL/uL (ref 4.22–5.81)
RDW: 12.1 % (ref 11.5–15.5)
WBC: 7.9 10*3/uL (ref 4.0–10.5)

## 2016-10-13 LAB — BASIC METABOLIC PANEL
ANION GAP: 8 (ref 5–15)
BUN: 16 mg/dL (ref 6–20)
CALCIUM: 8.8 mg/dL — AB (ref 8.9–10.3)
CO2: 26 mmol/L (ref 22–32)
Chloride: 103 mmol/L (ref 101–111)
Creatinine, Ser: 0.88 mg/dL (ref 0.61–1.24)
GFR calc non Af Amer: >60 mL/min (ref >60)
Glucose, Bld: 106 mg/dL — ABNORMAL HIGH (ref 65–99)
POTASSIUM: 4 mmol/L (ref 3.5–5.1)
Sodium: 137 mmol/L (ref 135–145)

## 2016-10-13 MED ORDER — ATORVASTATIN CALCIUM 80 MG PO TABS: 80.0000 mg | ORAL_TABLET | Freq: Every day | ORAL | 1 refills | Status: DC

## 2016-10-13 MED ORDER — PANTOPRAZOLE SODIUM 40 MG PO TBEC: 40.0000 mg | DELAYED_RELEASE_TABLET | Freq: Every day | ORAL | 2 refills | Status: DC

## 2016-10-13 MED ORDER — ANGIOPLASTY BOOK
Freq: Once | Status: AC
  Administered 2016-10-13: 11:00:00
  Filled 2016-10-13: qty 1

## 2016-10-13 MED FILL — Lidocaine HCl Local Inj 2%: INTRAMUSCULAR | Qty: 10 | Status: AC

## 2016-10-13 NOTE — Progress Notes (Signed)
CARDIAC REHAB PHASE I   PRE:  Rate/Rhythm: 80 paced    BP: sitting 143/83    SaO2:   MODE:  Ambulation: 800 ft   POST:  Rate/Rhythm: 83 pacing    BP: sitting 159/78     SaO2:   Tolerated well, no c/o. Ed reviewed. Pt very well versed in managing CAD and exercise (avid runner). Understands importance of Plavix. Will refer to [place] however he is not interested. He wears a HR monitor and exercises daily.  [place], ACSM 10/13/2016 8:42 AM

## 2016-10-13 NOTE — H&P (Signed)
The history and physical are unchanged when compared to the cardiology office note noted on 09/21/2016. He is undergoing multisite percutaneous coronary intervention today from the right femoral approach.  The patient was counseled to undergo left heart catheterization, coronary angiography, and possible percutaneous coronary intervention with stent implantation. The procedural risks and benefits were discussed in detail. The risks discussed included death, stroke, myocardial infarction, life-threatening bleeding, limb ischemia, kidney injury, allergy, and possible emergency cardiac surgery. The risk of these significant complications were estimated to occur less than 1% of the time. After discussion, the patient has agreed to proceed.

## 2016-10-13 NOTE — Discharge Summary (Signed)
Discharge Summary    Patient ID: Tommy [name],  MRN: [id number], DOB/AGE: 66-Aug-1952 66 y.o.  Admit date: 10/12/2016 Discharge date: 10/13/2016  Primary Care Provider: Patient, No Pcp Per Primary Cardiologist: Tamala Julian   Discharge Diagnoses    Principal Problem:   Coronary artery disease involving coronary bypass graft of native heart with angina pectoris [facility]) Active Problems:   CAD (coronary artery disease) of bypass graft   Allergies Allergies  Allergen Reactions  . Fish Oil Other (See Comments)    FEELS BAD     Diagnostic Studies/Procedures    Cath: 10/12/16  Conclusion    Complex multi lesion/site PCI completed successfully.  PTCA of focal in-stent restenosis in the second obtuse marginal reducing 90% stenosis to 0%.  Multisite stenting of the SVG to the diagonal with reduction in 99% stenosis to 0% in the proximal to mid body and reduction of distal anastomosis 95% stenosis to 0% using a 3.5 x 26 Onyx and 2.5 x 12 mm Onyx respectively.  Successful angioplasty of the mid RCA, previously stented reducing 60% stenosis to less than 50%.  Successful deployment of distal RCA stent beyond the previously stented segment reducing an 85% stenosis to 0% using a 3.5 x 18 Onyx postdilated to 4.0 mm.  Total contrast less than 250 cc.  Angio-Seal used with good hemostasis.  RECOMMENDATIONS:   Anticipate discharge in a.m.  I am concerned about the saphenous vein graft to the diagonal and for that reason may require repeat catheterization in approximately 3-6 months. The proximal/ostial portion of the graft may need to be stented.  Continue aspirin and Plavix.  Increased and therapy to high intensity.  Guideline directed therapy for left ventricular systolic dysfunction.   Diagnostic Diagram       Post-Intervention Diagram         _____________   History of Present Illness     Tommy [name] is a 66 y.o. male who was seen in the office by Dr. Tamala Julian  on 09/21/16 follow-up of advanced coronary atherosclerotic heart disease with prior stent (2011/2012-RCA, SVG to D1, and OM) and bypass graft (2002) with free radial to RCA, LIMA to LAD, SVG to OM, and SVG to diagonal. Systolic heart failure identified 2017 with EF less than 40%, AICD implantation 2018, significant hyperlipidemia medically treated over the years, SVT, and hypertension.  He had been awakening with episodes of angina. It happened twice one week prior to his office visit and on each occasion a sublingual nitroglycerin was used. No change in typical anginal pattern with activity and this time frame. No recurrent nocturnal episodes. Nitroglycerin use his been very rare although these occurrences represent an acceleration in symptoms. He denied progressive shortness of breath but no longer runs as often but does walk and jog. Given his symptoms his coreg was increased to 12.5mg  BID and referred for outpatient cath.   Hospital Course     Underwent cardiac cath noted above with Dr. Tamala Julian with multiple interventions. PTCA to ISR of the 2nd OM from 90%-0%, and multi-site stenting of the SVG-->diag with DESx2. Also successful angioplasty of the mRCA with reduction to 50%, along with PCI/DES to the dRCA. Angio-seal use for hemostasis. Plan for DAPT with ASA/plavix. There was concern regarding the SVG to diag that it may require intervention to the p/ostial portion in the next 3-6 months. Increased statin from Lipitor 10mg  daily to 80mg  daily, but patient reported that he has been unable to tolerate higher dose statins.  Consider referral to the lipid clinic as an outpatient. Continued on GDMT with BB, ARB. Post cath labs showed stable Cr 0.88, and Hgb 13.0. No complications noted post cath.   General: Well developed, well nourished, male appearing in no acute distress. Head: Normocephalic, atraumatic.  Neck: Supple without bruits, JVD. Lungs:  Resp regular and unlabored, CTA. Heart: RRR, S1, S2, no  S3, S4, or murmur; no rub. Abdomen: Soft, non-tender, non-distended with normoactive bowel sounds. No hepatomegaly. No rebound/guarding. No obvious abdominal masses. Extremities: No clubbing, cyanosis, edema. Distal pedal pulses are 2+ bilaterally. R groin cath site stable without bruising. Neuro: Alert and oriented X 3. Moves all extremities spontaneously. Psych: Normal affect.  [name] was seen by Dr. Irish Lack and determined stable for discharge home. Follow up in the office has been arranged. Medications are listed below.   _____________  Discharge Vitals Blood pressure (!) 143/83, pulse 78, temperature 98.4 F (36.9 C), temperature source Oral, resp. rate 20, height 6\' 3"  (1.905 m), weight 205 lb (93 kg), SpO2 98 %.  Filed Weights   10/12/16 0839 10/12/16 0916  Weight: 180 lb (81.6 kg) 205 lb (93 kg)    Labs & Radiologic Studies    CBC  Recent Labs  10/12/16 0848 10/13/16 0345  WBC 7.9 7.9  HGB 15.2 13.0  HCT 45.3 39.3  MCV 88.0 87.9  PLT 298 341   Basic Metabolic Panel  Recent Labs  10/12/16 0848 10/13/16 0345  NA 137 137  K 4.1 4.0  CL 103 103  CO2 25 26  GLUCOSE 106* 106*  BUN 17 16  CREATININE 1.01 0.88  CALCIUM 9.7 8.8*   Liver Function Tests No results for input(s): AST, ALT, ALKPHOS, BILITOT, PROT, ALBUMIN in the last 72 hours. No results for input(s): LIPASE, AMYLASE in the last 72 hours. Cardiac Enzymes No results for input(s): CKTOTAL, CKMB, CKMBINDEX, TROPONINI in the last 72 hours. BNP Invalid input(s): POCBNP D-Dimer No results for input(s): DDIMER in the last 72 hours. Hemoglobin A1C No results for input(s): HGBA1C in the last 72 hours. Fasting Lipid Panel No results for input(s): CHOL, HDL, LDLCALC, TRIG, CHOLHDL, LDLDIRECT in the last 72 hours. Thyroid Function Tests No results for input(s): TSH, T4TOTAL, T3FREE, THYROIDAB in the last 72 hours.  Invalid input(s): FREET3 _____________  No results found. Disposition   Pt is  being discharged home today in good condition.  Follow-up Plans & Appointments    Follow-up Information    Belva Crome, MD Follow up on 11/01/2016.   Specialty:  Cardiology Why:  at 10:20am for your follow up appt.  Contact information: [address] [address] [phone]          Discharge Instructions    (HEART FAILURE PATIENTS) Call MD:  Anytime you have any of the following symptoms: 1) 3 pound weight gain in 24 hours or 5 pounds in 1 week 2) shortness of breath, with or without a dry hacking cough 3) swelling in the hands, feet or stomach 4) if you have to sleep on extra pillows at night in order to breathe.    Complete by:  As directed    Amb Referral to Cardiac Rehabilitation    Complete by:  As directed    Diagnosis:   Coronary Stents PTCA     Call MD for:  redness, tenderness, or signs of infection (pain, swelling, redness, odor or green/yellow discharge around incision site)    Complete by:  As directed    Diet - low sodium heart healthy    Complete by:  As directed    Discharge instructions    Complete by:  As directed    Groin Site Care Refer to this sheet in the next few weeks. These instructions provide you with information on caring for yourself after your procedure. Your caregiver may also give you more specific instructions. Your treatment has been planned according to current medical practices, but problems sometimes occur. Call your caregiver if you have any problems or questions after your procedure. HOME CARE INSTRUCTIONS You may shower 24 hours after the procedure. Remove the bandage (dressing) and gently wash the site with plain soap and water. Gently pat the site dry.  Do not apply powder or lotion to the site.  Do not sit in a bathtub, swimming pool, or whirlpool for 5 to 7 days.  No bending, squatting, or lifting anything over 10 pounds (4.5 kg) as directed by your caregiver.  Inspect the site at least twice daily.    Do not drive home if you are discharged the same day of the procedure. Have someone else drive you.  You may drive 24 hours after the procedure unless otherwise instructed by your caregiver.  What to expect: Any bruising will usually fade within 1 to 2 weeks.  Blood that collects in the tissue (hematoma) may be painful to the touch. It should usually decrease in size and tenderness within 1 to 2 weeks.  SEEK IMMEDIATE MEDICAL CARE IF: You have unusual pain at the groin site or down the affected leg.  You have redness, warmth, swelling, or pain at the groin site.  You have drainage (other than a small amount of blood on the dressing).  You have chills.  You have a fever or persistent symptoms for more than 72 hours.  You have a fever and your symptoms suddenly get worse.  Your leg becomes pale, cool, tingly, or numb.  You have heavy bleeding from the site. Hold pressure on the site. Marland Kitchen  PLEASE DO NOT MISS ANY DOSES OF YOUR PLAVIX!!!!! Also keep a log of you blood pressures and bring back to your follow up appt. Please call the office with any questions.   Patients taking blood thinners should generally stay away from medicines like ibuprofen, Advil, Motrin, naproxen, and Aleve due to risk of stomach bleeding. You may take Tylenol as directed or talk to your primary doctor about alternatives.  Some studies suggest Prilosec/Omeprazole interacts with Plavix. We changed your Prilosec/Omeprazole to the equivalent dose of Protonix for less chance of interaction.   Increase activity slowly    Complete by:  As directed       Discharge Medications     Medication List    STOP taking these medications   omeprazole 40 MG capsule Commonly known as:  PRILOSEC Replaced by:  pantoprazole 40 MG tablet     TAKE these medications   acetaminophen 325 MG tablet Commonly known as:  TYLENOL Take 1-2 tablets (325-650 mg total) by mouth every 4 (four) hours as needed for mild pain.   aspirin EC 81  MG tablet Take 81 mg by mouth daily.   atorvastatin 10 MG tablet Commonly known as:  LIPITOR TAKE ONE TABLET BY MOUTH EVERY DAY AT 6 PM.   carvedilol 12.5 MG tablet Commonly known as:  COREG Take 1 tablet (12.5 mg total) by mouth 2 (two) times daily.   clopidogrel 75 MG tablet Commonly known as:  PLAVIX Take 75 mg by mouth daily.   losartan 50 MG tablet Commonly known as:  COZAAR Take 1 tablet (50 mg total) by mouth daily.   nitroGLYCERIN 0.4 MG SL tablet Commonly known as:  NITROSTAT Place 0.4 mg under the tongue every 5 (five) minutes as needed for chest pain (MAX 3 TABLETS).   pantoprazole 40 MG tablet Commonly known as:  PROTONIX Take 1 tablet (40 mg total) by mouth daily. Replaces:  omeprazole 40 MG capsule   ranitidine 150 MG tablet Commonly known as:  ZANTAC Take 1 tablet (150 mg total) by mouth at bedtime. Only if needed. What changed:  additional instructions   Vitamin D3 5000 units Caps Take 5,000 Units by mouth daily.        Aspirin prescribed at discharge?  Yes High Intensity Statin Prescribed? (Lipitor 40-80mg  or Crestor 20-40mg ): No, unable to tolerate higher dose statin. Consider referral to lipid clinic.  Beta Blocker Prescribed? Yes For EF <40%, was ACEI/ARB Prescribed? Yes ADP Receptor Inhibitor Prescribed? (i.e. Plavix etc.-Includes Medically Managed Patients): Yes For EF <40%, Aldosterone Inhibitor Prescribed? No Was EF assessed during THIS hospitalization? No: Known severely reduced EF, AICD Was Cardiac Rehab II ordered? (Included Medically managed Patients): Yes   Outstanding Labs/Studies   N/a   Duration of Discharge Encounter   Greater than 30 minutes including physician time.  Signed, Rayhaan Huster NP-C 10/13/2016, 10:44 AM   I have examined the patient and reviewed assessment and plan and discussed with patient.  Agree with above as stated.      GEN: Well nourished, well developed, in no acute distress  HEENT: normal  Neck:  no JVD, carotid bruits, or masses Cardiac: RRR; no murmurs, rubs, or gallops,no edema  Respiratory:  clear to auscultation bilaterally, normal work of breathing GI: soft, nontender, nondistended,  MS: no deformity or atrophy ; no right groin hematoma; right foot warm and appears adequately perfused. Skin: warm and dry, no rash Neuro:  Strength and sensation are intact Psych: euthymic mood, full affect  Stressed importance of DAPT.  COntinue agggressive secondary prevention as noted above.  Plan discharge today.  Larae Grooms

## 2016-10-13 NOTE — H&P (Deleted)
The history and physical are unchanged when compared to the cardiology office note noted on 09/21/2016. He is undergoing multisite percutaneous coronary intervention today from the right femoral approach.  The patient was counseled to undergo left heart catheterization, coronary angiography, and possible percutaneous coronary intervention with stent implantation. The procedural risks and benefits were discussed in detail. The risks discussed included death, stroke, myocardial infarction, life-threatening bleeding, limb ischemia, kidney injury, allergy, and possible emergency cardiac surgery. The risk of these significant complications were estimated to occur less than 1% of the time. After discussion, the patient has agreed to proceed.

## 2016-10-17 ENCOUNTER — Telehealth: Payer: Self-pay | Admitting: Interventional Cardiology

## 2016-10-17 ENCOUNTER — Ambulatory Visit (INDEPENDENT_AMBULATORY_CARE_PROVIDER_SITE_OTHER): Payer: Medicare Other | Admitting: Internal Medicine

## 2016-10-17 ENCOUNTER — Encounter: Payer: Self-pay | Admitting: Internal Medicine

## 2016-10-17 VITALS — BP 142/82 | HR 83 | Ht 73.0 in | Wt 185.0 lb

## 2016-10-17 DIAGNOSIS — I5022 Chronic systolic (congestive) heart failure: Secondary | ICD-10-CM

## 2016-10-17 DIAGNOSIS — I2589 Other forms of chronic ischemic heart disease: Secondary | ICD-10-CM

## 2016-10-17 DIAGNOSIS — Z95810 Presence of automatic (implantable) cardiac defibrillator: Secondary | ICD-10-CM

## 2016-10-17 DIAGNOSIS — I255 Ischemic cardiomyopathy: Secondary | ICD-10-CM | POA: Diagnosis not present

## 2016-10-17 NOTE — Telephone Encounter (Signed)
Pt states he had stents placed last week and Dr. Tamala Julian told him that he wanted to see him back this week.  Pt was given appt on 10/24.  Scheduled pt for sooner appt on 10/11.  Pt appreciative for call.

## 2016-10-17 NOTE — Telephone Encounter (Signed)
Walk In pt Form-Pt Needs Appointment. Placed in [employer].

## 2016-10-17 NOTE — Patient Instructions (Addendum)
Medication Instructions:  Your physician recommends that you continue on your current medications as directed. Please refer to the Current Medication list given to you today.  Labwork: None ordered.  Testing/Procedures: None ordered.  Follow-Up: Your physician wants you to follow-up in: nine months with Dr. Lovena Le.   You will receive a reminder letter in the mail two months in advance. If you don't receive a letter, please call our office to schedule the follow-up appointment.  Remote monitoring is used to monitor your Pacemaker from home. This monitoring reduces the number of office visits required to check your device to one time per year. It allows [place] to keep an eye on the functioning of your device to ensure it is working properly. You are scheduled for a device check from home on 01/17/2016. You may send your transmission at any time that day. If you have a wireless device, the transmission will be sent automatically. After your physician reviews your transmission, you will receive a postcard with your next transmission date.    Any Other Special Instructions Will Be Listed Below (If Applicable).     If you need a refill on your cardiac medications before your next appointment, please call your pharmacy.

## 2016-10-17 NOTE — Progress Notes (Signed)
HPI Mr. Iddings returns today for ongoing evaluation and management of his ICD. He is a very pleasant 66 year old man with coronary artery disease who has an ischemic cardiomyopathy. He saw my partner Dr. Tamala Julian several weeks ago and noted nocturnal angina. He underwent heart catheterization which demonstrated multiple diagonal and RCA and circumflex marginal stenoses and underwent complex coronary intervention of multiple arteries utilizing drug-eluting stents. In the interim he has had no anginal symptoms. He has class I heart failure symptoms. He denies syncope. No ICD shocks. He initially had a very large ICD pocket hematoma and this is healed up nicely. Allergies  Allergen Reactions  . Fish Oil Other (See Comments)    FEELS BAD      Current Outpatient Prescriptions  Medication Sig Dispense Refill  . acetaminophen (TYLENOL) 325 MG tablet Take 1-2 tablets (325-650 mg total) by mouth every 4 (four) hours as needed for mild pain.    Marland Kitchen aspirin EC 81 MG tablet Take 81 mg by mouth daily.    Marland Kitchen atorvastatin (LIPITOR) 10 MG tablet TAKE ONE TABLET BY MOUTH EVERY DAY AT 6 PM. 90 tablet 3  . carvedilol (COREG) 12.5 MG tablet Take 1 tablet (12.5 mg total) by mouth 2 (two) times daily. 180 tablet 3  . Cholecalciferol (VITAMIN D3) 5000 units CAPS Take 5,000 Units by mouth daily.    . clopidogrel (PLAVIX) 75 MG tablet Take 75 mg by mouth daily.  3  . finasteride (PROPECIA) 1 MG tablet Take 1 mg by mouth daily.  0  . losartan (COZAAR) 50 MG tablet Take 1 tablet (50 mg total) by mouth daily. 90 tablet 3  . nitroGLYCERIN (NITROSTAT) 0.4 MG SL tablet Place 0.4 mg under the tongue every 5 (five) minutes as needed for chest pain (MAX 3 TABLETS).    . pantoprazole (PROTONIX) 40 MG tablet Take 1 tablet (40 mg total) by mouth daily. 30 tablet 2  . ranitidine (ZANTAC) 150 MG tablet Take 1 tablet (150 mg total) by mouth at bedtime. Only if needed. (Patient taking differently: Take 150 mg by mouth at bedtime. )      No current facility-administered medications for this visit.      Past Medical History:  Diagnosis Date  . AICD (automatic cardioverter/defibrillator) present    BIV  . Anginal pain ([place])   . Blood transfusion without reported diagnosis   . CAD (coronary artery disease)    with CABG LIMA to LAD, free radial PDA, SVG to diagonal, SVG to OM. 2002. DES May 2011 and 01-2010 to SVG of Diag, native OM, and Native RCA. Unable to stent LAD via LIMA10/18 PCI to dRCA, multi-site DES to SVG--> OM  . GERD (gastroesophageal reflux disease)   . Hyperlipidemia    unable to tolarate statin therapy  . Hypertension   . Kidney stone   . Vertigo     ROS:   All systems reviewed and negative except as noted in the HPI.   Past Surgical History:  Procedure Laterality Date  . BIV ICD INSERTION CRT-D N/A 07/11/2016   Procedure: BiV ICD Insertion CRT-D;  Surgeon: Evans Lance, MD;  Location: [place] [facility];  Service: Cardiovascular;  Laterality: N/A;  . CHOLECYSTECTOMY    . COLONOSCOPY    . CORONARY ARTERY BYPASS GRAFT    . CORONARY BALLOON ANGIOPLASTY N/A 10/12/2016   Procedure: CORONARY BALLOON ANGIOPLASTY;  Surgeon: Belva Crome, MD;  Location: [place] [facility];  Service: Cardiovascular;  Laterality:  N/A;  . CORONARY STENT INTERVENTION  10/12/2016   PTCA of focal in-stent restenosis in the second obtuse marginal reducing 90% stenosis to 0%.  . CORONARY STENT INTERVENTION N/A 10/12/2016   Procedure: CORONARY STENT INTERVENTION;  Surgeon: Belva Crome, MD;  Location: [place] [facility];  Service: Cardiovascular;  Laterality: N/A;  . LEFT HEART CATH AND CORS/GRAFTS ANGIOGRAPHY N/A 10/12/2016   Procedure: LEFT HEART CATH AND CORS/GRAFTS ANGIOGRAPHY;  Surgeon: Belva Crome, MD;  Location: [place] [facility];  Service: Cardiovascular;  Laterality: N/A;  . RIGHT/LEFT HEART CATH AND CORONARY/GRAFT ANGIOGRAPHY N/A 03/01/2016   Procedure: Right/Left Heart Cath and Coronary/Graft Angiography;   Surgeon: Belva Crome, MD;  Location: [place] [facility];  Service: Cardiovascular;  Laterality: N/A;     Family History  Problem Relation Age of Onset  . Colon cancer Father   . Sudden death Neg Hx   . Hypertension Neg Hx   . Hyperlipidemia Neg Hx   . Heart attack Neg Hx   . Diabetes Neg Hx   . Rectal cancer Neg Hx   . Stomach cancer Neg Hx      Social History   Social History  . Marital status: Married    Spouse name: N/A  . Number of children: N/A  . Years of education: N/A   Occupational History  . Not on file.   Social History Main Topics  . Smoking status: Never Smoker  . Smokeless tobacco: Never Used  . Alcohol use 0.6 oz/week    1 Glasses of wine per week     Comment: RARE  . Drug use: No  . Sexual activity: Not on file   Other Topics Concern  . Not on file   Social History Narrative  . No narrative on file     BP (!) 142/82   Pulse 83   Ht 6\' 1"  (1.854 m)   Wt 185 lb (83.9 kg)   SpO2 97%   BMI 24.41 kg/m   Physical Exam:  Well appearing NAD HEENT: Unremarkable Neck:  6 cm JVD, no thyromegally Lymphatics:  No adenopathy Back:  No CVA tenderness Lungs:  Clear, With no wheezes, rales, or rhonchi. Well-healed ICD incision HEART:  Regular rate rhythm, no murmurs, no rubs, no clicks Abd:  soft, positive bowel sounds, no organomegally, no rebound, no guarding Ext:  2 plus pulses, no edema, no cyanosis, no clubbing Skin:  No rashes no nodules Neuro:  CN II through XII intact, motor grossly intact   DEVICE  Normal device function.  See PaceArt for details.   Assess/Plan: 1. Chronic systolic heart failure - his symptoms remain class I. He has severe left ventricular dysfunction. He is status post biventricular ICD implantation. 2. Ischemic cardiomyopathy - he denies anginal symptoms. He is status post multiple stents. He will continue platelet inhibition. 3. ICD - his [employer] biventricular ICD was interrogated today and is working  normally. We'll recheck in several months. He has an estimated 11 years of longevity.  Cristopher Peru, M.D.

## 2016-10-17 NOTE — Telephone Encounter (Signed)
Follow Up:; ° ° °Returning your call. °

## 2016-10-19 ENCOUNTER — Ambulatory Visit (INDEPENDENT_AMBULATORY_CARE_PROVIDER_SITE_OTHER): Payer: Medicare Other | Admitting: Interventional Cardiology

## 2016-10-19 ENCOUNTER — Encounter: Payer: Self-pay | Admitting: Interventional Cardiology

## 2016-10-19 ENCOUNTER — Telehealth: Payer: Self-pay | Admitting: *Deleted

## 2016-10-19 VITALS — BP 122/70 | HR 75 | Ht 73.0 in | Wt 186.2 lb

## 2016-10-19 DIAGNOSIS — I10 Essential (primary) hypertension: Secondary | ICD-10-CM | POA: Diagnosis not present

## 2016-10-19 DIAGNOSIS — I5022 Chronic systolic (congestive) heart failure: Secondary | ICD-10-CM

## 2016-10-19 DIAGNOSIS — I255 Ischemic cardiomyopathy: Secondary | ICD-10-CM | POA: Diagnosis not present

## 2016-10-19 DIAGNOSIS — I209 Angina pectoris, unspecified: Secondary | ICD-10-CM | POA: Diagnosis not present

## 2016-10-19 DIAGNOSIS — I25709 Atherosclerosis of coronary artery bypass graft(s), unspecified, with unspecified angina pectoris: Secondary | ICD-10-CM | POA: Diagnosis not present

## 2016-10-19 DIAGNOSIS — E7849 Other hyperlipidemia: Secondary | ICD-10-CM | POA: Diagnosis not present

## 2016-10-19 NOTE — Patient Instructions (Signed)
Medication Instructions:  Your physician recommends that you continue on your current medications as directed. Please refer to the Current Medication list given to you today.   Labwork: None  Testing/Procedures: None  Follow-Up: Your physician recommends that you schedule a follow-up appointment with our Lipid Clinic to see if you qualify for a PCSK9.   Your physician wants you to follow-up in: February 2019 with Dr. Tamala Julian. You will receive a reminder letter in the mail two months in advance. If you don't receive a letter, please call our office to schedule the follow-up appointment.   Any Other Special Instructions Will Be Listed Below (If Applicable).     If you need a refill on your cardiac medications before your next appointment, please call your pharmacy.

## 2016-10-19 NOTE — Telephone Encounter (Signed)
-----   Message from Loren Racer, LPN sent at [id number]/7619  3:16 PM EDT ----- Stark Bray,  Pt was in to see Dr. Tamala Julian today and would like to hold off on his sleep study until next year.  Can you get the current one cancelled and have them contact him to reschedule?  Thanks Marveen Reeks

## 2016-10-19 NOTE — Telephone Encounter (Signed)
Called the sleep lab spoke to S[facility] and cancelled the patient's sleep study. Karna Christmas will call the patient to reschedule an appointment for next year.

## 2016-10-19 NOTE — Progress Notes (Signed)
Cardiology Office Note    Date:  10/19/2016   ID:  Tommy [name], DOB 1950/12/13, MRN [id number]  PCP:  Patient, No Pcp Per  Cardiologist: Sinclair Grooms, MD   Chief Complaint  Patient presents with  . Coronary Artery Disease    History of Present Illness:  Tommy [name] is a 66 y.o. male with ischemic cardiomyopathy, exertional angina pectoris, prior coronary bypass grafting 2002 with bypass graft failure and multiple native and bypass graft stents most recently October 2018, CRT-D 2018, hypertension, hyperlipidemia and chronic systolic heart failure.  Tommy [name] had multisite PCI last week including native RCA, SVG to diagonal, and native circumflex. He was discharged in a state. Since discharge she has had no angina. Not need to use nitroglycerin. Feels much better than he has in 6-9 months. He is not back to exercise. He denies nitroglycerin use. No orthopnea or PND.  Past Medical History:  Diagnosis Date  . AICD (automatic cardioverter/defibrillator) present    BIV  . Anginal pain (Tommy [name])   . Blood transfusion without reported diagnosis   . CAD (coronary artery disease)    with CABG LIMA to LAD, free radial PDA, SVG to diagonal, SVG to OM. 2002. DES May 2011 and 01-2010 to SVG of Diag, native OM, and Native RCA. Unable to stent LAD via LIMA10/18 PCI to dRCA, multi-site DES to SVG--> OM  . GERD (gastroesophageal reflux disease)   . Hyperlipidemia    unable to tolarate statin therapy  . Hypertension   . Kidney stone   . Vertigo     Past Surgical History:  Procedure Laterality Date  . BIV ICD INSERTION CRT-D N/A 07/11/2016   Procedure: BiV ICD Insertion CRT-D;  Surgeon: Evans Lance, MD;  Location: [facility];  Service: Cardiovascular;  Laterality: N/A;  . CHOLECYSTECTOMY    . COLONOSCOPY    . CORONARY ARTERY BYPASS GRAFT    . CORONARY BALLOON ANGIOPLASTY N/A 10/12/2016   Procedure: CORONARY BALLOON ANGIOPLASTY;  Surgeon: Belva Crome, MD;  Location: [facility];  Service: Cardiovascular;  Laterality: N/A;  . CORONARY STENT INTERVENTION  10/12/2016   PTCA of focal in-stent restenosis in the second obtuse marginal reducing 90% stenosis to 0%.  . CORONARY STENT INTERVENTION N/A 10/12/2016   Procedure: CORONARY STENT INTERVENTION;  Surgeon: Belva Crome, MD;  Location: [facility];  Service: Cardiovascular;  Laterality: N/A;  . LEFT HEART CATH AND CORS/GRAFTS ANGIOGRAPHY N/A 10/12/2016   Procedure: LEFT HEART CATH AND CORS/GRAFTS ANGIOGRAPHY;  Surgeon: Belva Crome, MD;  Location: [facility];  Service: Cardiovascular;  Laterality: N/A;  . RIGHT/LEFT HEART CATH AND CORONARY/GRAFT ANGIOGRAPHY N/A 03/01/2016   Procedure: Right/Left Heart Cath and Coronary/Graft Angiography;  Surgeon: Belva Crome, MD;  Location: [facility];  Service: Cardiovascular;  Laterality: N/A;    Current Medications: Outpatient Medications Prior to Visit  Medication Sig Dispense Refill  . acetaminophen (TYLENOL) 325 MG tablet Take 1-2 tablets (325-650 mg total) by mouth every 4 (four) hours as needed for mild pain.    Marland Kitchen aspirin EC 81 MG tablet Take 81 mg by mouth daily.    Marland Kitchen atorvastatin (LIPITOR) 10 MG tablet TAKE ONE TABLET BY MOUTH EVERY DAY AT 6 PM. 90 tablet 3  . carvedilol (COREG) 12.5 MG tablet Take 1 tablet (12.5 mg total) by mouth 2 (two) times daily. 180 tablet 3  . Cholecalciferol (VITAMIN D3) 5000 units CAPS Take 5,000  Units by mouth daily.    . clopidogrel (PLAVIX) 75 MG tablet Take 75 mg by mouth daily.  3  . finasteride (PROPECIA) 1 MG tablet Take 1 mg by mouth daily.  0  . losartan (COZAAR) 50 MG tablet Take 1 tablet (50 mg total) by mouth daily. 90 tablet 3  . nitroGLYCERIN (NITROSTAT) 0.4 MG SL tablet Place 0.4 mg under the tongue every 5 (five) minutes as needed for chest pain (MAX 3 TABLETS).    . pantoprazole (PROTONIX) 40 MG tablet Take 1 tablet (40 mg total) by mouth daily. 30 tablet 2  . ranitidine (ZANTAC) 150 MG  tablet Take 1 tablet (150 mg total) by mouth at bedtime. Only if needed. (Patient taking differently: Take 150 mg by mouth at bedtime. )     No facility-administered medications prior to visit.      Allergies:   Fish oil   Social History   Social History  . Marital status: Married    Spouse name: N/A  . Number of children: N/A  . Years of education: N/A   Social History Main Topics  . Smoking status: Never Smoker  . Smokeless tobacco: Never Used  . Alcohol use 0.6 oz/week    1 Glasses of wine per week     Comment: RARE  . Drug use: No  . Sexual activity: Not Asked   Other Topics Concern  . None   Social History Narrative  . None     Family History:  The patient's family history includes Colon cancer in his father.   ROS:   Please see the history of present illness.    No calf site complications or complaints. Unable tolerate statins. All other systems reviewed and are negative.   PHYSICAL EXAM:   VS:  BP 122/70 (BP Location: Left Arm)   Pulse 75   Ht 6\' 1"  (1.854 m)   Wt 186 lb 3.2 oz (84.5 kg)   BMI 24.57 kg/m    GEN: Well nourished, well developed, in no acute distress  HEENT: normal  Neck: no JVD, carotid bruits, or masses Cardiac: RRR; no murmurs, rubs, or gallops,no edema  Respiratory:  clear to auscultation bilaterally, normal work of breathing GI: soft, nontender, nondistended, + BS MS: no deformity or atrophy  Skin: warm and dry, no rash Neuro:  Alert and Oriented x 3, Strength and sensation are intact Psych: euthymic mood, full affect  Wt Readings from Last 3 Encounters:  10/19/16 186 lb 3.2 oz (84.5 kg)  10/17/16 185 lb (83.9 kg)  10/12/16 205 lb (93 kg)      Studies/Labs Reviewed:   EKG:  EKG  Not repeated  Recent Labs: 02/09/2016: ALT 50 10/13/2016: BUN 16; Creatinine, Ser 0.88; Hemoglobin 13.0; Platelets 249; Potassium 4.0; Sodium 137   Lipid Panel    Component Value Date/Time   CHOL 164 02/09/2016 1135   TRIG 162 (H) 02/09/2016  1135   HDL 45 02/09/2016 1135   CHOLHDL 3.6 02/09/2016 1135   CHOLHDL 3.3 03/03/2015 0744   VLDL 22 03/03/2015 0744   LDLCALC 87 02/09/2016 1135   LDLDIRECT 91.0 02/05/2014 0809    Additional studies/ records that were reviewed today include:  Cath/PCI note 10/2016: Coronary Diagrams   Diagnostic Diagram       Post-Intervention Diagram           ASSESSMENT:    1. Coronary artery disease involving coronary bypass graft of native heart with angina pectoris ([place])   2. HYPERTENSION, BENIGN  SYSTEMIC   3. Chronic systolic heart failure ([facility])   4. Other hyperlipidemia      PLAN:  In order of problems listed above:  1. Improved significantly after multisite PCI. 2. Excellent control 3. Resynchronization therapy and hopefully now with improved myocardial perfusion, LV function may get better. 4. Lipids are not optimal. He is unable to tolerate statins at a level that allows target LDL. Referred to lipid clinic for consideration of DC SK9 therapy. Last LDL was 164. He is now on atorvastatin 10 mg per day. He could not tolerate a higher dose. He gets severe musculoskeletal pain and difficulty with mobility. LDL target should be less than 70 and preferably in the 40 range given his extent of coronary disease.  Four-month follow-up. Call if angina.    Medication Adjustments/Labs and Tests Ordered: Current medicines are reviewed at length with the patient today.  Concerns regarding medicines are outlined above.  Medication changes, Labs and Tests ordered today are listed in the Patient Instructions below. Patient Instructions  Medication Instructions:  Your physician recommends that you continue on your current medications as directed. Please refer to the Current Medication list given to you today.   Labwork: None  Testing/Procedures: None  Follow-Up: Your physician recommends that you schedule a follow-up appointment with our Lipid Clinic to see if you qualify for a  PCSK9.   Your physician wants you to follow-up in: February 2019 with Dr. Tamala Julian. You will receive a reminder letter in the mail two months in advance. If you don't receive a letter, please call our office to schedule the follow-up appointment.   Any Other Special Instructions Will Be Listed Below (If Applicable).     If you need a refill on your cardiac medications before your next appointment, please call your pharmacy.      Signed, Sinclair Grooms, MD  10/19/2016 3:18 PM    [facility] [address], [place], [place]  [address] Phone: [phone]; Fax: [phone]

## 2016-10-24 ENCOUNTER — Ambulatory Visit (INDEPENDENT_AMBULATORY_CARE_PROVIDER_SITE_OTHER): Payer: Medicare Other | Admitting: Pharmacist

## 2016-10-24 DIAGNOSIS — I255 Ischemic cardiomyopathy: Secondary | ICD-10-CM

## 2016-10-24 DIAGNOSIS — E782 Mixed hyperlipidemia: Secondary | ICD-10-CM | POA: Diagnosis not present

## 2016-10-24 LAB — LIPID PANEL
CHOLESTEROL TOTAL: 154 mg/dL (ref 100–199)
Chol/HDL Ratio: 3.4 ratio (ref 0.0–5.0)
HDL: 45 mg/dL (ref >39)
LDL Calculated: 65 mg/dL (ref 0–99)
TRIGLYCERIDES: 219 mg/dL — AB (ref 0–149)
VLDL CHOLESTEROL CAL: 44 mg/dL — AB (ref 5–40)

## 2016-10-24 LAB — LDL CHOLESTEROL, DIRECT: LDL DIRECT: 81 mg/dL (ref 0–99)

## 2016-10-24 NOTE — Patient Instructions (Signed)
It was nice to meet you today  Call Jinny Blossom in the lipid clinic when your new insurance is activated [phone]. We will start the paperwork process to get you on either Praluent or Repatha

## 2016-10-24 NOTE — Progress Notes (Signed)
Patient ID: Tommy [name]                 DOB: Apr 28, 1950                    MRN: [id number]     HPI: Tommy [name] is a 66 y.o. male patient referred to lipid clinic by Dr Tamala Julian. PMH is significant for ischemic CMP, exertional angina, prior CABG in 2002 with bypass graft failure and multiple native and bypass graft stents, HTN, HLD, and chronic systolic HF. In October 2018, pt had multisite PCI including native RCA, SVG to diagonal, and native circumflex. His LDL was 87 at this time on Lipitor 10mg  daily.  Pt presents today in good spirits. He states that after his 4th CABG last month, he tried to increase his Lipitor to 10mg  BID. He has been experiencing joint pain everywhere and hand numbness on this dose. Previously, he felt ok on Lipitor 10mg  daily. He experienced similar joint pain and hand numbness on Crestor and pravastatin. He has also tried Zetia in the past which was ineffective.  Pt is a marathon runner and continues to run 35-40 miles per week. He has completed [place]. His wife coaches cross country and runs half marathons with him and their children. He eats well and focuses on eating a low fat diet.   He does not have any family history of heart disease - his mother lived until age 51 and died in her sleep. His 3 older siblings do not have issues with their heart or cholesterol either. Pt is understandably frustrated that he is doing everything right but continues to need stents placed. He has lost track of how many stents he has needed placed.  Of note, pt currently does not have insurance. He is signing up for a Medicare Part D plan soon.  Current Medications: atorvastatin 10mg  daily - has been taking 20mg  daily for the past few weeks but is not tolerating well Intolerances: Crestor, pravastatin, and Lipitor 20mg  daily - hand numbness and joint pain, Welchol 3.75g daily, Zetia - ineffective Risk Factors: angina, CAD, CABG LDL goal: 40mg /dL per Dr Tamala Julian due to  extensive coronary history  Diet: Low fat diet.  Exercise: Runs 5-6 days a week - 35-40 miles per week. Has completed 5 [place] marathons   Family History: Colon cancer in his father.   Social History: Denies tobacco and illicit drug use. Rarely drinks alcohol.  Labs: 02/09/16: TC 164, TG 162, HDL 45, LDL 87 (atorvastatin 10mg  daily)  Past Medical History:  Diagnosis Date  . AICD (automatic cardioverter/defibrillator) present    BIV  . Anginal pain ([place])   . Blood transfusion without reported diagnosis   . CAD (coronary artery disease)    with CABG LIMA to LAD, free radial PDA, SVG to diagonal, SVG to OM. 2002. DES May 2011 and 01-2010 to SVG of Diag, native OM, and Native RCA. Unable to stent LAD via LIMA10/18 PCI to dRCA, multi-site DES to SVG--> OM  . GERD (gastroesophageal reflux disease)   . Hyperlipidemia    unable to tolarate statin therapy  . Hypertension   . Kidney stone   . Vertigo     Current Outpatient Prescriptions on File Prior to Visit  Medication Sig Dispense Refill  . acetaminophen (TYLENOL) 325 MG tablet Take 1-2 tablets (325-650 mg total) by mouth every 4 (four) hours as needed for mild pain.    Marland Kitchen aspirin EC 81 MG tablet  Take 81 mg by mouth daily.    Marland Kitchen atorvastatin (LIPITOR) 10 MG tablet TAKE ONE TABLET BY MOUTH EVERY DAY AT 6 PM. 90 tablet 3  . carvedilol (COREG) 12.5 MG tablet Take 1 tablet (12.5 mg total) by mouth 2 (two) times daily. 180 tablet 3  . Cholecalciferol (VITAMIN D3) 5000 units CAPS Take 5,000 Units by mouth daily.    . clopidogrel (PLAVIX) 75 MG tablet Take 75 mg by mouth daily.  3  . finasteride (PROPECIA) 1 MG tablet Take 1 mg by mouth daily.  0  . losartan (COZAAR) 50 MG tablet Take 1 tablet (50 mg total) by mouth daily. 90 tablet 3  . nitroGLYCERIN (NITROSTAT) 0.4 MG SL tablet Place 0.4 mg under the tongue every 5 (five) minutes as needed for chest pain (MAX 3 TABLETS).    . pantoprazole (PROTONIX) 40 MG tablet Take 1 tablet (40 mg total) by  mouth daily. 30 tablet 2  . ranitidine (ZANTAC) 150 MG tablet Take 1 tablet (150 mg total) by mouth at bedtime. Only if needed. (Patient taking differently: Take 150 mg by mouth at bedtime. )     No current facility-administered medications on file prior to visit.     Allergies  Allergen Reactions  . Fish Oil Other (See Comments)    FEELS BAD     Assessment/Plan:  1. Hyperlipidemia - Pt has a very extensive history of cardiac disease with multiple CABGs and stents placed. Most recently, pt had 5 stents placed in October 2018 despite his LDL being 87. Pt is a marathon runner and runs 40+ miles each week and follows a low fat diet. He is adherent to Lipitor 10mg  daily but is intolerant to higher doses and other statins (joint aches and hand numbness), and Zetia was ineffective. Need to treat cholesterol extremely aggressively with LDL goal < 40mg /dL per Dr Tamala Julian. PCSK9i are the only option to reduce risk for further heart disease. Discussed expected benefits, side effects, and injection technique. Pt is in the process of signing up for a Medicare D plan but is currently uninsured. Will check baseline lipids today since this will likely be required for future prior authorization. Also had pt fill out patient assistance paperwork if Medicare copays are cost prohibitive. Given patient's elevated risk and progressive CAD, I provided pt with Praluent samples and he successfully self-administered his first Praluent 75mg /mL pen in clinic today. Pt will call clinic once his future insurance is activated so that we can get PCSK9i approved through his insurance.   Kymani Shimabukuro E. Gianella Chismar, PharmD, CPP, [facility] [address], [place], [place] [address] Phone: [phone]; Fax: [phone] 10/24/2016 11:30 AM

## 2016-10-28 LAB — CUP PACEART INCLINIC DEVICE CHECK
Brady Statistic RA Percent Paced: 1 %
Brady Statistic RV Percent Paced: 3 %
HighPow Impedance: 72 Ohm
Implantable Lead Implant Date: 20180703
Implantable Lead Location: 753859
Implantable Lead Model: 4674
Implantable Lead Serial Number: 433305
Implantable Lead Serial Number: 901025
Implantable Pulse Generator Implant Date: 20180703
Lead Channel Impedance Value: 739 Ohm
Lead Channel Pacing Threshold Amplitude: 0.9 V
Lead Channel Pacing Threshold Pulse Width: 0.4 ms
Lead Channel Sensing Intrinsic Amplitude: 25 mV
Lead Channel Sensing Intrinsic Amplitude: 6.8 mV
Lead Channel Setting Pacing Amplitude: 1.5 V
Lead Channel Setting Sensing Sensitivity: 0.6 mV
Lead Channel Setting Sensing Sensitivity: 1 mV
MDC IDC LEAD IMPLANT DT: 20180703
MDC IDC LEAD IMPLANT DT: 20180703
MDC IDC LEAD LOCATION: 753858
MDC IDC LEAD LOCATION: 753860
MDC IDC LEAD SERIAL: 801469
MDC IDC MSMT LEADCHNL LV IMPEDANCE VALUE: 678 Ohm
MDC IDC MSMT LEADCHNL LV PACING THRESHOLD PULSEWIDTH: 0.4 ms
MDC IDC MSMT LEADCHNL RA IMPEDANCE VALUE: 597 Ohm
MDC IDC MSMT LEADCHNL RA PACING THRESHOLD AMPLITUDE: 0.9 V
MDC IDC MSMT LEADCHNL RV PACING THRESHOLD AMPLITUDE: 0.5 V
MDC IDC MSMT LEADCHNL RV PACING THRESHOLD PULSEWIDTH: 0.4 ms
MDC IDC MSMT LEADCHNL RV SENSING INTR AMPL: 25 mV — AB
MDC IDC SESS DTM: 20181009040000
MDC IDC SET LEADCHNL LV PACING PULSEWIDTH: 0.4 ms
MDC IDC SET LEADCHNL RA PACING AMPLITUDE: 2 V
MDC IDC SET LEADCHNL RV PACING AMPLITUDE: 2 V
MDC IDC SET LEADCHNL RV PACING PULSEWIDTH: 0.4 ms
Pulse Gen Serial Number: 169965

## 2016-10-31 ENCOUNTER — Encounter (HOSPITAL_BASED_OUTPATIENT_CLINIC_OR_DEPARTMENT_OTHER): Payer: Medicare Other

## 2016-11-01 ENCOUNTER — Ambulatory Visit: Payer: Medicare Other | Admitting: Interventional Cardiology

## 2016-11-08 DIAGNOSIS — Z23 Encounter for immunization: Secondary | ICD-10-CM | POA: Diagnosis not present

## 2016-11-08 DIAGNOSIS — D1801 Hemangioma of skin and subcutaneous tissue: Secondary | ICD-10-CM | POA: Diagnosis not present

## 2016-11-08 DIAGNOSIS — L814 Other melanin hyperpigmentation: Secondary | ICD-10-CM | POA: Diagnosis not present

## 2016-11-08 DIAGNOSIS — D692 Other nonthrombocytopenic purpura: Secondary | ICD-10-CM | POA: Diagnosis not present

## 2016-11-08 DIAGNOSIS — D225 Melanocytic nevi of trunk: Secondary | ICD-10-CM | POA: Diagnosis not present

## 2016-11-08 DIAGNOSIS — L821 Other seborrheic keratosis: Secondary | ICD-10-CM | POA: Diagnosis not present

## 2016-11-08 DIAGNOSIS — L570 Actinic keratosis: Secondary | ICD-10-CM | POA: Diagnosis not present

## 2016-12-28 ENCOUNTER — Telehealth: Payer: Self-pay | Admitting: Pharmacist

## 2016-12-28 DIAGNOSIS — E782 Mixed hyperlipidemia: Secondary | ICD-10-CM

## 2016-12-28 NOTE — Telephone Encounter (Signed)
Pt called clinic inquiring about checking cholesterol panel. He will come in tomorrow for fasting lipid panel since he has done 5 Praluent injections (samples). He has  Now signed up for new Medicare insurance and will bring this in so that we can submit prior authorization for PCSK9i therapy.

## 2016-12-29 ENCOUNTER — Telehealth: Payer: Self-pay | Admitting: Pharmacist

## 2016-12-29 ENCOUNTER — Other Ambulatory Visit: Payer: Medicare Other | Admitting: *Deleted

## 2016-12-29 DIAGNOSIS — E782 Mixed hyperlipidemia: Secondary | ICD-10-CM

## 2016-12-29 LAB — LIPID PANEL
CHOLESTEROL TOTAL: 83 mg/dL — AB (ref 100–199)
Chol/HDL Ratio: 1.8 ratio (ref 0.0–5.0)
HDL: 46 mg/dL (ref >39)
LDL Calculated: 11 mg/dL (ref 0–99)
Triglycerides: 131 mg/dL (ref 0–149)
VLDL Cholesterol Cal: 26 mg/dL (ref 5–40)

## 2016-12-29 LAB — HEPATIC FUNCTION PANEL
ALBUMIN: 4.5 g/dL (ref 3.6–4.8)
ALK PHOS: 98 IU/L (ref 39–117)
ALT: 29 IU/L (ref 0–44)
AST: 23 IU/L (ref 0–40)
BILIRUBIN TOTAL: 0.7 mg/dL (ref 0.0–1.2)
Bilirubin, Direct: 0.2 mg/dL (ref 0.00–0.40)
Total Protein: 7 g/dL (ref 6.0–8.5)

## 2016-12-29 NOTE — Telephone Encounter (Signed)
Spoke with pt regarding lipid results - LDL excellent at 11 since starting Praluent. New insurance not active until 01/09/17, will submit PA for Praluent at that time. Pt is aware.

## 2016-12-29 NOTE — Telephone Encounter (Signed)
Pt called clinic with questions about his blood pressure. He went for a run this morning and felt dizzy - his BP was in the 80s/40s. He has tried to go for a run a few days this week and felt dizzy each time but only checked his BP today. His BP readings when he does not exercise are 140s/80s and overall he feels well. He has not gotten dizzy with any other activities aside from running.   I have not previously managed pt for his BP, only his cholesterol. Will route message to Dr Tamala Julian regarding need for any medication adjustments since BP readings are usually a little on the higher side when pt is not exercising.

## 2016-12-30 NOTE — Telephone Encounter (Signed)
Not sure why BP low with exercise. Could be related to meds, device, or other. If it continues , may have to walk on treadmill to figure it out under observation.

## 2017-01-04 NOTE — Telephone Encounter (Signed)
Informed pt of recommendations per Dr. Tamala Julian.  Pt verbalized understanding and was in agreement with this plan.

## 2017-01-10 ENCOUNTER — Telehealth: Payer: Self-pay | Admitting: Pharmacist

## 2017-01-10 MED ORDER — ALIROCUMAB 75 MG/ML ~~LOC~~ SOPN: 1.0000 "pen " | PEN_INJECTOR | SUBCUTANEOUS | 11 refills | Status: DC

## 2017-01-10 NOTE — Telephone Encounter (Signed)
Prior authorization for Praluent has been approved through 02/08/2020. Rx sent to specialty pharmacy. LMOM for pt with update, will also f/u with pt once copay information is available.

## 2017-01-12 ENCOUNTER — Other Ambulatory Visit: Payer: Self-pay | Admitting: Interventional Cardiology

## 2017-01-12 MED ORDER — PANTOPRAZOLE SODIUM 40 MG PO TBEC: 40.0000 mg | DELAYED_RELEASE_TABLET | Freq: Every day | ORAL | 2 refills | Status: DC

## 2017-01-12 NOTE — Telephone Encounter (Signed)
Praluent copay extremely cost prohibitive at $587 per month. Will submit PASS pt assistance. Pt will also come in for samples this afternoon.

## 2017-01-16 ENCOUNTER — Ambulatory Visit (INDEPENDENT_AMBULATORY_CARE_PROVIDER_SITE_OTHER): Payer: Medicare Other | Admitting: *Deleted

## 2017-01-16 DIAGNOSIS — I255 Ischemic cardiomyopathy: Secondary | ICD-10-CM

## 2017-01-16 NOTE — Progress Notes (Signed)
Remote ICD transmission.   

## 2017-01-17 ENCOUNTER — Encounter: Payer: Self-pay | Admitting: Cardiology

## 2017-01-18 LAB — CUP PACEART REMOTE DEVICE CHECK
Date Time Interrogation Session: 20190110134425
Implantable Lead Implant Date: 20180703
Implantable Lead Implant Date: 20180703
Implantable Lead Location: 753860
Implantable Lead Serial Number: 801469
Lead Channel Setting Pacing Amplitude: 1.5 V
Lead Channel Setting Pacing Pulse Width: 0.4 ms
Lead Channel Setting Sensing Sensitivity: 0.6 mV
Lead Channel Setting Sensing Sensitivity: 1 mV
MDC IDC LEAD IMPLANT DT: 20180703
MDC IDC LEAD LOCATION: 753858
MDC IDC LEAD LOCATION: 753859
MDC IDC LEAD SERIAL: 433305
MDC IDC LEAD SERIAL: 901025
MDC IDC PG IMPLANT DT: 20180703
MDC IDC SET LEADCHNL RA PACING AMPLITUDE: 2 V
MDC IDC SET LEADCHNL RV PACING AMPLITUDE: 2 V
MDC IDC SET LEADCHNL RV PACING PULSEWIDTH: 0.4 ms
Pulse Gen Serial Number: 169965

## 2017-01-22 ENCOUNTER — Other Ambulatory Visit: Payer: Self-pay | Admitting: *Deleted

## 2017-01-22 MED ORDER — NITROGLYCERIN 0.4 MG SL SUBL: 0.4000 mg | SUBLINGUAL_TABLET | SUBLINGUAL | 3 refills | Status: DC | PRN

## 2017-02-12 DIAGNOSIS — J101 Influenza due to other identified influenza virus with other respiratory manifestations: Secondary | ICD-10-CM | POA: Diagnosis not present

## 2017-02-12 DIAGNOSIS — R52 Pain, unspecified: Secondary | ICD-10-CM | POA: Diagnosis not present

## 2017-02-12 DIAGNOSIS — R05 Cough: Secondary | ICD-10-CM | POA: Diagnosis not present

## 2017-02-12 DIAGNOSIS — N400 Enlarged prostate without lower urinary tract symptoms: Secondary | ICD-10-CM | POA: Diagnosis not present

## 2017-02-20 ENCOUNTER — Telehealth: Payer: Self-pay | Admitting: Pharmacist

## 2017-02-20 ENCOUNTER — Encounter: Payer: Self-pay | Admitting: Interventional Cardiology

## 2017-02-20 ENCOUNTER — Ambulatory Visit (INDEPENDENT_AMBULATORY_CARE_PROVIDER_SITE_OTHER): Payer: Medicare Other | Admitting: Interventional Cardiology

## 2017-02-20 VITALS — BP 112/78 | HR 75 | Ht 73.0 in | Wt 186.1 lb

## 2017-02-20 DIAGNOSIS — E782 Mixed hyperlipidemia: Secondary | ICD-10-CM | POA: Diagnosis not present

## 2017-02-20 DIAGNOSIS — I5022 Chronic systolic (congestive) heart failure: Secondary | ICD-10-CM

## 2017-02-20 DIAGNOSIS — I209 Angina pectoris, unspecified: Secondary | ICD-10-CM | POA: Diagnosis not present

## 2017-02-20 DIAGNOSIS — I25709 Atherosclerosis of coronary artery bypass graft(s), unspecified, with unspecified angina pectoris: Secondary | ICD-10-CM | POA: Diagnosis not present

## 2017-02-20 DIAGNOSIS — I255 Ischemic cardiomyopathy: Secondary | ICD-10-CM

## 2017-02-20 DIAGNOSIS — I471 Supraventricular tachycardia: Secondary | ICD-10-CM | POA: Diagnosis not present

## 2017-02-20 DIAGNOSIS — I10 Essential (primary) hypertension: Secondary | ICD-10-CM

## 2017-02-20 NOTE — Patient Instructions (Signed)
Medication Instructions:  Your physician recommends that you continue on your current medications as directed. Please refer to the Current Medication list given to you today.   Labwork: None  Testing/Procedures: None  Follow-Up: Your physician wants you to follow-up in: 6-9 months with Dr. Smith. You will receive a reminder letter in the mail two months in advance. If you don't receive a letter, please call our office to schedule the follow-up appointment.   Any Other Special Instructions Will Be Listed Below (If Applicable).     If you need a refill on your cardiac medications before your next appointment, please call your pharmacy.   

## 2017-02-20 NOTE — Progress Notes (Signed)
Cardiology Office Note    Date:  02/20/2017   ID:  Tommy [name], DOB 1950/09/17, MRN [id number]  PCP:  Patient, No Pcp Per  Cardiologist: Sinclair Grooms, MD   Chief Complaint  Patient presents with  . Coronary Artery Disease    History of Present Illness:  Tommy [name] is a 67 y.o. male with ischemic cardiomyopathy, exertional angina pectoris, prior coronary bypass grafting 2002 with bypass graft failure and multiple native and bypass graft stents most recently October 2018, CRT-D 2018, hypertension, hyperlipidemia and chronic systolic heart failure.   He is doing well.  He has no cardiac complaints.  Denies orthopnea, PND, AICD discharge, edema, orthopnea, and angina.  He is tolerating all of his medical therapy although he cannot take losartan until after he exercises each day.  And takes it prior to that time he gets hypotensive after exercise.   Past Medical History:  Diagnosis Date  . AICD (automatic cardioverter/defibrillator) present    BIV  . Anginal pain ([place])   . Blood transfusion without reported diagnosis   . CAD (coronary artery disease)    with CABG LIMA to LAD, free radial PDA, SVG to diagonal, SVG to OM. 2002. DES May 2011 and 01-2010 to SVG of Diag, native OM, and Native RCA. Unable to stent LAD via LIMA10/18 PCI to dRCA, multi-site DES to SVG--> OM  . GERD (gastroesophageal reflux disease)   . Hyperlipidemia    unable to tolarate statin therapy  . Hypertension   . Kidney stone   . Vertigo     Past Surgical History:  Procedure Laterality Date  . BIV ICD INSERTION CRT-D N/A 07/11/2016   Procedure: BiV ICD Insertion CRT-D;  Surgeon: Evans Lance, MD;  Location: [facility];  Service: Cardiovascular;  Laterality: N/A;  . CHOLECYSTECTOMY    . COLONOSCOPY    . CORONARY ARTERY BYPASS GRAFT    . CORONARY BALLOON ANGIOPLASTY N/A 10/12/2016   Procedure: CORONARY BALLOON ANGIOPLASTY;  Surgeon: Belva Crome, MD;  Location: [facility];   Service: Cardiovascular;  Laterality: N/A;  . CORONARY STENT INTERVENTION  10/12/2016   PTCA of focal in-stent restenosis in the second obtuse marginal reducing 90% stenosis to 0%.  . CORONARY STENT INTERVENTION N/A 10/12/2016   Procedure: CORONARY STENT INTERVENTION;  Surgeon: Belva Crome, MD;  Location: [facility];  Service: Cardiovascular;  Laterality: N/A;  . LEFT HEART CATH AND CORS/GRAFTS ANGIOGRAPHY N/A 10/12/2016   Procedure: LEFT HEART CATH AND CORS/GRAFTS ANGIOGRAPHY;  Surgeon: Belva Crome, MD;  Location: [facility];  Service: Cardiovascular;  Laterality: N/A;  . RIGHT/LEFT HEART CATH AND CORONARY/GRAFT ANGIOGRAPHY N/A 03/01/2016   Procedure: Right/Left Heart Cath and Coronary/Graft Angiography;  Surgeon: Belva Crome, MD;  Location: [facility];  Service: Cardiovascular;  Laterality: N/A;    Current Medications: Outpatient Medications Prior to Visit  Medication Sig Dispense Refill  . acetaminophen (TYLENOL) 325 MG tablet Take 1-2 tablets (325-650 mg total) by mouth every 4 (four) hours as needed for mild pain.    . Alirocumab (PRALUENT) 75 MG/ML SOPN Inject 1 pen into the skin every 14 (fourteen) days. 2 pen 11  . aspirin EC 81 MG tablet Take 81 mg by mouth daily.    Marland Kitchen atorvastatin (LIPITOR) 10 MG tablet TAKE ONE TABLET BY MOUTH EVERY DAY AT 6 PM. 90 tablet 3  . Cholecalciferol (VITAMIN D3) 5000 units CAPS Take 5,000 Units by mouth  daily.    . clopidogrel (PLAVIX) 75 MG tablet Take 75 mg by mouth daily.  3  . finasteride (PROPECIA) 1 MG tablet Take 1 mg by mouth daily.  0  . nitroGLYCERIN (NITROSTAT) 0.4 MG SL tablet Place 1 tablet (0.4 mg total) under the tongue every 5 (five) minutes as needed for chest pain (MAX 3 TABLETS). 25 tablet 3  . pantoprazole (PROTONIX) 40 MG tablet Take 1 tablet (40 mg total) by mouth daily. 30 tablet 2  . ranitidine (ZANTAC) 150 MG tablet Take 1 tablet (150 mg total) by mouth at bedtime. Only if needed.    . carvedilol  (COREG) 12.5 MG tablet Take 1 tablet (12.5 mg total) by mouth 2 (two) times daily. 180 tablet 3  . losartan (COZAAR) 50 MG tablet Take 1 tablet (50 mg total) by mouth daily. 90 tablet 3   No facility-administered medications prior to visit.      Allergies:   Fish oil   Social History   Socioeconomic History  . Marital status: Married    Spouse name: None  . Number of children: None  . Years of education: None  . Highest education level: None  Social Needs  . Financial resource strain: None  . Food insecurity - worry: None  . Food insecurity - inability: None  . Transportation needs - medical: None  . Transportation needs - non-medical: None  Occupational History  . None  Tobacco Use  . Smoking status: Never Smoker  . Smokeless tobacco: Never Used  Substance and Sexual Activity  . Alcohol use: Yes    Alcohol/week: 0.6 oz    Types: 1 Glasses of wine per week    Comment: RARE  . Drug use: No  . Sexual activity: None  Other Topics Concern  . None  Social History Narrative  . None     Family History:  The patient's family history includes Colon cancer in his father.   ROS:   Please see the history of present illness.    None  All other systems reviewed and are negative.   PHYSICAL EXAM:   VS:  BP 112/78   Pulse 75   Ht 6\' 1"  (1.854 m)   Wt 186 lb 1.9 oz (84.4 kg)   SpO2 95%   BMI 24.56 kg/m    GEN: Well nourished, well developed, in no acute distress  HEENT: normal  Neck: no JVD, carotid bruits, or masses Cardiac: RRR; no murmurs, rubs, or gallops,no edema  Respiratory:  clear to auscultation bilaterally, normal work of breathing GI: soft, nontender, nondistended, + BS MS: no deformity or atrophy  Skin: warm and dry, no rash Neuro:  Alert and Oriented x 3, Strength and sensation are intact Psych: euthymic mood, full affect  Wt Readings from Last 3 Encounters:  02/20/17 186 lb 1.9 oz (84.4 kg)  10/19/16 186 lb 3.2 oz (84.5 kg)  10/17/16 185 lb (83.9 kg)       Studies/Labs Reviewed:   EKG:  EKG  None  Recent Labs: 10/13/2016: BUN 16; Creatinine, Ser 0.88; Hemoglobin 13.0; Platelets 249; Potassium 4.0; Sodium 137 12/29/2016: ALT 29   Lipid Panel    Component Value Date/Time   CHOL 83 (L) 12/29/2016 1050   TRIG 131 12/29/2016 1050   HDL 46 12/29/2016 1050   CHOLHDL 1.8 12/29/2016 1050   CHOLHDL 3.3 03/03/2015 0744   VLDL 22 03/03/2015 0744   LDLCALC 11 12/29/2016 1050   LDLDIRECT 81 10/24/2016 1119   LDLDIRECT 91.0  02/05/2014 0809    Additional studies/ records that were reviewed today include:  None    ASSESSMENT:    1. Coronary artery disease involving coronary bypass graft of native heart with angina pectoris ([place])   2. Chronic systolic heart failure (H[facility])   3. Ectopic atrial tachycardia ([place])   4. HYPERTENSION, BENIGN SYSTEMIC   5. Mixed hyperlipidemia      PLAN:  In order of problems listed above:  1. Stable without angina currently.  No change in therapy. 2. No evidence of volume overload or symptoms to suggest active congestion. 3. No complaint. 4. Blood pressure is excellent.  No change. 5. Lipids are now excellent LDL less than 40.  Will discuss with our lipid clinic and determine if we need to continue statin therapy or not.  For the time being in given the severity of his atherosclerosis I would recommend leaving therapy ileus.  Current regimen is probably went 75 mg twice a month and atorvastatin 5 mg daily.  Clinical follow-up in 6-9 months.  Continue current to very level and diet.    Medication Adjustments/Labs and Tests Ordered: Current medicines are reviewed at length with the patient today.  Concerns regarding medicines are outlined above.  Medication changes, Labs and Tests ordered today are listed in the Patient Instructions below. Patient Instructions  Medication Instructions:  Your physician recommends that you continue on your current medications as directed. Please refer to the Current  Medication list given to you today.  Labwork: None  Testing/Procedures: None  Follow-Up: Your physician wants you to follow-up in: 6-9 months with Dr. Tamala Julian. You will receive a reminder letter in the mail two months in advance. If you don't receive a letter, please call our office to schedule the follow-up appointment.   Any Other Special Instructions Will Be Listed Below (If Applicable).     If you need a refill on your cardiac medications before your next appointment, please call your pharmacy.      Signed, Sinclair Grooms, MD  02/20/2017 8:50 AM    [facility] [place], [place], [place]  [address] Phone: [phone]; Fax: [phone]

## 2017-02-20 NOTE — Addendum Note (Signed)
Addended by: Loren Racer on: 02/20/2017 09:09 AM   Modules accepted: Orders

## 2017-02-20 NOTE — Telephone Encounter (Signed)
Patient assistance for Praluent approved for 2019 year. Patient is aware.

## 2017-03-13 ENCOUNTER — Telehealth: Payer: Self-pay | Admitting: Pharmacist

## 2017-03-13 MED ORDER — RABEPRAZOLE SODIUM 20 MG PO TBEC
20.0000 mg | DELAYED_RELEASE_TABLET | Freq: Every day | ORAL | 5 refills | Status: DC
Start: 2017-03-13 — End: 2017-04-16

## 2017-03-13 NOTE — Telephone Encounter (Signed)
Pt sent email to me inquiring about changing his PPI. He was switched from Prilosec to Protonix to avoid interaction with Plavix. He also takes Zantac 150mg  BID. He states his GERD symptoms have not been controlled since changing to Protonix. Nexium shares the same interaction with Plavix, however rabeprazole does not. Will send in rx for rabeprazole 20mg  daily and advised pt to monitor GERD sx.

## 2017-03-19 DIAGNOSIS — I10 Essential (primary) hypertension: Secondary | ICD-10-CM | POA: Diagnosis not present

## 2017-03-19 DIAGNOSIS — Z961 Presence of intraocular lens: Secondary | ICD-10-CM | POA: Diagnosis not present

## 2017-03-19 DIAGNOSIS — H26492 Other secondary cataract, left eye: Secondary | ICD-10-CM | POA: Diagnosis not present

## 2017-04-13 DIAGNOSIS — D229 Melanocytic nevi, unspecified: Secondary | ICD-10-CM | POA: Diagnosis not present

## 2017-04-13 DIAGNOSIS — L570 Actinic keratosis: Secondary | ICD-10-CM | POA: Diagnosis not present

## 2017-04-13 DIAGNOSIS — L309 Dermatitis, unspecified: Secondary | ICD-10-CM | POA: Diagnosis not present

## 2017-04-16 ENCOUNTER — Other Ambulatory Visit: Payer: Self-pay | Admitting: Interventional Cardiology

## 2017-04-16 MED ORDER — RABEPRAZOLE SODIUM 20 MG PO TBEC: 20.0000 mg | DELAYED_RELEASE_TABLET | Freq: Every day | ORAL | 5 refills | Status: DC

## 2017-04-16 NOTE — Telephone Encounter (Signed)
Pt's medication resent to [facility] on [address]. As requested from Fax sent. Confirmation received.

## 2017-04-17 ENCOUNTER — Ambulatory Visit (INDEPENDENT_AMBULATORY_CARE_PROVIDER_SITE_OTHER): Payer: Medicare Other | Admitting: *Deleted

## 2017-04-17 ENCOUNTER — Telehealth: Payer: Self-pay | Admitting: Interventional Cardiology

## 2017-04-17 DIAGNOSIS — I255 Ischemic cardiomyopathy: Secondary | ICD-10-CM

## 2017-04-17 MED ORDER — PANTOPRAZOLE SODIUM 40 MG PO TBEC: 40.0000 mg | DELAYED_RELEASE_TABLET | Freq: Every day | ORAL | 11 refills | Status: DC

## 2017-04-17 NOTE — Telephone Encounter (Signed)
Pt c/o medication issue:  1. Name of Medication:  RABEprazole (ACIPHEX) 20 MG tablet    2. How are you currently taking this medication (dosage and times per day)? Take 1 tablet (20 mg total) by mouth daily. 3. Are you having a reaction (difficulty breathing--STAT)? no 4. What is your medication issue? Pt verbalized that this medication is no available at the pharmacy and his insurance do not cover it

## 2017-04-17 NOTE — Telephone Encounter (Signed)
Protonix 40 mg/day.

## 2017-04-17 NOTE — Progress Notes (Signed)
Remote ICD transmission.   

## 2017-04-17 NOTE — Telephone Encounter (Signed)
Dr. Tamala Julian- Do you want to prescribe a different PPI?  Pt on Plavix so can't have Omeprazole or Nexium.  Pharmacy doesn't have Aciphex.

## 2017-04-17 NOTE — Telephone Encounter (Signed)
Spoke with pt and made him aware of medication change.  Pt verbalized understanding and was in agreement with this plan.

## 2017-04-19 ENCOUNTER — Encounter: Payer: Self-pay | Admitting: Cardiology

## 2017-04-23 DIAGNOSIS — N401 Enlarged prostate with lower urinary tract symptoms: Secondary | ICD-10-CM | POA: Diagnosis not present

## 2017-04-23 DIAGNOSIS — N138 Other obstructive and reflux uropathy: Secondary | ICD-10-CM | POA: Diagnosis not present

## 2017-05-14 LAB — CUP PACEART REMOTE DEVICE CHECK
Date Time Interrogation Session: 20190506154248
Implantable Lead Implant Date: 20180703
Implantable Lead Implant Date: 20180703
Implantable Lead Location: 753860
Implantable Lead Serial Number: 433305
Implantable Lead Serial Number: 901025
Implantable Pulse Generator Implant Date: 20180703
Lead Channel Setting Pacing Amplitude: 2 V
Lead Channel Setting Pacing Amplitude: 2 V
Lead Channel Setting Pacing Pulse Width: 0.4 ms
Lead Channel Setting Sensing Sensitivity: 1 mV
MDC IDC LEAD IMPLANT DT: 20180703
MDC IDC LEAD LOCATION: 753858
MDC IDC LEAD LOCATION: 753859
MDC IDC LEAD SERIAL: 801469
MDC IDC SET LEADCHNL LV PACING AMPLITUDE: 1.5 V
MDC IDC SET LEADCHNL RV PACING PULSEWIDTH: 0.4 ms
MDC IDC SET LEADCHNL RV SENSING SENSITIVITY: 0.6 mV
Pulse Gen Serial Number: 169965

## 2017-05-15 ENCOUNTER — Other Ambulatory Visit: Payer: Self-pay | Admitting: Interventional Cardiology

## 2017-06-14 DIAGNOSIS — J209 Acute bronchitis, unspecified: Secondary | ICD-10-CM | POA: Diagnosis not present

## 2017-07-17 ENCOUNTER — Ambulatory Visit (INDEPENDENT_AMBULATORY_CARE_PROVIDER_SITE_OTHER): Payer: Medicare Other | Admitting: *Deleted

## 2017-07-17 DIAGNOSIS — I255 Ischemic cardiomyopathy: Secondary | ICD-10-CM

## 2017-07-18 NOTE — Progress Notes (Signed)
Remote ICD transmission.   

## 2017-08-03 ENCOUNTER — Other Ambulatory Visit: Payer: Self-pay

## 2017-08-03 MED ORDER — LOSARTAN POTASSIUM 50 MG PO TABS: 50.0000 mg | ORAL_TABLET | Freq: Every day | ORAL | 1 refills | Status: DC

## 2017-08-03 MED ORDER — CARVEDILOL 12.5 MG PO TABS: 12.5000 mg | ORAL_TABLET | Freq: Two times a day (BID) | ORAL | 1 refills | Status: DC

## 2017-08-11 LAB — CUP PACEART REMOTE DEVICE CHECK
Implantable Lead Implant Date: 20180703
Implantable Lead Implant Date: 20180703
Implantable Lead Location: 753858
Implantable Lead Location: 753859
Implantable Lead Model: 293
Implantable Lead Model: 4674
Implantable Lead Model: 7741
Implantable Lead Serial Number: 433305
Implantable Lead Serial Number: 801469
MDC IDC LEAD IMPLANT DT: 20180703
MDC IDC LEAD LOCATION: 753860
MDC IDC LEAD SERIAL: 901025
MDC IDC PG IMPLANT DT: 20180703
MDC IDC SESS DTM: 20190803122350
Pulse Gen Serial Number: 169965

## 2017-09-04 ENCOUNTER — Telehealth: Payer: Self-pay | Admitting: *Deleted

## 2017-09-04 ENCOUNTER — Telehealth: Payer: Self-pay | Admitting: Interventional Cardiology

## 2017-09-04 DIAGNOSIS — I25709 Atherosclerosis of coronary artery bypass graft(s), unspecified, with unspecified angina pectoris: Secondary | ICD-10-CM

## 2017-09-04 DIAGNOSIS — K219 Gastro-esophageal reflux disease without esophagitis: Secondary | ICD-10-CM

## 2017-09-04 NOTE — Telephone Encounter (Signed)
New Message ° ° ° ° ° ° ° ° ° °Patient returned your call °

## 2017-09-04 NOTE — Telephone Encounter (Signed)
Pt calling about sleep study.  Was ordered last September but pt has been unable to move forward with it d/t surgeries and other appts.  Pt would like to schedule now.  Advised I will send message to sleep team and have them contact pt to schedule.  Pt appreciative for call.

## 2017-09-04 NOTE — Telephone Encounter (Signed)
-----   Message from Loren Racer, LPN sent at 2/92/9090 11:52 AM EDT ----- Stark Bray,  Sleep study ordered for pt last September.  He has been unable to move forward with it due to surgeries and other things.  He states he can move forward with it now and would like to get it scheduled.  The order is still in there.  Can you reach out to him to get him scheduled please?  Let me know if I need to place a new order.  Thanks [employer]

## 2017-09-05 NOTE — Telephone Encounter (Signed)
Patient is scheduled for lab study on 10/01/17. Patient understands his sleep study will be done at [facility] sleep lab. Patient understands he will receive a sleep packet in a week or so. Patient understands to call if he does not receive the sleep packet in a timely manner. Patient agrees with treatment and thanked me for call.

## 2017-09-05 NOTE — Addendum Note (Signed)
Addended by: Freada Bergeron on: 09/05/2017 10:49 AM   Modules accepted: Orders

## 2017-09-12 ENCOUNTER — Ambulatory Visit (INDEPENDENT_AMBULATORY_CARE_PROVIDER_SITE_OTHER): Payer: Medicare Other | Admitting: Internal Medicine

## 2017-09-12 ENCOUNTER — Encounter: Payer: Self-pay | Admitting: Internal Medicine

## 2017-09-12 VITALS — BP 140/96 | HR 65 | Ht 73.0 in | Wt 196.2 lb

## 2017-09-12 DIAGNOSIS — I255 Ischemic cardiomyopathy: Secondary | ICD-10-CM

## 2017-09-12 DIAGNOSIS — Z95810 Presence of automatic (implantable) cardiac defibrillator: Secondary | ICD-10-CM | POA: Diagnosis not present

## 2017-09-12 DIAGNOSIS — I5022 Chronic systolic (congestive) heart failure: Secondary | ICD-10-CM | POA: Diagnosis not present

## 2017-09-12 NOTE — Patient Instructions (Signed)
Medication Instructions:  Your physician recommends that you continue on your current medications as directed. Please refer to the Current Medication list given to you today.  Labwork: None ordered.  Testing/Procedures: None ordered.  Follow-Up: Your physician wants you to follow-up in: one year with Dr. Lovena Le.   You will receive a reminder letter in the mail two months in advance. If you don't receive a letter, please call our office to schedule the follow-up appointment.  Remote monitoring is used to monitor your ICD from home. This monitoring reduces the number of office visits required to check your device to one time per year. It allows [place] to keep an eye on the functioning of your device to ensure it is working properly. You are scheduled for a device check from home on 10/16/2017. You may send your transmission at any time that day. If you have a wireless device, the transmission will be sent automatically. After your physician reviews your transmission, you will receive a postcard with your next transmission date.  Any Other Special Instructions Will Be Listed Below (If Applicable).  If you need a refill on your cardiac medications before your next appointment, please call your pharmacy.

## 2017-09-12 NOTE — Progress Notes (Signed)
HPI [name] returns today for followup. He has extensive CAD, s/p remote MI, severe LV dysfunction, LBBB, s/p biv ICD insertion. He has done well in the interim. No chest pain or wosening sob. No ICD shocks. No palpitations. Allergies  Allergen Reactions  . Fish Oil Other (See Comments)    FEELS BAD      Current Outpatient Medications  Medication Sig Dispense Refill  . acetaminophen (TYLENOL) 325 MG tablet Take 1-2 tablets (325-650 mg total) by mouth every 4 (four) hours as needed for mild pain.    . Alirocumab (PRALUENT) 75 MG/ML SOPN Inject 1 pen into the skin every 14 (fourteen) days. 2 pen 11  . aspirin EC 81 MG tablet Take 81 mg by mouth daily.    Marland Kitchen atorvastatin (LIPITOR) 10 MG tablet TAKE ONE TABLET BY MOUTH EVERY DAY AT 6 PM. 90 tablet 3  . carvedilol (COREG) 12.5 MG tablet Take 1 tablet (12.5 mg total) by mouth 2 (two) times daily. Please keep upcoming appt for future refills. Thank you 180 tablet 1  . Cholecalciferol (VITAMIN D3) 5000 units CAPS Take 5,000 Units by mouth daily.    . clopidogrel (PLAVIX) 75 MG tablet TAKE 1 TABLET BY MOUTH EVERY DAY WITH A MEAL 90 tablet 2  . finasteride (PROPECIA) 1 MG tablet Take 1 mg by mouth daily.  0  . losartan (COZAAR) 50 MG tablet Take 1 tablet (50 mg total) by mouth daily. Please keep upcoming appt for future refills. Thank you 90 tablet 1  . nitroGLYCERIN (NITROSTAT) 0.4 MG SL tablet Place 1 tablet (0.4 mg total) under the tongue every 5 (five) minutes as needed for chest pain (MAX 3 TABLETS). 25 tablet 3  . RABEprazole (ACIPHEX) 20 MG tablet Take 20 mg by mouth daily.  4  . ranitidine (ZANTAC) 150 MG tablet Take 1 tablet (150 mg total) by mouth at bedtime. Only if needed.     No current facility-administered medications for this visit.      Past Medical History:  Diagnosis Date  . AICD (automatic cardioverter/defibrillator) present    BIV  . Anginal pain ([place])   . Blood transfusion without reported diagnosis   . CAD  (coronary artery disease)    with CABG LIMA to LAD, free radial PDA, SVG to diagonal, SVG to OM. 2002. DES May 2011 and 01-2010 to SVG of Diag, native OM, and Native RCA. Unable to stent LAD via LIMA10/18 PCI to dRCA, multi-site DES to SVG--> OM  . GERD (gastroesophageal reflux disease)   . Hyperlipidemia    unable to tolarate statin therapy  . Hypertension   . Kidney stone   . Vertigo     ROS:   All systems reviewed and negative except as noted in the HPI.   Past Surgical History:  Procedure Laterality Date  . BIV ICD INSERTION CRT-D N/A 07/11/2016   Procedure: BiV ICD Insertion CRT-D;  Surgeon: Evans Lance, MD;  Location: [facility];  Service: Cardiovascular;  Laterality: N/A;  . CHOLECYSTECTOMY    . COLONOSCOPY    . CORONARY ARTERY BYPASS GRAFT    . CORONARY BALLOON ANGIOPLASTY N/A 10/12/2016   Procedure: CORONARY BALLOON ANGIOPLASTY;  Surgeon: Belva Crome, MD;  Location: [facility];  Service: Cardiovascular;  Laterality: N/A;  . CORONARY STENT INTERVENTION  10/12/2016   PTCA of focal in-stent restenosis in the second obtuse marginal reducing 90% stenosis to 0%.  . CORONARY STENT INTERVENTION N/A 10/12/2016  Procedure: CORONARY STENT INTERVENTION;  Surgeon: Belva Crome, MD;  Location: [facility];  Service: Cardiovascular;  Laterality: N/A;  . LEFT HEART CATH AND CORS/GRAFTS ANGIOGRAPHY N/A 10/12/2016   Procedure: LEFT HEART CATH AND CORS/GRAFTS ANGIOGRAPHY;  Surgeon: Belva Crome, MD;  Location: [facility];  Service: Cardiovascular;  Laterality: N/A;  . RIGHT/LEFT HEART CATH AND CORONARY/GRAFT ANGIOGRAPHY N/A 03/01/2016   Procedure: Right/Left Heart Cath and Coronary/Graft Angiography;  Surgeon: Belva Crome, MD;  Location: [facility];  Service: Cardiovascular;  Laterality: N/A;     Family History  Problem Relation Age of Onset  . Colon cancer Father   . Sudden death Neg Hx   . Hypertension Neg Hx   . Hyperlipidemia Neg Hx     . Heart attack Neg Hx   . Diabetes Neg Hx   . Rectal cancer Neg Hx   . Stomach cancer Neg Hx      Social History   Socioeconomic History  . Marital status: Married    Spouse name: Not on file  . Number of children: Not on file  . Years of education: Not on file  . Highest education level: Not on file  Occupational History  . Not on file  Social Needs  . Financial resource strain: Not on file  . Food insecurity:    Worry: Not on file    Inability: Not on file  . Transportation needs:    Medical: Not on file    Non-medical: Not on file  Tobacco Use  . Smoking status: Never Smoker  . Smokeless tobacco: Never Used  Substance and Sexual Activity  . Alcohol use: Yes    Alcohol/week: 1.0 standard drinks    Types: 1 Glasses of wine per week    Comment: RARE  . Drug use: No  . Sexual activity: Not on file  Lifestyle  . Physical activity:    Days per week: Not on file    Minutes per session: Not on file  . Stress: Not on file  Relationships  . Social connections:    Talks on phone: Not on file    Gets together: Not on file    Attends religious service: Not on file    Active member of club or organization: Not on file    Attends meetings of clubs or organizations: Not on file    Relationship status: Not on file  . Intimate partner violence:    Fear of current or ex partner: Not on file    Emotionally abused: Not on file    Physically abused: Not on file    Forced sexual activity: Not on file  Other Topics Concern  . Not on file  Social History Narrative  . Not on file     BP (!) 140/96   Pulse 65   Ht 6\' 1"  (1.854 m)   Wt 196 lb 3.2 oz (89 kg)   SpO2 97%   BMI 25.89 kg/m   Physical Exam:  Well appearing 67 yo man, NAD HEENT: Unremarkable Neck:  No JVD, no thyromegally Lymphatics:  No adenopathy Back:  No CVA tenderness Lungs:  Clear with no wheezes HEART:  Regular rate rhythm, no murmurs, no rubs, no clicks Abd:  soft, positive bowel sounds, no  organomegally, no rebound, no guarding Ext:  2 plus pulses, no edema, no cyanosis, no clubbing Skin:  No rashes no nodules Neuro:  CN II through XII intact, motor grossly intact  EKG -  none  DEVICE  Normal device function.  See PaceArt for details.   Assess/Plan: 1. CAD - he remains active exercising almost every day and he has had not had any angina. 2. PAF - he has only a few minutes of atrial fib at a time. He is asymptomatic. I have recommended watchful waiting. He does not have enough atrial fib to recommend a NOAC but will likely in the future. I will need to discuss with Dr. Linard Millers who sees him as his general cardiologist. 3. Dyslipidemia - he has been intolerant to statins but has been able to tolerate his Alirocumab.  4. ICD - his [place] ICD is working normally. We will recheck in several months.   Mikle Bosworth.D.

## 2017-10-01 ENCOUNTER — Ambulatory Visit (HOSPITAL_BASED_OUTPATIENT_CLINIC_OR_DEPARTMENT_OTHER): Payer: Medicare Other | Attending: Cardiology | Admitting: Cardiology

## 2017-10-01 VITALS — Ht 72.0 in | Wt 185.0 lb

## 2017-10-01 DIAGNOSIS — I25709 Atherosclerosis of coronary artery bypass graft(s), unspecified, with unspecified angina pectoris: Secondary | ICD-10-CM | POA: Diagnosis not present

## 2017-10-01 DIAGNOSIS — I493 Ventricular premature depolarization: Secondary | ICD-10-CM | POA: Insufficient documentation

## 2017-10-01 DIAGNOSIS — K219 Gastro-esophageal reflux disease without esophagitis: Secondary | ICD-10-CM | POA: Diagnosis not present

## 2017-10-01 DIAGNOSIS — G4733 Obstructive sleep apnea (adult) (pediatric): Secondary | ICD-10-CM | POA: Diagnosis not present

## 2017-10-01 DIAGNOSIS — N400 Enlarged prostate without lower urinary tract symptoms: Secondary | ICD-10-CM | POA: Diagnosis not present

## 2017-10-04 NOTE — Procedures (Signed)
Patient Name: Tommy [name], [place] Date: 10/01/2017   Gender: Male  D.O.B: 04/16/1950  Age (years): 64  Referring Provider: Fransico Him MD, ABSM  Height (inches): 72  Interpreting Physician: Fransico Him MD, ABSM  Weight (lbs): 185  RPSGT: Laren Everts  BMI: 25  MRN: [id number]  Neck Size: 15.50   CLINICAL INFORMATION  Sleep Study Type: Split Night CPAP Indication for sleep study: Congestive Heart Failure, Fatigue, OSA, Sleep walking/talking/parasomnias, Snoring, Witnessed Apneas Epworth Sleepiness Score: 8  SLEEP STUDY TECHNIQUE  As per the AASM Manual for the Scoring of Sleep and Associated Events v2.3 (April 2016) with a hypopnea requiring 4% desaturations. The channels recorded and monitored were frontal, central and occipital EEG, electrooculogram (EOG), submentalis EMG (chin), nasal and oral airflow, thoracic and abdominal wall motion, anterior tibialis EMG, snore microphone, electrocardiogram, and pulse oximetry. Continuous positive airway pressure (CPAP) was initiated when the patient met split night criteria and was titrated according to treat sleep-disordered breathing.  MEDICATIONS  Medications self-administered by patient taken the night of the study : N/A  RESPIRATORY PARAMETERS  Diagnostic Total AHI (/hr): 30.3 RDI (/hr): 42.0 OA Index (/hr): 19.4 CA Index (/hr): 0.0  REM AHI (/hr): 13.8 NREM AHI (/hr): 36.2 Supine AHI (/hr): N/A Non-supine AHI (/hr): 30.3  Min O2 Sat (%): 83.0 Mean O2 (%): 96.1 Time below 88% (min): 2.5      Titration Optimal Pressure (cm): 8 AHI at Optimal Pressure (/hr): 0.7 Min O2 at Optimal Pressure (%): 94.0  Supine % at Optimal (%): 0 Sleep % at Optimal (%): 99       SLEEP ARCHITECTURE  The recording time for the entire night was 426.7 minutes. During a baseline period of 184.5 minutes, the patient slept for 148.5 minutes in REM and nonREM, yielding a sleep efficiency of 80.5%%. Sleep onset after lights out was 18.6 minutes with  a REM latency of 83.5 minutes. The patient spent 19.9%% of the night in stage N1 sleep, 53.9%% in stage N2 sleep, 0.0%% in stage N3 and 26.3% in REM. During the titration period of 237.3 minutes, the patient slept for 174.5 minutes in REM and nonREM, yielding a sleep efficiency of 73.5%%. Sleep onset after CPAP initiation was 29.2 minutes with a REM latency of 52.0 minutes. The patient spent 3.4%% of the night in stage N1 sleep, 46.4%% in stage N2 sleep, 0.0%% in stage N3 and 50.1% in REM.  CARDIAC DATA  The 2 lead EKG demonstrated sinus rhythm, pacemaker generated. The mean heart rate was 100.0 beats per minute. Other EKG findings include: PVCs.   LEG MOVEMENT DATA  The total Periodic Limb Movements of Sleep (PLMS) were 0. The PLMS index was 0.0 .  IMPRESSIONS  Severe obstructive sleep apnea occurred during the diagnostic portion of the study (AHI = 30.3/hour). An optimal PAP pressure was selected for this patient ( 8 cm of water)  No significant central sleep apnea occurred during the diagnostic portion of the study (CAI = 0.0/hour).  The patient had minimal or no oxygen desaturation during the diagnostic portion of the study (Min O2 = 83.0%)  The patient snored with loud snoring volume during the diagnostic portion of the study.  EKG findings include PVCs.  Clinically significant periodic limb movements did not occur during sleep.  DIAGNOSIS  Obstructive Sleep Apnea (327.23 [G47.33 ICD-10])  RECOMMENDATIONS  Trial of CPAP therapy on 8 cm H2O with a Large size Fisher&Paykel Full Face Mask Simplus mask and heated humidification. Avoid alcohol, sedatives  and other CNS depressants that may worsen sleep apnea and disrupt normal sleep architecture.  Sleep hygiene should be reviewed to assess factors that may improve sleep quality.  Weight management and regular exercise should be initiated or continued.  Return to Sleep Center for re-evaluation after 10 weeks of therapy  [Electronically  signed] 10/04/2017 04:49 PM Fransico Him MD, ABSM  Diplomate, American Board of Sleep Medicine

## 2017-10-04 NOTE — Progress Notes (Signed)
Cardiology Office Note:    Date:  10/05/2017   ID:  Tommy [name], DOB [birth year], MRN [id number]  PCP:  Patient, No Pcp Per  Cardiologist:  No primary care provider on file.   Referring MD: No ref. provider found   Chief Complaint  Patient presents with  . Atrial Fibrillation  . Coronary Artery Disease    History of Present Illness:    Tommy [name] is a 67 y.o. male with a hx of ischemic cardiomyopathy, exertional angina pectoris, prior coronary bypass grafting 2002 with bypass graft failure and multiple native and bypass graft stents most recently October 2018, CRT-D 2018, hypertension, hyperlipidemia and chronic systolic heart failure.   Symmetry on AICD has demonstrated brief atrial fibrillation.  Recent diagnosis of severe sleep apnea not yet being treated.  Ras is doing relatively well.  On his visit with Dr. Lovena Le they discussed brief episodes of atrial fibrillation for which Dr. Lovena Le has not yet recommend anticoagulation therapy because of the brevity of the episodes.  He was totally asymptomatic and had no idea about this.  The CHADS VASC score is elevated enough to require anticoagulation therapy if this becomes more prevalent.  The recent identification of severe sleep apnea may impact the volume of atrial fibrillation once treatment is started.  No angina pectoris.  Denies orthopnea.  Unable to lie on his back because he cannot exhale.  He has had difficulty with snoring and sleeplessness his entire life.  States his family told him as a child that he snored.  Still exercising including biking up to 20 miles twice per week, doubles tennis, and weekend running.  He needs to be on Entresto.  He refused earlier this year because of cost.  We discussed the importance of switching over to [facility] because of the benefits/protection.  He will make sure his next medication supplementary plan includes [name] is a therapy that would make it possible for him to use the  agent.   Past Medical History:  Diagnosis Date  . AICD (automatic cardioverter/defibrillator) present    BIV  . Anginal pain ([place])   . Blood transfusion without reported diagnosis   . CAD (coronary artery disease)    with CABG LIMA to LAD, free radial PDA, SVG to diagonal, SVG to OM. 2002. DES May 2011 and 01-2010 to SVG of Diag, native OM, and Native RCA. Unable to stent LAD via LIMA10/18 PCI to dRCA, multi-site DES to SVG--> OM  . GERD (gastroesophageal reflux disease)   . Hyperlipidemia    unable to tolarate statin therapy  . Hypertension   . Kidney stone   . Vertigo     Past Surgical History:  Procedure Laterality Date  . BIV ICD INSERTION CRT-D N/A 07/11/2016   Procedure: BiV ICD Insertion CRT-D;  Surgeon: Evans Lance, MD;  Location: [place] [facility];  Service: Cardiovascular;  Laterality: N/A;  . CHOLECYSTECTOMY    . COLONOSCOPY    . CORONARY ARTERY BYPASS GRAFT    . CORONARY BALLOON ANGIOPLASTY N/A 10/12/2016   Procedure: CORONARY BALLOON ANGIOPLASTY;  Surgeon: Belva Crome, MD;  Location: [place] [facility];  Service: Cardiovascular;  Laterality: N/A;  . CORONARY STENT INTERVENTION  10/12/2016   PTCA of focal in-stent restenosis in the second obtuse marginal reducing 90% stenosis to 0%.  . CORONARY STENT INTERVENTION N/A 10/12/2016   Procedure: CORONARY STENT INTERVENTION;  Surgeon: Belva Crome, MD;  Location: [place] [facility];  Service: Cardiovascular;  Laterality: N/A;  .  LEFT HEART CATH AND CORS/GRAFTS ANGIOGRAPHY N/A 10/12/2016   Procedure: LEFT HEART CATH AND CORS/GRAFTS ANGIOGRAPHY;  Surgeon: Belva Crome, MD;  Location: [place] [facility];  Service: Cardiovascular;  Laterality: N/A;  . RIGHT/LEFT HEART CATH AND CORONARY/GRAFT ANGIOGRAPHY N/A 03/01/2016   Procedure: Right/Left Heart Cath and Coronary/Graft Angiography;  Surgeon: Belva Crome, MD;  Location: [place] [facility];  Service: Cardiovascular;  Laterality: N/A;    Current Medications: Current  Meds  Medication Sig  . acetaminophen (TYLENOL) 325 MG tablet Take 1-2 tablets (325-650 mg total) by mouth every 4 (four) hours as needed for mild pain.  . Alirocumab (PRALUENT) 75 MG/ML SOPN Inject 1 pen into the skin every 14 (fourteen) days.  Marland Kitchen aspirin EC 81 MG tablet Take 81 mg by mouth daily.  Marland Kitchen atorvastatin (LIPITOR) 10 MG tablet TAKE ONE TABLET BY MOUTH EVERY DAY AT 6 PM.  . carvedilol (COREG) 12.5 MG tablet Take 1 tablet (12.5 mg total) by mouth 2 (two) times daily. Please keep upcoming appt for future refills. Thank you  . Cholecalciferol (VITAMIN D3) 5000 units CAPS Take 5,000 Units by mouth daily.  . clopidogrel (PLAVIX) 75 MG tablet TAKE 1 TABLET BY MOUTH EVERY DAY WITH A MEAL  . finasteride (PROPECIA) 1 MG tablet Take 1 mg by mouth daily.  Marland Kitchen losartan (COZAAR) 50 MG tablet Take 1 tablet (50 mg total) by mouth daily. Please keep upcoming appt for future refills. Thank you  . nitroGLYCERIN (NITROSTAT) 0.4 MG SL tablet Place 1 tablet (0.4 mg total) under the tongue every 5 (five) minutes as needed for chest pain (MAX 3 TABLETS).  . [DISCONTINUED] RABEprazole (ACIPHEX) 20 MG tablet Take 20 mg by mouth daily.  . [DISCONTINUED] ranitidine (ZANTAC) 150 MG tablet Take 1 tablet (150 mg total) by mouth at bedtime. Only if needed.     Allergies:   Fish oil   Social History   Socioeconomic History  . Marital status: Married    Spouse name: Not on file  . Number of children: Not on file  . Years of education: Not on file  . Highest education level: Not on file  Occupational History  . Not on file  Social Needs  . Financial resource strain: Not on file  . Food insecurity:    Worry: Not on file    Inability: Not on file  . Transportation needs:    Medical: Not on file    Non-medical: Not on file  Tobacco Use  . Smoking status: Never Smoker  . Smokeless tobacco: Never Used  Substance and Sexual Activity  . Alcohol use: Yes    Alcohol/week: 1.0 standard drinks    Types: 1 Glasses  of wine per week    Comment: RARE  . Drug use: No  . Sexual activity: Not on file  Lifestyle  . Physical activity:    Days per week: Not on file    Minutes per session: Not on file  . Stress: Not on file  Relationships  . Social connections:    Talks on phone: Not on file    Gets together: Not on file    Attends religious service: Not on file    Active member of club or organization: Not on file    Attends meetings of clubs or organizations: Not on file    Relationship status: Not on file  Other Topics Concern  . Not on file  Social History Narrative  . Not on file  Family History: The patient's family history includes Colon cancer in his father. There is no history of Sudden death, Hypertension, Hyperlipidemia, Heart attack, Diabetes, Rectal cancer, or Stomach cancer.  ROS:   Please see the history of present illness.    During, sleeplessness, having significant reflux and AcipHex is not helping.  We will switch back to omeprazole now that he is greater than a year out from recent stenting.  All other systems reviewed and are negative.  EKGs/Labs/Other Studies Reviewed:    The following studies were reviewed today: Sleep study 09/2017: IMPRESSIONS   Severe obstructive sleep apnea occurred during the diagnostic portion of the study (AHI = 30.3/hour). An optimal PAP pressure was selected for this patient ( 8 cm of water)   No significant central sleep apnea occurred during the diagnostic portion of the study (CAI = 0.0/hour).   The patient had minimal or no oxygen desaturation during the diagnostic portion of the study (Min O2 = 83.0%)   The patient snored with loud snoring volume during the diagnostic portion of the study.   EKG findings include PVCs.   Clinically significant periodic limb movements did not occur during sleep.   2D Doppler echocardiogram December 2018: Study Conclusions   - Left ventricle: The cavity size was mildly dilated. Wall   thickness was  increased in a pattern of mild LVH. There was mild   focal basal hypertrophy of the septum. Systolic function was   severely reduced. The estimated ejection fraction was in the   range of 20% to 25%. Diffuse hypokinesis. Features are consistent   with a pseudonormal left ventricular filling pattern, with   concomitant abnormal relaxation and increased filling pressure   (grade 2 diastolic dysfunction). - Mitral valve: There was moderate regurgitation. - Left atrium: The atrium was moderately dilated. - Right ventricle: Systolic function was mildly reduced. - Pulmonary arteries: Systolic pressure was mildly increased. PA   peak pressure: 37 mm Hg (S).   Impressions:   - Severe global reduction in LV systolic function; mild LVE; mild   LVH; grade 2 diastolic dysfunction; moderate MR; moderate LAE;   mildly reduced RV function.; mild TR; mildly elevated pulmonary   pressure.   EKG:  EKG is not ordered today.  The ekg performed performed 09/12/2017 reveals atrial tracking with ventricular pacing.  Recent Labs: 10/13/2016: BUN 16; Creatinine, Ser 0.88; Hemoglobin 13.0; Platelets 249; Potassium 4.0; Sodium 137 12/29/2016: ALT 29  Recent Lipid Panel    Component Value Date/Time   CHOL 83 (L) 12/29/2016 1050   TRIG 131 12/29/2016 1050   HDL 46 12/29/2016 1050   CHOLHDL 1.8 12/29/2016 1050   CHOLHDL 3.3 03/03/2015 0744   VLDL 22 03/03/2015 0744   LDLCALC 11 12/29/2016 1050   LDLDIRECT 81 10/24/2016 1119   LDLDIRECT 91.0 02/05/2014 0809    Physical Exam:    VS:  BP 130/82   Pulse 89   Ht 6' (1.829 m)   Wt 188 lb 6.4 oz (85.5 kg)   BMI 25.55 kg/m     Wt Readings from Last 3 Encounters:  10/05/17 188 lb 6.4 oz (85.5 kg)  10/01/17 185 lb (83.9 kg)  09/12/17 196 lb 3.2 oz (89 kg)     GEN: Well nourished, well developed in no acute distress HEENT: Normal NECK: No JVD. LYMPHATICS: No lymphadenopathy CARDIAC: RRR, no murmur, no gallop, no edema. VASCULAR: 2+ bilateral radial  pulses.  No bruits. RESPIRATORY:  Clear to auscultation without rales, wheezing or rhonchi  ABDOMEN: Soft, non-tender, non-distended, No pulsatile mass, MUSCULOSKELETAL: No deformity  SKIN: Warm and dry NEUROLOGIC:  Alert and oriented x 3 PSYCHIATRIC:  Normal affect   ASSESSMENT:    1. Coronary artery disease involving coronary bypass graft of native heart with angina pectoris ([place])   2. HYPERTENSION, BENIGN SYSTEMIC   3. Ectopic atrial tachycardia ([place])   4. Chronic systolic heart failure ([place])   5. Mixed hyperlipidemia   6. Paroxysmal atrial fibrillation ([facility])   7. Severe obstructive sleep apnea    PLAN:    In order of problems listed above:  1. Stable without angina.  Last PCI greater than 10 months ago.  Okay to return to omeprazole therapy for reflux.  Continue aspirin and Plavix. 2. Blood pressures at target of 130/80 mmHg. 3. Current note from Dr. Lovena Le suggest the possibility of atrial fibrillation.  He is clearly had ectopic atrial tachycardia in the past.  Whether these are the same rhythm abnormalities and not I am unsure.  The recent identification of severe sleep apnea which is not yet being treated may help decrease recurrences.  No anticoagulation therapy at this point.  The CHADS VASC is > 2.  Will need chronic anticoagulation therapy if quantity of atrial arrhythmia increases according to Dr. Lovena Le. 4. Needs to be on Entresto.  He refused earlier this year because of cost.  I have asked him to be sure his medication plan includes Entresto at a tier that he cannot afford.  We should switch him over to [facility] in place of losartan as soon as financially possible. 5. LDL target less than 70.  Most recent was 79.  Statin therapy was discontinued.  Liver and lipid panel needs to be done soon. 6. Continue to monitor on telemetry from device. 7. Has not yet started therapy for sleep apnea.  This is been present for years.  Hopefully management will improve quality of life and  may end up helping LV function as they will remove stress sores that have led to progressive cardiovascular events.  Clinical follow-up in 9 to 12 months.  2D Doppler echocardiogram in 3 to 4 months after starting CPAP therapy.  Continue to monitor for recurrence of atrial fibrillation.  Will use telemetry from device to help [place] gauge whether or not to switch over to anticoagulation therapy.  I have decided against that at this particular time.  Will discuss with Dr. Lovena Le.  Lipid panel to determine if any adjustments in therapy is needed.   Medication Adjustments/Labs and Tests Ordered: Current medicines are reviewed at length with the patient today.  Concerns regarding medicines are outlined above.  Orders Placed This Encounter  Procedures  . Lipid Profile  . Hepatic function panel  . ECHOCARDIOGRAM COMPLETE   Meds ordered this encounter  Medications  . omeprazole (PRILOSEC) 40 MG capsule    Sig: Take 1 capsule (40 mg total) by mouth daily.    Dispense:  90 capsule    Refill:  3    We are aware pt is on Plavix.  Ok to switch back to Omeprazole.    Patient Instructions  Medication Instructions:  1) DISCONTINUE Aciphex 2) START Omeprazole 40mg  once daily  Labwork: Your physician recommends that you return for lab work when you are fasting (Lipid, liver)   Testing/Procedures: Your physician has requested that you have an echocardiogram in 4 months. Echocardiography is a painless test that uses sound waves to create images of your heart. It provides your doctor with information  about the size and shape of your heart and how well your heart's chambers and valves are working. This procedure takes approximately one hour. There are no restrictions for this procedure.    Follow-Up: Your physician wants you to follow-up in: 9-12 months with Dr. Tamala Julian.  You will receive a reminder letter in the mail two months in advance. If you don't receive a letter, please call our office to schedule  the follow-up appointment.   Any Other Special Instructions Will Be Listed Below (If Applicable).     If you need a refill on your cardiac medications before your next appointment, please call your pharmacy.      Signed, Sinclair Grooms, MD  10/05/2017 9:55 AM    [facility]

## 2017-10-05 ENCOUNTER — Encounter: Payer: Self-pay | Admitting: Interventional Cardiology

## 2017-10-05 ENCOUNTER — Ambulatory Visit (INDEPENDENT_AMBULATORY_CARE_PROVIDER_SITE_OTHER): Payer: Medicare Other | Admitting: Interventional Cardiology

## 2017-10-05 VITALS — BP 130/82 | HR 89 | Ht 72.0 in | Wt 188.4 lb

## 2017-10-05 DIAGNOSIS — I471 Supraventricular tachycardia: Secondary | ICD-10-CM | POA: Diagnosis not present

## 2017-10-05 DIAGNOSIS — I255 Ischemic cardiomyopathy: Secondary | ICD-10-CM

## 2017-10-05 DIAGNOSIS — I10 Essential (primary) hypertension: Secondary | ICD-10-CM

## 2017-10-05 DIAGNOSIS — I25709 Atherosclerosis of coronary artery bypass graft(s), unspecified, with unspecified angina pectoris: Secondary | ICD-10-CM | POA: Diagnosis not present

## 2017-10-05 DIAGNOSIS — I480 Paroxysmal atrial fibrillation: Secondary | ICD-10-CM | POA: Diagnosis not present

## 2017-10-05 DIAGNOSIS — G4733 Obstructive sleep apnea (adult) (pediatric): Secondary | ICD-10-CM | POA: Diagnosis not present

## 2017-10-05 DIAGNOSIS — E782 Mixed hyperlipidemia: Secondary | ICD-10-CM

## 2017-10-05 DIAGNOSIS — I5022 Chronic systolic (congestive) heart failure: Secondary | ICD-10-CM

## 2017-10-05 MED ORDER — OMEPRAZOLE 40 MG PO CPDR: 40.0000 mg | DELAYED_RELEASE_CAPSULE | Freq: Every day | ORAL | 3 refills | Status: DC

## 2017-10-05 NOTE — Patient Instructions (Addendum)
Medication Instructions:  1) DISCONTINUE Aciphex 2) START Omeprazole 40mg  once daily  Labwork: Your physician recommends that you return for lab work when you are fasting (Lipid, liver)   Testing/Procedures: Your physician has requested that you have an echocardiogram in 4 months. Echocardiography is a painless test that uses sound waves to create images of your heart. It provides your doctor with information about the size and shape of your heart and how well your heart's chambers and valves are working. This procedure takes approximately one hour. There are no restrictions for this procedure.    Follow-Up: Your physician wants you to follow-up in: 9-12 months with Dr. Tamala Julian.  You will receive a reminder letter in the mail two months in advance. If you don't receive a letter, please call our office to schedule the follow-up appointment.   Any Other Special Instructions Will Be Listed Below (If Applicable).     If you need a refill on your cardiac medications before your next appointment, please call your pharmacy.

## 2017-10-09 ENCOUNTER — Other Ambulatory Visit: Payer: Medicare Other

## 2017-10-09 ENCOUNTER — Telehealth: Payer: Self-pay | Admitting: *Deleted

## 2017-10-09 DIAGNOSIS — I25709 Atherosclerosis of coronary artery bypass graft(s), unspecified, with unspecified angina pectoris: Secondary | ICD-10-CM | POA: Diagnosis not present

## 2017-10-09 DIAGNOSIS — E782 Mixed hyperlipidemia: Secondary | ICD-10-CM

## 2017-10-09 LAB — HEPATIC FUNCTION PANEL
ALK PHOS: 84 IU/L (ref 39–117)
ALT: 32 IU/L (ref 0–44)
AST: 24 IU/L (ref 0–40)
Albumin: 4.6 g/dL (ref 3.6–4.8)
Bilirubin Total: 0.5 mg/dL (ref 0.0–1.2)
Bilirubin, Direct: 0.16 mg/dL (ref 0.00–0.40)
Total Protein: 6.9 g/dL (ref 6.0–8.5)

## 2017-10-09 LAB — LIPID PANEL
CHOL/HDL RATIO: 2.7 ratio (ref 0.0–5.0)
Cholesterol, Total: 107 mg/dL (ref 100–199)
HDL: 40 mg/dL (ref >39)
LDL CALC: 32 mg/dL (ref 0–99)
Triglycerides: 174 mg/dL — ABNORMAL HIGH (ref 0–149)
VLDL CHOLESTEROL CAL: 35 mg/dL (ref 5–40)

## 2017-10-09 NOTE — Telephone Encounter (Signed)
Informed patient of sleep study results and patient understanding was verbalized. Patient understands his sleep study showed they have significant sleep apnea and had successful PAP titration and will be set up with PAP unit.   Upon patient request DME selection is [facility]. Patient understands he will be contacted by [facility] to set up his cpap. Patient understands to call if [facility] does not contact him with new setup in a timely manner. Patient understands they will be called once confirmation has been received from [facility] that they have received their new machine to schedule 10 week follow up appointment.  [facility] notified of new cpap order  Please add to airview Patient was grateful for the call and thanked me. 

## 2017-10-09 NOTE — Telephone Encounter (Signed)
-----   Message from Sueanne Margarita, MD sent at 10/04/2017  4:52 PM EDT ----- Please let patient know that they have significant sleep apnea and had successful PAP titration and will be set up with PAP unit.  Please let DME know that order is in EPIC.  Please set patient up for OV in 10 weeks

## 2017-10-11 ENCOUNTER — Telehealth: Payer: Self-pay | Admitting: *Deleted

## 2017-10-11 ENCOUNTER — Telehealth: Payer: Self-pay | Admitting: Pharmacist

## 2017-10-11 NOTE — Telephone Encounter (Signed)
I was informed that [place] is presently taking applications to help in assisting pts with medications, I called Mr Arts back and gave them PAN F number.

## 2017-10-11 NOTE — Telephone Encounter (Signed)
Thanks

## 2017-10-11 NOTE — Telephone Encounter (Signed)
I have previously seen pt in clinic for lipid management and have received a few follow up emails for non-lipid related requests. Received email 9/30 with the following message:   Greetings!  You helped me get my Praluent prescription filled last year.  I saw Dr Tamala Julian last Friday.  He gave me a prescription for [name] ("My Chart" notes).   I have taken the prescription to several pharmacies and the best price I have found is $265.00 a month ($3,180.00/year).  I was hoping you might be able to find it at a better price.  Thanks for any help you can provide!  Tommy [name] (05/24/2050)    Will route message to patient assistance team as well as Dr Tamala Julian as an [place]ier.

## 2017-10-11 NOTE — Telephone Encounter (Signed)
Spoke with patient over phone and discussed assistance with ENTRESTO, will mail him the form from [facility].

## 2017-10-16 ENCOUNTER — Telehealth: Payer: Self-pay | Admitting: *Deleted

## 2017-10-16 ENCOUNTER — Telehealth: Payer: Self-pay | Admitting: Interventional Cardiology

## 2017-10-16 ENCOUNTER — Ambulatory Visit (INDEPENDENT_AMBULATORY_CARE_PROVIDER_SITE_OTHER): Payer: Medicare Other | Admitting: *Deleted

## 2017-10-16 DIAGNOSIS — I5022 Chronic systolic (congestive) heart failure: Secondary | ICD-10-CM

## 2017-10-16 DIAGNOSIS — I255 Ischemic cardiomyopathy: Secondary | ICD-10-CM

## 2017-10-16 NOTE — Telephone Encounter (Signed)
Started Entresto medication   Am I supposed to stop another medication?  Thanks!  Rbt   Above message received from pt through [place].  Called pt and advised he needs to stop Losartan.  Pt started Entresto 10/4.  Scheduled pt to get labs on 10/15 and f/u with Dr. Tamala Julian 10/11.  Advised pt to monitor BP and report if hypotensive or if he develops lightheadedness or dizziness.  Pt verbalized understanding and was in agreement with this plan.

## 2017-10-16 NOTE — Telephone Encounter (Signed)
-----   Message from Loren Racer, LPN sent at 46/09/5070 12:40 PM EDT ----- Tommy [name],  Spoke with this pt a little while ago.  He asked about his CPAP orders.  I seen where you noted that they have been sent to [facility].  Pt states he has not heard anything from them.  Can you reach out to them and see where we stand on this and update pt or have them contact him?  Thanks [employer]

## 2017-10-16 NOTE — Telephone Encounter (Signed)
Reached out to patient and he understands the insurance takes 15 business days to make a decision of approval or denial and that his home health agency will contact him once his unit comes in. Pt is aware and agreeable to this treatment.

## 2017-10-17 NOTE — Progress Notes (Signed)
Remote ICD transmission.   

## 2017-10-23 ENCOUNTER — Other Ambulatory Visit: Payer: Medicare Other

## 2017-10-23 DIAGNOSIS — I5022 Chronic systolic (congestive) heart failure: Secondary | ICD-10-CM | POA: Diagnosis not present

## 2017-10-23 LAB — BASIC METABOLIC PANEL
BUN/Creatinine Ratio: 18 (ref 10–24)
BUN: 19 mg/dL (ref 8–27)
CO2: 23 mmol/L (ref 20–29)
CREATININE: 1.05 mg/dL (ref 0.76–1.27)
Calcium: 9.5 mg/dL (ref 8.6–10.2)
Chloride: 101 mmol/L (ref 96–106)
GFR, EST AFRICAN AMERICAN: 84 mL/min/{1.73_m2} (ref >59)
GFR, EST NON AFRICAN AMERICAN: 73 mL/min/{1.73_m2} (ref >59)
Glucose: 116 mg/dL — ABNORMAL HIGH (ref 65–99)
POTASSIUM: 4.7 mmol/L (ref 3.5–5.2)
SODIUM: 140 mmol/L (ref 134–144)

## 2017-10-23 NOTE — Telephone Encounter (Signed)
Patient has a 10 week follow up appointment scheduled for 12/5/ 2019. Patient understands he needs to keep this appointment for insurance compliance. Patient was grateful for the call and thanked me.

## 2017-10-24 ENCOUNTER — Encounter: Payer: Self-pay | Admitting: Cardiology

## 2017-10-30 LAB — CUP PACEART REMOTE DEVICE CHECK
Implantable Lead Implant Date: 20180703
Implantable Lead Implant Date: 20180703
Implantable Lead Location: 753859
Implantable Lead Serial Number: 433305
Implantable Lead Serial Number: 801469
Implantable Pulse Generator Implant Date: 20180703
MDC IDC LEAD IMPLANT DT: 20180703
MDC IDC LEAD LOCATION: 753858
MDC IDC LEAD LOCATION: 753860
MDC IDC LEAD SERIAL: 901025
MDC IDC SESS DTM: 20191022123155
Pulse Gen Serial Number: 169965

## 2017-11-07 ENCOUNTER — Encounter: Payer: Self-pay | Admitting: Gastroenterology

## 2017-11-08 ENCOUNTER — Other Ambulatory Visit: Payer: Self-pay | Admitting: Interventional Cardiology

## 2017-11-09 ENCOUNTER — Other Ambulatory Visit: Payer: Self-pay | Admitting: *Deleted

## 2017-11-09 NOTE — Telephone Encounter (Signed)
See not on rx request

## 2017-11-09 NOTE — Telephone Encounter (Signed)
Pt should not be on Losartan or Valsartan as he is prescribed Entresto.  You can deny this.  Thanks!

## 2017-11-18 NOTE — Progress Notes (Signed)
Cardiology Office Note:    Date:  11/19/2017   ID:  [name], DOB 07-30-1950, MRN [id number]  PCP:  Patient, No Pcp Per  Cardiologist:  Sinclair Grooms, MD   Referring MD: No ref. provider found   Chief Complaint  Patient presents with  . Congestive Heart Failure  . Coronary Artery Disease  . Sleep Apnea    History of Present Illness:    JAHMIR SALO is a 67 y.o. male with a hx of ischemic cardiomyopathy, exertional angina pectoris, prior coronary bypass grafting 2002 with bypass graft failure and multiple native and bypass graft stents most recently October 2018, CRT-D 2018, hypertension, hyperlipidemia and chronic systolic heart failure, AICD, brief atrial fibrillation on telemetry, and OSA.  Avishai feels great.  Since last office visit he has been diagnosed with obstructive sleep apnea.  He has transitioned over to [place].  He is continuing to exercise.  He has had AICD device check with Dr. Lovena Le in September.  No orthopnea, PND, chest pain, palpitations, or syncope.   Past Medical History:  Diagnosis Date  . AICD (automatic cardioverter/defibrillator) present    BIV  . Anginal pain ([place])   . Blood transfusion without reported diagnosis   . CAD (coronary artery disease)    with CABG LIMA to LAD, free radial PDA, SVG to diagonal, SVG to OM. 2002. DES May 2011 and 01-2010 to SVG of Diag, native OM, and Native RCA. Unable to stent LAD via LIMA10/18 PCI to dRCA, multi-site DES to SVG--> OM  . GERD (gastroesophageal reflux disease)   . Hyperlipidemia    unable to tolarate statin therapy  . Hypertension   . Kidney stone   . Vertigo     Past Surgical History:  Procedure Laterality Date  . BIV ICD INSERTION CRT-D N/A 07/11/2016   Procedure: BiV ICD Insertion CRT-D;  Surgeon: Evans Lance, MD;  Location: [facility];  Service: Cardiovascular;  Laterality: N/A;  . CHOLECYSTECTOMY    . COLONOSCOPY    . CORONARY ARTERY BYPASS GRAFT    . CORONARY  BALLOON ANGIOPLASTY N/A 10/12/2016   Procedure: CORONARY BALLOON ANGIOPLASTY;  Surgeon: Belva Crome, MD;  Location: [facility];  Service: Cardiovascular;  Laterality: N/A;  . CORONARY STENT INTERVENTION  10/12/2016   PTCA of focal in-stent restenosis in the second obtuse marginal reducing 90% stenosis to 0%.  . CORONARY STENT INTERVENTION N/A 10/12/2016   Procedure: CORONARY STENT INTERVENTION;  Surgeon: Belva Crome, MD;  Location: [facility];  Service: Cardiovascular;  Laterality: N/A;  . LEFT HEART CATH AND CORS/GRAFTS ANGIOGRAPHY N/A 10/12/2016   Procedure: LEFT HEART CATH AND CORS/GRAFTS ANGIOGRAPHY;  Surgeon: Belva Crome, MD;  Location: [place] [facility];  Service: Cardiovascular;  Laterality: N/A;  . RIGHT/LEFT HEART CATH AND CORONARY/GRAFT ANGIOGRAPHY N/A 03/01/2016   Procedure: Right/Left Heart Cath and Coronary/Graft Angiography;  Surgeon: Belva Crome, MD;  Location: [facility];  Service: Cardiovascular;  Laterality: N/A;    Current Medications: Current Meds  Medication Sig  . Alirocumab (PRALUENT) 75 MG/ML SOPN Inject 1 pen into the skin every 14 (fourteen) days.  Marland Kitchen aspirin EC 81 MG tablet Take 81 mg by mouth daily.  . Cholecalciferol (VITAMIN D3) 5000 units CAPS Take 5,000 Units by mouth daily.  . clopidogrel (PLAVIX) 75 MG tablet TAKE 1 TABLET BY MOUTH EVERY DAY WITH A MEAL  . finasteride (PROPECIA) 1 MG tablet Take 1 mg by mouth  daily.  . nitroGLYCERIN (NITROSTAT) 0.4 MG SL tablet Place 1 tablet (0.4 mg total) under the tongue every 5 (five) minutes as needed for chest pain (MAX 3 TABLETS).  Marland Kitchen omeprazole (PRILOSEC) 40 MG capsule Take 1 capsule (40 mg total) by mouth daily.  . [DISCONTINUED] carvedilol (COREG) 12.5 MG tablet Take 1 tablet (12.5 mg total) by mouth 2 (two) times daily. Please keep upcoming appt for future refills. Thank you  . [DISCONTINUED] ENTRESTO 24-26 MG Take 1 tablet by mouth 2 (two) times daily.     Allergies:   Fish oil    Social History   Socioeconomic History  . Marital status: Married    Spouse name: Not on file  . Number of children: Not on file  . Years of education: Not on file  . Highest education level: Not on file  Occupational History  . Not on file  Social Needs  . Financial resource strain: Not on file  . Food insecurity:    Worry: Not on file    Inability: Not on file  . Transportation needs:    Medical: Not on file    Non-medical: Not on file  Tobacco Use  . Smoking status: Never Smoker  . Smokeless tobacco: Never Used  Substance and Sexual Activity  . Alcohol use: Yes    Alcohol/week: 1.0 standard drinks    Types: 1 Glasses of wine per week    Comment: RARE  . Drug use: No  . Sexual activity: Not on file  Lifestyle  . Physical activity:    Days per week: Not on file    Minutes per session: Not on file  . Stress: Not on file  Relationships  . Social connections:    Talks on phone: Not on file    Gets together: Not on file    Attends religious service: Not on file    Active member of club or organization: Not on file    Attends meetings of clubs or organizations: Not on file    Relationship status: Not on file  Other Topics Concern  . Not on file  Social History Narrative  . Not on file     Family History: The patient's family history includes Colon cancer in his father. There is no history of Sudden death, Hypertension, Hyperlipidemia, Heart attack, Diabetes, Rectal cancer, or Stomach cancer.  ROS:   Please see the history of present illness.    Erythematous facial rash.  He believes this is related to 1 of his medications.  All other systems reviewed and are negative.  EKGs/Labs/Other Studies Reviewed:    The following studies were reviewed today: No new cardiac data  EKG:  EKG is not ordered today.   Recent Labs: 10/09/2017: ALT 32 10/23/2017: BUN 19; Creatinine, Ser 1.05; Potassium 4.7; Sodium 140  Recent Lipid Panel    Component Value Date/Time    CHOL 107 10/09/2017 0805   TRIG 174 (H) 10/09/2017 0805   HDL 40 10/09/2017 0805   CHOLHDL 2.7 10/09/2017 0805   CHOLHDL 3.3 03/03/2015 0744   VLDL 22 03/03/2015 0744   LDLCALC 32 10/09/2017 0805   LDLDIRECT 81 10/24/2016 1119   LDLDIRECT 91.0 02/05/2014 0809    Physical Exam:    VS:  BP 114/62   Pulse 83   Ht 6' (1.829 m)   Wt 192 lb 3.2 oz (87.2 kg)   SpO2 98%   BMI 26.07 kg/m     Wt Readings from Last 3 Encounters:  11/19/17  192 lb 3.2 oz (87.2 kg)  10/05/17 188 lb 6.4 oz (85.5 kg)  10/01/17 185 lb (83.9 kg)     GEN:  Well nourished, well developed in no acute distress HEENT: Cheeks and forehead are erythematous NECK: No JVD. LYMPHATICS: No lymphadenopathy CARDIAC: RRR, no murmur, no gallop, no edema. VASCULAR: 2+ and symmetric bilateral radial, carotid, and posterior tibial pulses.  No bruits. RESPIRATORY:  Clear to auscultation without rales, wheezing or rhonchi  ABDOMEN: Soft, non-tender, non-distended, No pulsatile mass, MUSCULOSKELETAL: No deformity  SKIN: Warm and dry NEUROLOGIC:  Alert and oriented x 3 PSYCHIATRIC:  Normal affect   ASSESSMENT:    1. Chronic systolic heart failure ([place])   2. Coronary artery disease involving coronary bypass graft of native heart with angina pectoris (HCC)   3. Paroxysmal atrial fibrillation ([place])   4. Severe obstructive sleep apnea   5. Biventricular automatic implantable cardioverter defibrillator in situ   6. LBBB (left bundle branch block)   7. Other hyperlipidemia   8. Facial rash    PLAN:    In order of problems listed above:  1. Tolerating switch to carvedilol and Entresto.  He feels facial rash is related to carvedilol.  We will therefore switch him back to metoprolol succinate 25 mg/day.  Uptitrate Entresto to 49/51 mg p.o. twice daily.  Basic metabolic panel in 7 to 10 days.  After he is on the high dose Entresto and stable, we should increase metoprolol succinate to 50 mg/day. 2. No clinical symptoms of  angina since PCI greater than a year ago. 3. Recently diagnosed with sleep apnea.  Perhaps this is been an associated clinical etiology. 4. Compliant with CPAP.  Had greater than 30 apneas per hour with O2 saturations into the 70s. 5. Not discussed 6. Not discussed 7. LDL is now near 40 on Praluent.  No issues. 8. As noted above, will transition back to Toprol-XL 25 mg/day initially and if stable after transition of Entresto to the 49/51 mg dose, further increase Toprol-XL to 50 mg/day.  Clinical follow-up in 4 to 5 months.  Echocardiogram prior to that office visit.  Greater than 50% of the time during this office visit was spent in education, counseling, and coordination of care related to underlying disease process and testing as outlined.   Medication Adjustments/Labs and Tests Ordered: Current medicines are reviewed at length with the patient today.  Concerns regarding medicines are outlined above.  Orders Placed This Encounter  Procedures  . Basic metabolic panel  . ECHOCARDIOGRAM COMPLETE   Meds ordered this encounter  Medications  . sacubitril-valsartan (ENTRESTO) 49-51 MG    Sig: Take 1 tablet by mouth 2 (two) times daily.    Dispense:  60 tablet    Refill:  11  . metoprolol succinate (TOPROL-XL) 25 MG 24 hr tablet    Sig: Take 1 tablet (25 mg total) by mouth daily. Take with or immediately following a meal.    Dispense:  90 tablet    Refill:  3    Patient Instructions  Medication Instructions:  Your physician has recommended you make the following change in your medication:   1. STOP: carvedilol (coreg)  2. START: metoprolol succinate (Toprol-XL) 25 mg tablet: Take 1 tablet by mouth once a day  3. INCREASE: entresto to 49-51 mg twice a day   If you need a refill on your cardiac medications before your next appointment, please call your pharmacy.   Lab work: Your physician recommends that you return for  lab work in: 2 weeks for [employer]  If you have labs (blood  work) drawn today and your tests are completely normal, you will receive your results only by: Marland Kitchen MyChart Message (if you have MyChart) OR . A paper copy in the mail If you have any lab test that is abnormal or we need to change your treatment, we will call you to review the results.  Testing/Procedures: Your physician has requested that you have an echocardiogram in 4 MONTHS. Echocardiography is a painless test that uses sound waves to create images of your heart. It provides your doctor with information about the size and shape of your heart and how well your heart's chambers and valves are working. This procedure takes approximately one hour. There are no restrictions for this procedure.    Follow-Up: At [facility], you and your health needs are our priority.  As part of our continuing mission to provide you with exceptional heart care, we have created designated Provider Care Teams.  These Care Teams include your primary Cardiologist (physician) and Advanced Practice Providers (APPs -  Physician Assistants and Nurse Practitioners) who all work together to provide you with the care you need, when you need it. . You will need a follow up appointment in 4-6 MONTHS. Please call our office 2 months in advance to schedule this appointment.  You may see Sinclair Grooms, MD or one of the following Advanced Practice Providers on your designated Care Team:   . Truitt Merle, NP . Cecilie Kicks, NP . Kathyrn Drown, NP   Any Other Special Instructions Will Be Listed Below (If Applicable).     Signed, Sinclair Grooms, MD  11/19/2017 5:45 PM    [place]

## 2017-11-19 ENCOUNTER — Ambulatory Visit (INDEPENDENT_AMBULATORY_CARE_PROVIDER_SITE_OTHER): Payer: Medicare Other | Admitting: Interventional Cardiology

## 2017-11-19 ENCOUNTER — Encounter: Payer: Self-pay | Admitting: Interventional Cardiology

## 2017-11-19 VITALS — BP 114/62 | HR 83 | Ht 72.0 in | Wt 192.2 lb

## 2017-11-19 DIAGNOSIS — R21 Rash and other nonspecific skin eruption: Secondary | ICD-10-CM

## 2017-11-19 DIAGNOSIS — I25709 Atherosclerosis of coronary artery bypass graft(s), unspecified, with unspecified angina pectoris: Secondary | ICD-10-CM

## 2017-11-19 DIAGNOSIS — I255 Ischemic cardiomyopathy: Secondary | ICD-10-CM | POA: Diagnosis not present

## 2017-11-19 DIAGNOSIS — Z95810 Presence of automatic (implantable) cardiac defibrillator: Secondary | ICD-10-CM

## 2017-11-19 DIAGNOSIS — I5022 Chronic systolic (congestive) heart failure: Secondary | ICD-10-CM | POA: Diagnosis not present

## 2017-11-19 DIAGNOSIS — I447 Left bundle-branch block, unspecified: Secondary | ICD-10-CM

## 2017-11-19 DIAGNOSIS — E7849 Other hyperlipidemia: Secondary | ICD-10-CM | POA: Diagnosis not present

## 2017-11-19 DIAGNOSIS — I480 Paroxysmal atrial fibrillation: Secondary | ICD-10-CM | POA: Diagnosis not present

## 2017-11-19 DIAGNOSIS — G4733 Obstructive sleep apnea (adult) (pediatric): Secondary | ICD-10-CM

## 2017-11-19 MED ORDER — METOPROLOL SUCCINATE ER 25 MG PO TB24: 25.0000 mg | ORAL_TABLET | Freq: Every day | ORAL | 3 refills | Status: DC

## 2017-11-19 MED ORDER — SACUBITRIL-VALSARTAN 49-51 MG PO TABS: 1.0000 | ORAL_TABLET | Freq: Two times a day (BID) | ORAL | 11 refills | Status: DC

## 2017-11-19 NOTE — Patient Instructions (Signed)
Medication Instructions:  Your physician has recommended you make the following change in your medication:   1. STOP: carvedilol (coreg)  2. START: metoprolol succinate (Toprol-XL) 25 mg tablet: Take 1 tablet by mouth once a day  3. INCREASE: entresto to 49-51 mg twice a day   If you need a refill on your cardiac medications before your next appointment, please call your pharmacy.   Lab work: Your physician recommends that you return for lab work in: 2 weeks for BMET  If you have labs (blood work) drawn today and your tests are completely normal, you will receive your results only by: Marland Kitchen MyChart Message (if you have MyChart) OR . A paper copy in the mail If you have any lab test that is abnormal or we need to change your treatment, we will call you to review the results.  Testing/Procedures: Your physician has requested that you have an echocardiogram in 4 MONTHS. Echocardiography is a painless test that uses sound waves to create images of your heart. It provides your doctor with information about the size and shape of your heart and how well your heart's chambers and valves are working. This procedure takes approximately one hour. There are no restrictions for this procedure.    Follow-Up: At [facility], you and your health needs are our priority.  As part of our continuing mission to provide you with exceptional heart care, we have created designated Provider Care Teams.  These Care Teams include your primary Cardiologist (physician) and Advanced Practice Providers (APPs -  Physician Assistants and Nurse Practitioners) who all work together to provide you with the care you need, when you need it. . You will need a follow up appointment in 4-6 MONTHS. Please call our office 2 months in advance to schedule this appointment.  You may see Sinclair Grooms, MD or one of the following Advanced Practice Providers on your designated Care Team:   . Truitt Merle, NP . Cecilie Kicks,  NP . Kathyrn Drown, NP   Any Other Special Instructions Will Be Listed Below (If Applicable).

## 2017-11-28 LAB — CUP PACEART INCLINIC DEVICE CHECK
Date Time Interrogation Session: 20190904040000
HighPow Impedance: 78 Ohm
Implantable Lead Implant Date: 20180703
Implantable Lead Location: 753858
Implantable Lead Location: 753859
Implantable Lead Model: 7741
Implantable Lead Serial Number: 433305
Implantable Lead Serial Number: 901025
Implantable Pulse Generator Implant Date: 20180703
Lead Channel Pacing Threshold Amplitude: 0.6 V
Lead Channel Pacing Threshold Amplitude: 0.7 V
Lead Channel Pacing Threshold Pulse Width: 0.4 ms
Lead Channel Sensing Intrinsic Amplitude: 21.5 mV
Lead Channel Sensing Intrinsic Amplitude: 6.6 mV
Lead Channel Setting Pacing Amplitude: 2 V
Lead Channel Setting Pacing Amplitude: 2 V
Lead Channel Setting Pacing Pulse Width: 0.4 ms
Lead Channel Setting Sensing Sensitivity: 1 mV
MDC IDC LEAD IMPLANT DT: 20180703
MDC IDC LEAD IMPLANT DT: 20180703
MDC IDC LEAD LOCATION: 753860
MDC IDC LEAD SERIAL: 801469
MDC IDC MSMT LEADCHNL LV IMPEDANCE VALUE: 679 Ohm
MDC IDC MSMT LEADCHNL LV SENSING INTR AMPL: 25 mV — AB
MDC IDC MSMT LEADCHNL RA IMPEDANCE VALUE: 591 Ohm
MDC IDC MSMT LEADCHNL RA PACING THRESHOLD AMPLITUDE: 1.1 V
MDC IDC MSMT LEADCHNL RA PACING THRESHOLD PULSEWIDTH: 0.4 ms
MDC IDC MSMT LEADCHNL RV IMPEDANCE VALUE: 610 Ohm
MDC IDC MSMT LEADCHNL RV PACING THRESHOLD PULSEWIDTH: 0.4 ms
MDC IDC SET LEADCHNL LV PACING AMPLITUDE: 1.5 V
MDC IDC SET LEADCHNL LV PACING PULSEWIDTH: 0.4 ms
MDC IDC SET LEADCHNL RV SENSING SENSITIVITY: 0.6 mV
Pulse Gen Serial Number: 169965

## 2017-12-04 ENCOUNTER — Other Ambulatory Visit: Payer: Medicare Other | Admitting: *Deleted

## 2017-12-04 DIAGNOSIS — E7849 Other hyperlipidemia: Secondary | ICD-10-CM

## 2017-12-04 DIAGNOSIS — I480 Paroxysmal atrial fibrillation: Secondary | ICD-10-CM

## 2017-12-04 DIAGNOSIS — G4733 Obstructive sleep apnea (adult) (pediatric): Secondary | ICD-10-CM

## 2017-12-04 DIAGNOSIS — Z95810 Presence of automatic (implantable) cardiac defibrillator: Secondary | ICD-10-CM

## 2017-12-04 DIAGNOSIS — I25709 Atherosclerosis of coronary artery bypass graft(s), unspecified, with unspecified angina pectoris: Secondary | ICD-10-CM | POA: Diagnosis not present

## 2017-12-04 DIAGNOSIS — I447 Left bundle-branch block, unspecified: Secondary | ICD-10-CM

## 2017-12-04 DIAGNOSIS — I5022 Chronic systolic (congestive) heart failure: Secondary | ICD-10-CM | POA: Diagnosis not present

## 2017-12-04 LAB — BASIC METABOLIC PANEL
BUN/Creatinine Ratio: 15 (ref 10–24)
BUN: 14 mg/dL (ref 8–27)
CO2: 24 mmol/L (ref 20–29)
CREATININE: 0.93 mg/dL (ref 0.76–1.27)
Calcium: 9.6 mg/dL (ref 8.6–10.2)
Chloride: 99 mmol/L (ref 96–106)
GFR calc Af Amer: 98 mL/min/{1.73_m2} (ref >59)
GFR calc non Af Amer: 85 mL/min/{1.73_m2} (ref >59)
GLUCOSE: 113 mg/dL — AB (ref 65–99)
POTASSIUM: 4 mmol/L (ref 3.5–5.2)
Sodium: 138 mmol/L (ref 134–144)

## 2017-12-05 ENCOUNTER — Telehealth: Payer: Self-pay | Admitting: Interventional Cardiology

## 2017-12-05 DIAGNOSIS — H18832 Recurrent erosion of cornea, left eye: Secondary | ICD-10-CM | POA: Diagnosis not present

## 2017-12-05 NOTE — Telephone Encounter (Signed)
Do not encounter

## 2017-12-10 DIAGNOSIS — R109 Unspecified abdominal pain: Secondary | ICD-10-CM | POA: Diagnosis not present

## 2017-12-10 DIAGNOSIS — R339 Retention of urine, unspecified: Secondary | ICD-10-CM | POA: Diagnosis not present

## 2017-12-10 DIAGNOSIS — N2889 Other specified disorders of kidney and ureter: Secondary | ICD-10-CM | POA: Diagnosis not present

## 2017-12-10 DIAGNOSIS — R350 Frequency of micturition: Secondary | ICD-10-CM | POA: Diagnosis not present

## 2017-12-13 ENCOUNTER — Encounter: Payer: Self-pay | Admitting: Cardiology

## 2017-12-13 ENCOUNTER — Telehealth: Payer: Self-pay | Admitting: *Deleted

## 2017-12-13 ENCOUNTER — Ambulatory Visit (INDEPENDENT_AMBULATORY_CARE_PROVIDER_SITE_OTHER): Payer: Medicare Other | Admitting: Cardiology

## 2017-12-13 VITALS — BP 124/82 | HR 89 | Ht 72.0 in | Wt 195.4 lb

## 2017-12-13 DIAGNOSIS — G4733 Obstructive sleep apnea (adult) (pediatric): Secondary | ICD-10-CM

## 2017-12-13 DIAGNOSIS — I255 Ischemic cardiomyopathy: Secondary | ICD-10-CM

## 2017-12-13 DIAGNOSIS — Z9989 Dependence on other enabling machines and devices: Secondary | ICD-10-CM

## 2017-12-13 DIAGNOSIS — I10 Essential (primary) hypertension: Secondary | ICD-10-CM | POA: Diagnosis not present

## 2017-12-13 HISTORY — DX: Obstructive sleep apnea (adult) (pediatric): G47.33

## 2017-12-13 NOTE — Progress Notes (Signed)
Cardiology Office Note:    Date:  12/13/2017   ID:  Tommy [name], DOB 1950/10/13, MRN [id number]  PCP:  Patient, No Pcp Per  Cardiologist:  Sinclair Grooms, MD    Referring MD: No ref. provider found   Chief Complaint  Patient presents with  . Sleep Apnea  . Hypertension    History of Present Illness:    Tommy [name] is a 67 y.o. male with a hx of ASCAD, HTN and hyperlipidemia.  He was referred for sleep study due to CHF, sleep walking, snoring and witnessed apnea.  His epworth sleepiness score was 8.  He was found to have severe obstructive sleep apnea with an AHI of 30.3/h and no significant central sleep apnea.  His oxygen saturations dropped to 83%.  He underwent CPAP titration to 8 cm H2O.  He is doing well with his CPAP device and thinks that he has gotten used to it.  He tolerates the mask and feels the pressure is adequate.  Since going on CPAP he feels rested in the am and has no significant daytime sleepiness.  He denies any significant mouth or nasal dryness or nasal congestion.  He does not think that he snores.     Past Medical History:  Diagnosis Date  . AICD (automatic cardioverter/defibrillator) present    BIV  . Anginal pain ([place])   . Blood transfusion without reported diagnosis   . CAD (coronary artery disease)    with CABG LIMA to LAD, free radial PDA, SVG to diagonal, SVG to OM. 2002. DES May 2011 and 01-2010 to SVG of Diag, native OM, and Native RCA. Unable to stent LAD via LIMA10/18 PCI to dRCA, multi-site DES to SVG--> OM  . GERD (gastroesophageal reflux disease)   . Hyperlipidemia    unable to tolarate statin therapy  . Hypertension   . Kidney stone   . OSA on CPAP 12/13/2017   Severe obstructive sleep apnea with an AHI of 30.3/h and no significant central sleep apnea.  His oxygen saturations dropped to 83%.  He is on CPAP at 8 cm H2O.  . Vertigo     Past Surgical History:  Procedure Laterality Date  . BIV ICD INSERTION CRT-D N/A 07/11/2016   Procedure: BiV ICD Insertion CRT-D;  Surgeon: Evans Lance, MD;  Location: [facility];  Service: Cardiovascular;  Laterality: N/A;  . CHOLECYSTECTOMY    . COLONOSCOPY    . CORONARY ARTERY BYPASS GRAFT    . CORONARY BALLOON ANGIOPLASTY N/A 10/12/2016   Procedure: CORONARY BALLOON ANGIOPLASTY;  Surgeon: Belva Crome, MD;  Location: [facility];  Service: Cardiovascular;  Laterality: N/A;  . CORONARY STENT INTERVENTION  10/12/2016   PTCA of focal in-stent restenosis in the second obtuse marginal reducing 90% stenosis to 0%.  . CORONARY STENT INTERVENTION N/A 10/12/2016   Procedure: CORONARY STENT INTERVENTION;  Surgeon: Belva Crome, MD;  Location: [facility];  Service: Cardiovascular;  Laterality: N/A;  . LEFT HEART CATH AND CORS/GRAFTS ANGIOGRAPHY N/A 10/12/2016   Procedure: LEFT HEART CATH AND CORS/GRAFTS ANGIOGRAPHY;  Surgeon: Belva Crome, MD;  Location: [facility];  Service: Cardiovascular;  Laterality: N/A;  . RIGHT/LEFT HEART CATH AND CORONARY/GRAFT ANGIOGRAPHY N/A 03/01/2016   Procedure: Right/Left Heart Cath and Coronary/Graft Angiography;  Surgeon: Belva Crome, MD;  Location: [facility] [place] [facility];  Service: Cardiovascular;  Laterality: N/A;    Current Medications: Current Meds  Medication Sig  .  Alirocumab (PRALUENT) 75 MG/ML SOPN Inject 1 pen into the skin every 14 (fourteen) days.  Marland Kitchen aspirin EC 81 MG tablet Take 81 mg by mouth daily.  . Cholecalciferol (VITAMIN D3) 5000 units CAPS Take 5,000 Units by mouth daily.  . clopidogrel (PLAVIX) 75 MG tablet TAKE 1 TABLET BY MOUTH EVERY DAY WITH A MEAL  . finasteride (PROPECIA) 1 MG tablet Take 1 mg by mouth daily.  . metoprolol succinate (TOPROL-XL) 25 MG 24 hr tablet Take 1 tablet (25 mg total) by mouth daily. Take with or immediately following a meal.  . nitroGLYCERIN (NITROSTAT) 0.4 MG SL tablet Place 1 tablet (0.4 mg total) under the tongue every 5 (five) minutes as needed for chest pain (MAX 3  TABLETS).  Marland Kitchen omeprazole (PRILOSEC) 40 MG capsule Take 1 capsule (40 mg total) by mouth daily.  . sacubitril-valsartan (ENTRESTO) 49-51 MG Take 1 tablet by mouth 2 (two) times daily.     Allergies:   Fish oil   Social History   Socioeconomic History  . Marital status: Married    Spouse name: Not on file  . Number of children: Not on file  . Years of education: Not on file  . Highest education level: Not on file  Occupational History  . Not on file  Social Needs  . Financial resource strain: Not on file  . Food insecurity:    Worry: Not on file    Inability: Not on file  . Transportation needs:    Medical: Not on file    Non-medical: Not on file  Tobacco Use  . Smoking status: Never Smoker  . Smokeless tobacco: Never Used  Substance and Sexual Activity  . Alcohol use: Yes    Alcohol/week: 1.0 standard drinks    Types: 1 Glasses of wine per week    Comment: RARE  . Drug use: No  . Sexual activity: Not on file  Lifestyle  . Physical activity:    Days per week: Not on file    Minutes per session: Not on file  . Stress: Not on file  Relationships  . Social connections:    Talks on phone: Not on file    Gets together: Not on file    Attends religious service: Not on file    Active member of club or organization: Not on file    Attends meetings of clubs or organizations: Not on file    Relationship status: Not on file  Other Topics Concern  . Not on file  Social History Narrative  . Not on file     Family History: The patient's family history includes Colon cancer in his father. There is no history of Sudden death, Hypertension, Hyperlipidemia, Heart attack, Diabetes, Rectal cancer, or Stomach cancer.  ROS:   Please see the history of present illness.    ROS  All other systems reviewed and negative.   EKGs/Labs/Other Studies Reviewed:    The following studies were reviewed today: PAP download  EKG:  EKG is not ordered today.    Recent Labs: 10/09/2017: ALT  32 12/04/2017: BUN 14; Creatinine, Ser 0.93; Potassium 4.0; Sodium 138   Recent Lipid Panel    Component Value Date/Time   CHOL 107 10/09/2017 0805   TRIG 174 (H) 10/09/2017 0805   HDL 40 10/09/2017 0805   CHOLHDL 2.7 10/09/2017 0805   CHOLHDL 3.3 03/03/2015 0744   VLDL 22 03/03/2015 0744   LDLCALC 32 10/09/2017 0805   LDLDIRECT 81 10/24/2016 1119   LDLDIRECT  91.0 02/05/2014 0809    Physical Exam:    VS:  BP 124/82   Pulse 89   Ht 6' (1.829 m)   Wt 195 lb 6.4 oz (88.6 kg)   SpO2 98%   BMI 26.50 kg/m     Wt Readings from Last 3 Encounters:  12/13/17 195 lb 6.4 oz (88.6 kg)  11/19/17 192 lb 3.2 oz (87.2 kg)  10/05/17 188 lb 6.4 oz (85.5 kg)     GEN:  Well nourished, well developed in no acute distress HEENT: Normal NECK: No JVD; No carotid bruits LYMPHATICS: No lymphadenopathy CARDIAC: RRR, no murmurs, rubs, gallops RESPIRATORY:  Clear to auscultation without rales, wheezing or rhonchi  ABDOMEN: Soft, non-tender, non-distended MUSCULOSKELETAL:  No edema; No deformity  SKIN: Warm and dry NEUROLOGIC:  Alert and oriented x 3 PSYCHIATRIC:  Normal affect   ASSESSMENT:    1. OSA on CPAP   2. HYPERTENSION, BENIGN SYSTEMIC    PLAN:    In order of problems listed above:  1.  OSA - the patient is tolerating PAP therapy well without any problems. The PAP download was reviewed today and showed an AHI of 6.5/hr on 8 cm H2O with 100% compliance in using more than 4 hours nightly.  The patient has been using and benefiting from PAP use and will continue to benefit from therapy. His AHI is mildly elevated today so I will increase PAP to 9cm H2O and get a download on 2 weeks.   2.  HTN -BP is controlled on exam today.  He will continue on Toprol-XL 25 mg daily and Entresto 49-51 mg twice daily.   Medication Adjustments/Labs and Tests Ordered: Current medicines are reviewed at length with the patient today.  Concerns regarding medicines are outlined above.  No orders of the  defined types were placed in this encounter.  No orders of the defined types were placed in this encounter.   Signed, Fransico Him, MD  12/13/2017 10:33 AM    [place]

## 2017-12-13 NOTE — Patient Instructions (Signed)
Medication Instructions:  Your physician recommends that you continue on your current medications as directed. Please refer to the Current Medication list given to you today.] If you need a refill on your cardiac medications before your next appointment, please call your pharmacy.   Lab work:  If you have labs (blood work) drawn today and your tests are completely normal, you will receive your results only by: . MyChart Message (if you have MyChart) OR . A paper copy in the mail If you have any lab test that is abnormal or we need to change your treatment, we will call you to review the results.  Follow-Up: At [facility], you and your health needs are our priority.  As part of our continuing mission to provide you with exceptional heart care, we have created designated Provider Care Teams.  These Care Teams include your primary Cardiologist (physician) and Advanced Practice Providers (APPs -  Physician Assistants and Nurse Practitioners) who all work together to provide you with the care you need, when you need it. You will need a follow up appointment in 1 years.  Please call our office 2 months in advance to schedule this appointment.  You may see Dr. Turner   

## 2017-12-13 NOTE — Telephone Encounter (Signed)
[facility] to [facility]  

## 2017-12-13 NOTE — Telephone Encounter (Signed)
-----   Message from Sarina Ill, RN sent at 12/13/2017  4:09 PM EST ----- Regarding: Sleep Hello, Dr. Radford Pax ordered change in CPAP 9 cm H2O and a download in 2 weeks. Thanks, [employer]

## 2017-12-31 ENCOUNTER — Telehealth: Payer: Self-pay

## 2017-12-31 NOTE — Telephone Encounter (Signed)
Spoke with pt regarding episode pt stated that he didn't recall any symptoms at that time and pt reported compliance with medications, informed pt that Dr. Lovena Le was out on vacation and that would review the episode with GT next week and call pt back if there were any recommendations pt voiced understanding

## 2017-12-31 NOTE — Telephone Encounter (Signed)
LVM for pt to call device clinic back, pt had episode of SVT on 12/29/17 at [place], ? Was pt taking Toprol- XL 25mg , ? Was pt symptomatic.

## 2018-01-01 ENCOUNTER — Telehealth: Payer: Self-pay

## 2018-01-01 NOTE — Telephone Encounter (Signed)
Pt called Benjamine Mola back to let her know He did feel a little dizzy after his episode on Saturday. He asked that Benjamine Mola give him a call back when she can.

## 2018-01-04 ENCOUNTER — Telehealth: Payer: Self-pay | Admitting: Interventional Cardiology

## 2018-01-04 DIAGNOSIS — R079 Chest pain, unspecified: Secondary | ICD-10-CM

## 2018-01-04 NOTE — Telephone Encounter (Signed)
New Message   Pt c/o of Chest Pain: STAT if CP now or developed within 24 hours  1. Are you having CP right now? Little bit   2. Are you experiencing any other symptoms (ex. SOB, nausea, vomiting, sweating)? No  3. How long have you been experiencing CP? Couple of days  4. Is your CP continuous or coming and going? Come and go  5. Have you taken Nitroglycerin? 1  ?

## 2018-01-04 NOTE — Telephone Encounter (Signed)
New Message     1. Has your device fired? Not sure  2. Is you device beeping? No  3. Are you experiencing draining or swelling at device site? No   4. Are you calling to see if we received your device transmission? No   5. Have you passed out? No     Please route to [place]

## 2018-01-04 NOTE — Telephone Encounter (Signed)
Spoke with pt informed him that I would make Dr. Lovena Le aware of dizziness with this episode on Monday pt voiced understanding.

## 2018-01-04 NOTE — Telephone Encounter (Signed)
Pt called stating that he has been having constant  pain on his chest for the last 2 days, and  that  Is the same kind of pain prior having the stent placement in 2018. Pt states that he had the pain in 2018 a few weeks prior having the stent placement. Pt states that Dr. Tamala Julian has been his cardiologist for 20 years. And that Dr Tamala Julian knows all his history. Pt wants to speak with Dr. Tamala Julian. Pt is  aware that MD is not in the office, and that he is on vacation this week. MD will be back in the hospital next week. Pt states that he will wait for his call. Pt is aware to go to the ER if symptoms get worse. Pt verbalized understanding.

## 2018-01-06 NOTE — Telephone Encounter (Signed)
Please get set up for repeat cath and possible PCI this week. Increase Toprol XL to 50 mg daily.

## 2018-01-07 ENCOUNTER — Telehealth: Payer: Self-pay | Admitting: Interventional Cardiology

## 2018-01-07 DIAGNOSIS — R079 Chest pain, unspecified: Secondary | ICD-10-CM | POA: Diagnosis not present

## 2018-01-07 LAB — CBC
HEMOGLOBIN: 14.6 g/dL (ref 13.0–17.7)
Hematocrit: 41.7 % (ref 37.5–51.0)
MCH: 30.6 pg (ref 26.6–33.0)
MCHC: 35 g/dL (ref 31.5–35.7)
MCV: 87 fL (ref 79–97)
Platelets: 323 10*3/uL (ref 150–450)
RBC: 4.77 x10E6/uL (ref 4.14–5.80)
RDW: 13.2 % (ref 12.3–15.4)
WBC: 7.5 10*3/uL (ref 3.4–10.8)

## 2018-01-07 NOTE — Telephone Encounter (Signed)
Pt called in today still having CP.  He said he does not feel like he needs to go to the hospital at this time.  States it feels like it did before where he ended up getting stents placed.  Scheduled pt to have cath done tomorrow.  Went over instructions and pt will come over today and have labs drawn.  Pt aware to go to ER with any change in symptoms.  Pt appreciative for call.

## 2018-01-07 NOTE — Telephone Encounter (Signed)
  Pt c/o of Chest Pain: 1. Are you having CP right now? discomfort  2. Are you experiencing any other symptoms (ex. SOB, nausea, vomiting, sweating)? No  3. How long have you been experiencing CP? Last week  4. Is your CP continuous or coming and going? Coming and going  5. Have you taken Nitroglycerin? Not today took some on Saturday

## 2018-01-08 ENCOUNTER — Encounter (HOSPITAL_COMMUNITY): Payer: Self-pay | Admitting: Interventional Cardiology

## 2018-01-08 ENCOUNTER — Other Ambulatory Visit: Payer: Self-pay

## 2018-01-08 ENCOUNTER — Ambulatory Visit (HOSPITAL_COMMUNITY)
Admission: RE | Admit: 2018-01-08 | Discharge: 2018-01-08 | Disposition: A | Discharge status: Home / Self Care | Payer: Medicare Other | Attending: Interventional Cardiology | Admitting: Interventional Cardiology

## 2018-01-08 ENCOUNTER — Encounter (HOSPITAL_COMMUNITY)
Admission: RE | Disposition: A | Discharge status: Home / Self Care | Payer: Self-pay | Source: Home / Self Care | Attending: Interventional Cardiology

## 2018-01-08 DIAGNOSIS — Z95810 Presence of automatic (implantable) cardiac defibrillator: Secondary | ICD-10-CM | POA: Insufficient documentation

## 2018-01-08 DIAGNOSIS — E785 Hyperlipidemia, unspecified: Secondary | ICD-10-CM | POA: Insufficient documentation

## 2018-01-08 DIAGNOSIS — K219 Gastro-esophageal reflux disease without esophagitis: Secondary | ICD-10-CM | POA: Diagnosis not present

## 2018-01-08 DIAGNOSIS — Z955 Presence of coronary angioplasty implant and graft: Secondary | ICD-10-CM | POA: Diagnosis not present

## 2018-01-08 DIAGNOSIS — G4733 Obstructive sleep apnea (adult) (pediatric): Secondary | ICD-10-CM | POA: Insufficient documentation

## 2018-01-08 DIAGNOSIS — I2582 Chronic total occlusion of coronary artery: Secondary | ICD-10-CM | POA: Insufficient documentation

## 2018-01-08 DIAGNOSIS — I471 Supraventricular tachycardia: Secondary | ICD-10-CM | POA: Insufficient documentation

## 2018-01-08 DIAGNOSIS — I200 Unstable angina: Secondary | ICD-10-CM

## 2018-01-08 DIAGNOSIS — Z7982 Long term (current) use of aspirin: Secondary | ICD-10-CM | POA: Diagnosis not present

## 2018-01-08 DIAGNOSIS — I25119 Atherosclerotic heart disease of native coronary artery with unspecified angina pectoris: Secondary | ICD-10-CM | POA: Diagnosis present

## 2018-01-08 DIAGNOSIS — I110 Hypertensive heart disease with heart failure: Secondary | ICD-10-CM | POA: Insufficient documentation

## 2018-01-08 DIAGNOSIS — I255 Ischemic cardiomyopathy: Secondary | ICD-10-CM | POA: Diagnosis not present

## 2018-01-08 DIAGNOSIS — I25709 Atherosclerosis of coronary artery bypass graft(s), unspecified, with unspecified angina pectoris: Secondary | ICD-10-CM | POA: Insufficient documentation

## 2018-01-08 DIAGNOSIS — I5022 Chronic systolic (congestive) heart failure: Secondary | ICD-10-CM | POA: Diagnosis not present

## 2018-01-08 DIAGNOSIS — Z7902 Long term (current) use of antithrombotics/antiplatelets: Secondary | ICD-10-CM | POA: Diagnosis not present

## 2018-01-08 DIAGNOSIS — I10 Essential (primary) hypertension: Secondary | ICD-10-CM | POA: Diagnosis present

## 2018-01-08 HISTORY — PX: LEFT HEART CATH AND CORS/GRAFTS ANGIOGRAPHY: CATH118250

## 2018-01-08 HISTORY — PX: ULTRASOUND GUIDANCE FOR VASCULAR ACCESS: SHX6516

## 2018-01-08 LAB — BASIC METABOLIC PANEL
BUN/Creatinine Ratio: 14 (ref 10–24)
BUN: 14 mg/dL (ref 8–27)
CO2: 24 mmol/L (ref 20–29)
CREATININE: 1 mg/dL (ref 0.76–1.27)
Calcium: 9.3 mg/dL (ref 8.6–10.2)
Chloride: 104 mmol/L (ref 96–106)
GFR calc Af Amer: 90 mL/min/{1.73_m2} (ref >59)
GFR, EST NON AFRICAN AMERICAN: 78 mL/min/{1.73_m2} (ref >59)
Glucose: 88 mg/dL (ref 65–99)
Potassium: 4.6 mmol/L (ref 3.5–5.2)
Sodium: 143 mmol/L (ref 134–144)

## 2018-01-08 SURGERY — LEFT HEART CATH AND CORS/GRAFTS ANGIOGRAPHY
Anesthesia: LOCAL

## 2018-01-08 MED ORDER — SODIUM CHLORIDE 0.9 % IV SOLN: INTRAVENOUS | Status: AC

## 2018-01-08 MED ORDER — HEPARIN (PORCINE) IN NACL 1000-0.9 UT/500ML-% IV SOLN
INTRAVENOUS | Status: DC | PRN
  Administered 2018-01-08: 500 mL

## 2018-01-08 MED ORDER — SODIUM CHLORIDE 0.9 % WEIGHT BASED INFUSION
3.0000 mL/kg/h | INTRAVENOUS | Status: AC
  Administered 2018-01-08: 3 mL/kg/h via INTRAVENOUS

## 2018-01-08 MED ORDER — SODIUM CHLORIDE 0.9% FLUSH: 3.0000 mL | Freq: Two times a day (BID) | INTRAVENOUS | Status: DC

## 2018-01-08 MED ORDER — MIDAZOLAM HCL 2 MG/2ML IJ SOLN
INTRAMUSCULAR | Status: AC
  Filled 2018-01-08: qty 2

## 2018-01-08 MED ORDER — SODIUM CHLORIDE 0.9 % IV SOLN: 250.0000 mL | INTRAVENOUS | Status: DC | PRN

## 2018-01-08 MED ORDER — LIDOCAINE HCL (PF) 1 % IJ SOLN
INTRAMUSCULAR | Status: AC
  Filled 2018-01-08: qty 30

## 2018-01-08 MED ORDER — ONDANSETRON HCL 4 MG/2ML IJ SOLN: 4.0000 mg | Freq: Four times a day (QID) | INTRAMUSCULAR | Status: DC | PRN

## 2018-01-08 MED ORDER — IOHEXOL 350 MG/ML SOLN
INTRAVENOUS | Status: DC | PRN
  Administered 2018-01-08: 125 mL via INTRA_ARTERIAL

## 2018-01-08 MED ORDER — SODIUM CHLORIDE 0.9% FLUSH: 3.0000 mL | INTRAVENOUS | Status: DC | PRN

## 2018-01-08 MED ORDER — LIDOCAINE HCL (PF) 1 % IJ SOLN
INTRAMUSCULAR | Status: DC | PRN
  Administered 2018-01-08: 12 mL

## 2018-01-08 MED ORDER — MIDAZOLAM HCL 2 MG/2ML IJ SOLN
INTRAMUSCULAR | Status: DC | PRN
  Administered 2018-01-08 (×2): 1 mg via INTRAVENOUS

## 2018-01-08 MED ORDER — FENTANYL CITRATE (PF) 100 MCG/2ML IJ SOLN
INTRAMUSCULAR | Status: DC | PRN
  Administered 2018-01-08: 25 ug via INTRAVENOUS

## 2018-01-08 MED ORDER — ISOSORBIDE MONONITRATE ER 30 MG PO TB24: 30.0000 mg | ORAL_TABLET | Freq: Every day | ORAL | 11 refills | Status: DC

## 2018-01-08 MED ORDER — FENTANYL CITRATE (PF) 100 MCG/2ML IJ SOLN
INTRAMUSCULAR | Status: AC
  Filled 2018-01-08: qty 2

## 2018-01-08 MED ORDER — ASPIRIN 81 MG PO CHEW: 81.0000 mg | CHEWABLE_TABLET | ORAL | Status: DC

## 2018-01-08 MED ORDER — HEPARIN (PORCINE) IN NACL 1000-0.9 UT/500ML-% IV SOLN
INTRAVENOUS | Status: AC
  Filled 2018-01-08: qty 500

## 2018-01-08 MED ORDER — SODIUM CHLORIDE 0.9 % WEIGHT BASED INFUSION: 1.0000 mL/kg/h | INTRAVENOUS | Status: DC

## 2018-01-08 MED ORDER — ACETAMINOPHEN 325 MG PO TABS: 650.0000 mg | ORAL_TABLET | ORAL | Status: DC | PRN

## 2018-01-08 SURGICAL SUPPLY — 11 items
CATH INFINITI 5 FR IM (CATHETERS) ×1 IMPLANT
CATH INFINITI 5FR JL4 (CATHETERS) ×1 IMPLANT
CATH INFINITI 5FR MPB2 (CATHETERS) ×1 IMPLANT
GUIDEWIRE ANGLED .035X150CM (WIRE) ×1 IMPLANT
KIT HEART LEFT (KITS) ×3 IMPLANT
PACK CARDIAC CATHETERIZATION (CUSTOM PROCEDURE TRAY) ×3 IMPLANT
SHEATH PINNACLE 5F 10CM (SHEATH) ×1 IMPLANT
SHEATH PROBE COVER 6X72 (BAG) ×1 IMPLANT
TRANSDUCER W/STOPCOCK (MISCELLANEOUS) ×3 IMPLANT
TUBING CIL FLEX 10 FLL-RA (TUBING) ×3 IMPLANT
WIRE EMERALD 3MM-J .035X150CM (WIRE) ×1 IMPLANT

## 2018-01-08 NOTE — CV Procedure (Signed)
   Access via right femoral using vascular ultrasound guidance.  Totally occluded proximal to mid LAD, patent circumflex with full metal jacket and persistent eccentric 50% mid RCA stenosis unchanged and widely patent most recently placed distal RCA stent.  Left main is patent.  The large obtuse marginal branch that previously underwent angioplasty for focal ISR remains patent with less than 40% narrowing.  Saphenous vein graft to the large diagonal which was previously stented in multiple sites in October 2018 remains widely patent.  LAD beyond a very tortuous and long left internal mammary graft contains focal 90% stenosis.  Angiographically unchanged from prior study 1 year ago.  If angina is because of this lesion, our only approach is medical therapy as I do not believe we can reach the stenosis in the mid to distal LAD using the internal mammary artery as a conduit.  Will consider consult for CTO LAD.  Add long-acting nitrates.

## 2018-01-08 NOTE — H&P (Signed)
@LOGODEPT @ Cardiology Office Note:    Date:  01/08/2018   ID:  Tommy [name], DOB 1950-05-07, MRN [id number]  PCP:  Patient, No Pcp Per  Cardiologist:  Sinclair Grooms, MD   Referring MD: No ref. provider found   Chief Complaint: Recurrent angina requiring nitroglycerin starting on 12/29/2017  History of Present Illness:    Tommy [name] is a 67 y.o. male with a hx of ischemic cardiomyopathy, exertional angina pectoris, prior coronary bypass grafting 2002 with bypass graft failure and multiple native and bypass graft stents most recently October 2018, CRT-D 2018, hypertension, hyperlipidemia and chronic systolic heart failure, AICD, brief atrial fibrillation on telemetry, and OSA.Marland Kitchen  10-day history of angina with minimal activity.  Most recent PCI October 2018 with DES to distal RCA, non dilatable mid RCA,, PCI of ISR in obtuse marginal without stenting, and multiple stents saphenous vein graft to the diagonal.  Known to have occlusion of the free radial to the PDA and occlusion of the SVG to obtuse marginal.  Had AICD/resynchronization therapy July 2018.  Recent episode of SVT.  Past Medical History:  Diagnosis Date  . AICD (automatic cardioverter/defibrillator) present    BIV  . Anginal pain ([place])   . Blood transfusion without reported diagnosis   . CAD (coronary artery disease)    with CABG LIMA to LAD, free radial PDA, SVG to diagonal, SVG to OM. 2002. DES May 2011 and 01-2010 to SVG of Diag, native OM, and Native RCA. Unable to stent LAD via LIMA10/18 PCI to dRCA, multi-site DES to SVG--> OM  . GERD (gastroesophageal reflux disease)   . Hyperlipidemia    unable to tolarate statin therapy  . Hypertension   . Kidney stone   . OSA on CPAP 12/13/2017   Severe obstructive sleep apnea with an AHI of 30.3/h and no significant central sleep apnea.  His oxygen saturations dropped to 83%.  He is on CPAP at 8 cm H2O.  . Vertigo     Past Surgical History:  Procedure Laterality Date   . BIV ICD INSERTION CRT-D N/A 07/11/2016   Procedure: BiV ICD Insertion CRT-D;  Surgeon: Evans Lance, MD;  Location: [facility];  Service: Cardiovascular;  Laterality: N/A;  . CHOLECYSTECTOMY    . COLONOSCOPY    . CORONARY ARTERY BYPASS GRAFT    . CORONARY BALLOON ANGIOPLASTY N/A 10/12/2016   Procedure: CORONARY BALLOON ANGIOPLASTY;  Surgeon: Belva Crome, MD;  Location: [facility];  Service: Cardiovascular;  Laterality: N/A;  . CORONARY STENT INTERVENTION  10/12/2016   PTCA of focal in-stent restenosis in the second obtuse marginal reducing 90% stenosis to 0%.  . CORONARY STENT INTERVENTION N/A 10/12/2016   Procedure: CORONARY STENT INTERVENTION;  Surgeon: Belva Crome, MD;  Location: [facility];  Service: Cardiovascular;  Laterality: N/A;  . LEFT HEART CATH AND CORS/GRAFTS ANGIOGRAPHY N/A 10/12/2016   Procedure: LEFT HEART CATH AND CORS/GRAFTS ANGIOGRAPHY;  Surgeon: Belva Crome, MD;  Location: [facility];  Service: Cardiovascular;  Laterality: N/A;  . RIGHT/LEFT HEART CATH AND CORONARY/GRAFT ANGIOGRAPHY N/A 03/01/2016   Procedure: Right/Left Heart Cath and Coronary/Graft Angiography;  Surgeon: Belva Crome, MD;  Location: [facility];  Service: Cardiovascular;  Laterality: N/A;    Current Medications: Current Meds  Medication Sig  . Alirocumab (PRALUENT) 75 MG/ML SOPN Inject 1 pen into the skin every 14 (fourteen) days.  Marland Kitchen aspirin EC 81 MG tablet Take 81 mg  by mouth daily.  . cholecalciferol (VITAMIN D3) 25 MCG (1000 UT) tablet Take 1,000 Units by mouth daily.  . clopidogrel (PLAVIX) 75 MG tablet TAKE 1 TABLET BY MOUTH EVERY DAY WITH A MEAL (Patient taking differently: Take 75 mg by mouth daily at 3 pm. )  . metoprolol succinate (TOPROL-XL) 25 MG 24 hr tablet Take 1 tablet (25 mg total) by mouth daily. Take with or immediately following a meal.  . nitroGLYCERIN (NITROSTAT) 0.4 MG SL tablet Place 1 tablet (0.4 mg total) under the tongue every  5 (five) minutes as needed for chest pain (MAX 3 TABLETS).  Marland Kitchen omeprazole (PRILOSEC) 40 MG capsule Take 1 capsule (40 mg total) by mouth daily.  . RABEprazole (ACIPHEX) 20 MG tablet Take 20 mg by mouth at bedtime.  . sacubitril-valsartan (ENTRESTO) 49-51 MG Take 1 tablet by mouth 2 (two) times daily.  . sodium chloride (MURO 128) 2 % ophthalmic solution Place 1 drop into the left eye 3 (three) times daily.     Allergies:   Fish oil   Social History   Socioeconomic History  . Marital status: Married    Spouse name: Not on file  . Number of children: Not on file  . Years of education: Not on file  . Highest education level: Not on file  Occupational History  . Not on file  Social Needs  . Financial resource strain: Not on file  . Food insecurity:    Worry: Not on file    Inability: Not on file  . Transportation needs:    Medical: Not on file    Non-medical: Not on file  Tobacco Use  . Smoking status: Never Smoker  . Smokeless tobacco: Never Used  Substance and Sexual Activity  . Alcohol use: Yes    Alcohol/week: 1.0 standard drinks    Types: 1 Glasses of wine per week    Comment: RARE  . Drug use: No  . Sexual activity: Not on file  Lifestyle  . Physical activity:    Days per week: Not on file    Minutes per session: Not on file  . Stress: Not on file  Relationships  . Social connections:    Talks on phone: Not on file    Gets together: Not on file    Attends religious service: Not on file    Active member of club or organization: Not on file    Attends meetings of clubs or organizations: Not on file    Relationship status: Not on file  Other Topics Concern  . Not on file  Social History Narrative  . Not on file     Family History: The patient's family history includes Colon cancer in his father. There is no history of Sudden death, Hypertension, Hyperlipidemia, Heart attack, Diabetes, Rectal cancer, or Stomach cancer.  ROS:   Please see the history of present  illness.    Recurring episodes of dizziness recently.  All other systems reviewed and are negative.  EKGs/Labs/Other Studies Reviewed:    The following studies were reviewed today: No new data other than lab review which demonstrates a creatinine of 1.0 and hemoglobin of 14.6.  EKG:  EKG is paced rhythm.  Sinus with tracking.  Recent Labs: 10/09/2017: ALT 32 01/07/2018: BUN 14; Creatinine, Ser 1.00; Hemoglobin 14.6; Platelets 323; Potassium 4.6; Sodium 143  Recent Lipid Panel    Component Value Date/Time   CHOL 107 10/09/2017 0805   TRIG 174 (H) 10/09/2017 0805   HDL 40  10/09/2017 0805   CHOLHDL 2.7 10/09/2017 0805   CHOLHDL 3.3 03/03/2015 0744   VLDL 22 03/03/2015 0744   LDLCALC 32 10/09/2017 0805   LDLDIRECT 81 10/24/2016 1119   LDLDIRECT 91.0 02/05/2014 0809    Physical Exam:    VS:  BP (!) 172/95   Pulse 69   Temp 97.7 F (36.5 C) (Skin)   Ht 6' (1.829 m)   Wt 83.9 kg   SpO2 100%   BMI 25.09 kg/m     Wt Readings from Last 3 Encounters:  01/08/18 83.9 kg  12/13/17 88.6 kg  11/19/17 87.2 kg     GEN: Slender, healthy-appearing. No acute distress HEENT: Normal NECK: No JVD. LYMPHATICS: No lymphadenopathy CARDIAC: RRR.  No murmur, gallop, edema VASCULAR: Pulses absent left radial but 2+ right and 2+ bilateral femoral pulses., Bruits no carotid or femoral. RESPIRATORY:  Clear to auscultation without rales, wheezing or rhonchi  ABDOMEN: Soft, non-tender, non-distended, No pulsatile mass, MUSCULOSKELETAL: No deformity  SKIN: Warm and dry NEUROLOGIC:  Alert and oriented x 3 PSYCHIATRIC:  Normal affect   ASSESSMENT:    1. Coronary atherosclerotic heart disease with prior bypass grafting and multiple stents most recently in October 2018, now with recurrent angina pectoris.  He has had daily angina since December 29, 2017.  Multiple nitroglycerin tablets have been used.  No prolonged episodes.   PLAN:    In order of problems listed above:  The patient was  counseled to undergo left heart catheterization, coronary angiography, and possible percutaneous coronary intervention with stent implantation. The procedural risks and benefits were discussed in detail. The risks discussed included death, stroke, myocardial infarction, life-threatening bleeding, limb ischemia, kidney injury, allergy, and possible emergency cardiac surgery. The risk of these significant complications were estimated to occur less than 1% of the time. After discussion, the patient has agreed to proceed.   Medication Adjustments/Labs and Tests Ordered: Current medicines are reviewed at length with the patient today.  Concerns regarding medicines are outlined above.  Orders Placed This Encounter  Procedures  . Provider attestation of informed consent for procedure/surgical case  . Informed consent details: transcribe and obtain patient signature  . Confirm CBC and BMP (or CMP) results within 7 days for inpatient and 30 days for outpatient: Outpatients with severe anemia (hgb<10, CKD, severe thrombocytopenia plts<100) labs should be within 10 days. Only draw PT/INR on patients that are on Coumadin, Hgb<10, have liver disease (cirrhosis, liver CA, hepatitis, etc). Urine pregnancy test within hospital admission for inpatients of child bearing age, for outpatients day of procedure.  . Confirm EKG performed within 30 days for cardiac procedures and 12 months for peripheral vascular procedures.  Place order for EKG if missing or not within timeframe.  . Verify aspirin and / or anti-platelet medication (Plavix, Effient, Brilinta) dose available for cardiac / peripheral vascular procedure day. IF ordered daily / once, adjust schedule to administer before procedure.  . Weigh patient  . Initiate Cath/PCI clinical path; encourage patient to watch CCTV video  . EKG 12-Lead  . Insert peripheral IV  . Insert 2nd peripheral IV site-Saline lock IV   Meds ordered this encounter  Medications  . sodium  chloride flush (NS) 0.9 % injection 3 mL  . sodium chloride flush (NS) 0.9 % injection 3 mL  . 0.9 %  sodium chloride infusion  . aspirin chewable tablet 81 mg  . FOLLOWED BY Linked Order Group   . 0.9% sodium chloride infusion   . 0.9% sodium  chloride infusion    There are no outpatient Patient Instructions on file for this admission.   Signed, Sinclair Grooms, MD  01/08/2018 8:15 AM    [place]

## 2018-01-08 NOTE — Progress Notes (Signed)
Site area: rt groin Site Prior to Removal:  Level 0 Pressure Applied For:020 minutes Manual:   yes Patient Status During Pull:  awake Post Pull Site:  Level 0 Post Pull Instructions Given:  yes Post Pull Pulses Present: rt dp palpable Dressing Applied:  yes Bedrest begins @ 10:15 Comments:

## 2018-01-08 NOTE — Discharge Instructions (Signed)
Drink plenty of fluids for 48 hours  Femoral Site Care This sheet gives you information about how to care for yourself after your procedure. Your health care provider may also give you more specific instructions. If you have problems or questions, contact your health care provider. What can I expect after the procedure? After the procedure, it is common to have:  Bruising that usually fades within 1-2 weeks.  Tenderness at the site. Follow these instructions at home: Wound care  Follow instructions from your health care provider about how to take care of your insertion site. Make sure you: ? Wash your hands with soap and water before you change your bandage (dressing). If soap and water are not available, use hand sanitizer. ? Change your dressing as told by your health care provider. ? Leave stitches (sutures), skin glue, or adhesive strips in place. These skin closures may need to stay in place for 2 weeks or longer. If adhesive strip edges start to loosen and curl up, you may trim the loose edges. Do not remove adhesive strips completely unless your health care provider tells you to do that.  Do not take baths, swim, or use a hot tub until your health care provider approves.  You may shower 24-48 hours after the procedure or as told by your health care provider. ? Gently wash the site with plain soap and water. ? Pat the area dry with a clean towel. ? Do not rub the site. This may cause bleeding.  Do not apply powder or lotion to the site. Keep the site clean and dry.  Check your femoral site every day for signs of infection. Check for: ? Redness, swelling, or pain. ? Fluid or blood. ? Warmth. ? Pus or a bad smell. Activity  For the first 2-3 days after your procedure, or as long as directed: ? Avoid climbing stairs as much as possible. ? Do not squat.  Do not lift anything that is heavier than 10 lb (4.5 kg), or the limit that you are told, until your health care provider  says that it is safe.  Rest as directed. ? Avoid sitting for a long time without moving. Get up to take short walks every 1-2 hours.  Do not drive for 24 hours if you were given a medicine to help you relax (sedative). General instructions  Take over-the-counter and prescription medicines only as told by your health care provider.  Keep all follow-up visits as told by your health care provider. This is important. Contact a health care provider if you have:  A fever or chills.  You have redness, swelling, or pain around your insertion site. Get help right away if:  The catheter insertion area swells very fast.  You pass out.  You suddenly start to sweat or your skin gets clammy.  The catheter insertion area is bleeding, and the bleeding does not stop when you hold steady pressure on the area.  The area near or just beyond the catheter insertion site becomes pale, cool, tingly, or numb. These symptoms may represent a serious problem that is an emergency. Do not wait to see if the symptoms will go away. Get medical help right away. Call your local emergency services (911 in the [place] Do not drive yourself to the hospital. Summary  After the procedure, it is common to have bruising that usually fades within 1-2 weeks.  Check your femoral site every day for signs of infection.  Do not lift anything that is  heavier than 10 lb (4.5 kg), or the limit that you are told, until your health care provider says that it is safe. This information is not intended to replace advice given to you by your health care provider. Make sure you discuss any questions you have with your health care provider. Document Released: 08/29/2013 Document Revised: 01/08/2017 Document Reviewed: 01/08/2017 Elsevier Interactive Patient Education  2019 [employer].

## 2018-01-10 DIAGNOSIS — H18832 Recurrent erosion of cornea, left eye: Secondary | ICD-10-CM | POA: Diagnosis not present

## 2018-01-11 DIAGNOSIS — H18832 Recurrent erosion of cornea, left eye: Secondary | ICD-10-CM | POA: Diagnosis not present

## 2018-01-14 ENCOUNTER — Ambulatory Visit (INDEPENDENT_AMBULATORY_CARE_PROVIDER_SITE_OTHER): Payer: Medicare Other

## 2018-01-14 DIAGNOSIS — H18832 Recurrent erosion of cornea, left eye: Secondary | ICD-10-CM | POA: Diagnosis not present

## 2018-01-14 DIAGNOSIS — I255 Ischemic cardiomyopathy: Secondary | ICD-10-CM

## 2018-01-14 DIAGNOSIS — I5022 Chronic systolic (congestive) heart failure: Secondary | ICD-10-CM

## 2018-01-14 LAB — CUP PACEART REMOTE DEVICE CHECK
Date Time Interrogation Session: 20200107230107
Implantable Lead Implant Date: 20180703
Implantable Lead Implant Date: 20180703
Implantable Lead Implant Date: 20180703
Implantable Lead Location: 753858
Implantable Lead Location: 753859
Implantable Lead Location: 753860
Implantable Lead Model: 293
Implantable Lead Model: 7741
Implantable Lead Serial Number: 433305
Implantable Lead Serial Number: 801469
Implantable Lead Serial Number: 901025
Implantable Pulse Generator Implant Date: 20180703
MDC IDC PG SERIAL: 169965

## 2018-01-15 NOTE — Progress Notes (Signed)
Remote ICD transmission.   

## 2018-01-17 DIAGNOSIS — H18832 Recurrent erosion of cornea, left eye: Secondary | ICD-10-CM | POA: Diagnosis not present

## 2018-02-09 IMAGING — CR DG CHEST 2V
2 series · 2 of 2 positions shown · non-contrast
Comparison: 08/15/2011 and prior chest radiographs

CLINICAL DATA: ICD placement today, now with swelling over incision
site.

EXAM:
CHEST  2 VIEW

[chest lat]
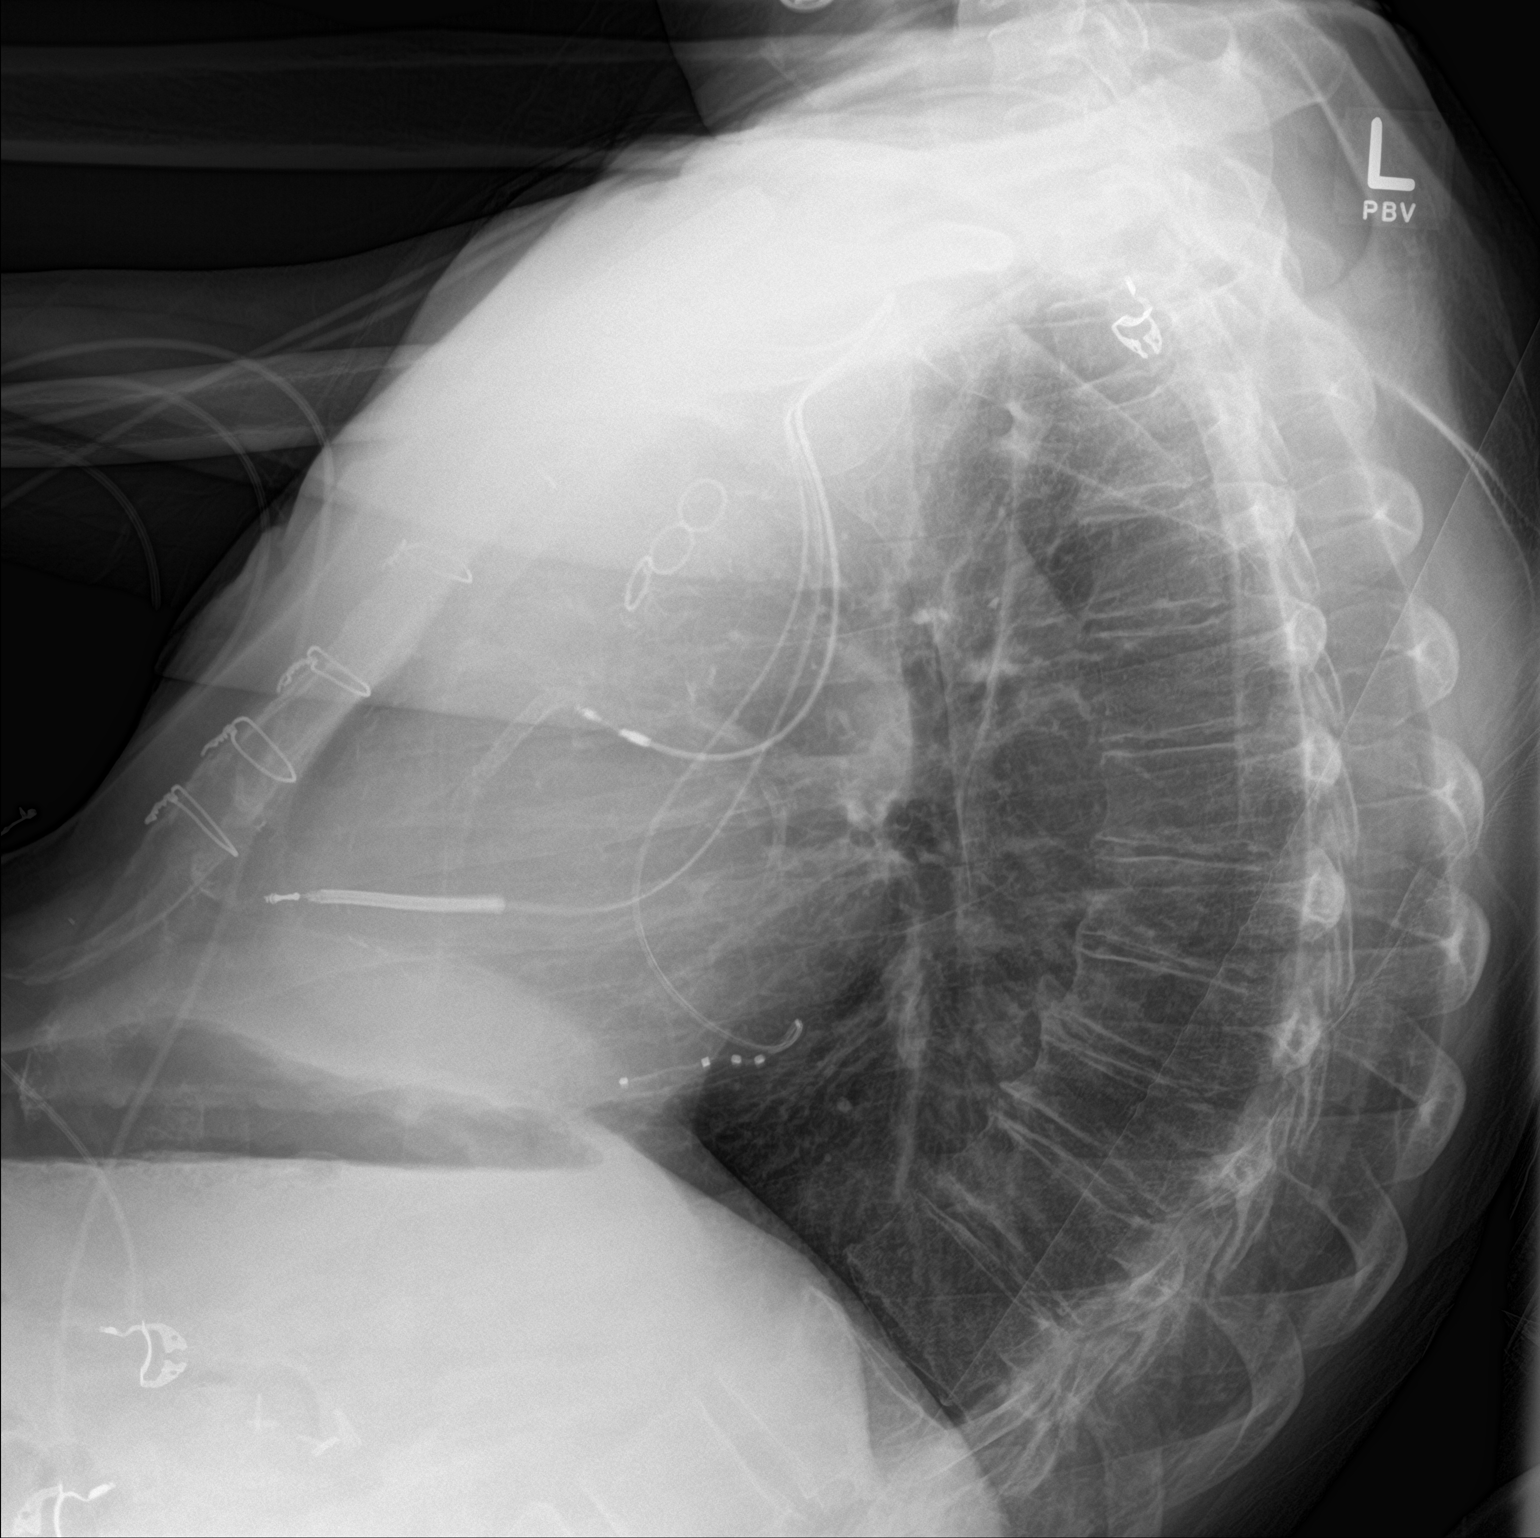

[chest ap]
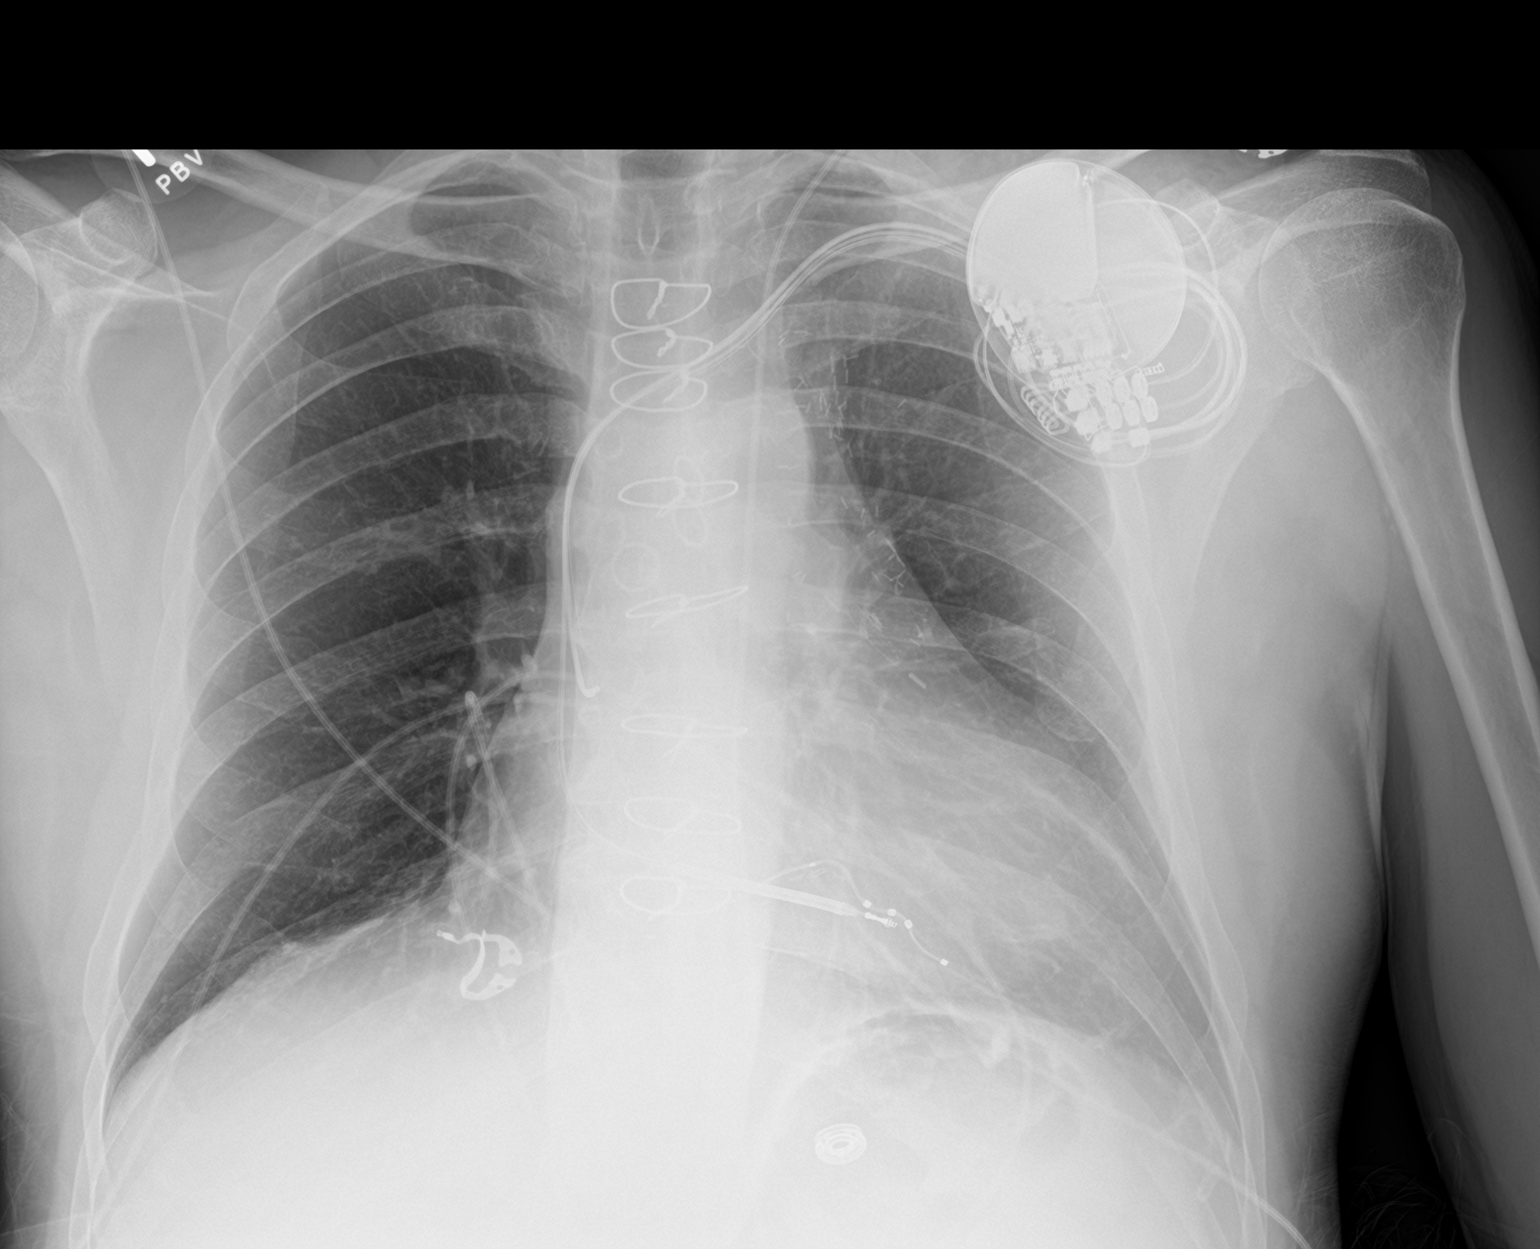

[2 of 2 positions shown; findings below may reference images not displayed]

FINDINGS: Interval placement of a left ICD noted with leads overlying the
right atrium, right ventricle and coronary sinus.

Soft tissue swelling is noted above the ICD pack.

Cardiomegaly and CABG changes again noted.

There is no evidence of focal airspace disease, pulmonary edema,
suspicious pulmonary nodule/mass, pleural effusion, or pneumothorax.

No acute bony abnormalities are identified.
IMPRESSION: Left upper chest soft tissue swelling above the ICD pack.

Cardiomegaly without evidence of pleural effusion, pneumothorax or
airspace disease.

## 2018-02-12 DIAGNOSIS — L570 Actinic keratosis: Secondary | ICD-10-CM | POA: Diagnosis not present

## 2018-02-12 DIAGNOSIS — D229 Melanocytic nevi, unspecified: Secondary | ICD-10-CM | POA: Diagnosis not present

## 2018-02-19 DIAGNOSIS — N401 Enlarged prostate with lower urinary tract symptoms: Secondary | ICD-10-CM | POA: Diagnosis not present

## 2018-02-19 DIAGNOSIS — N138 Other obstructive and reflux uropathy: Secondary | ICD-10-CM | POA: Diagnosis not present

## 2018-02-20 ENCOUNTER — Encounter: Payer: Self-pay | Admitting: Family Medicine

## 2018-02-20 ENCOUNTER — Ambulatory Visit (INDEPENDENT_AMBULATORY_CARE_PROVIDER_SITE_OTHER): Payer: Medicare Other | Admitting: Family Medicine

## 2018-02-20 ENCOUNTER — Other Ambulatory Visit (HOSPITAL_COMMUNITY): Payer: Medicare Other

## 2018-02-20 VITALS — BP 143/83 | HR 65 | Ht 72.0 in | Wt 187.0 lb

## 2018-02-20 DIAGNOSIS — M7712 Lateral epicondylitis, left elbow: Secondary | ICD-10-CM | POA: Diagnosis not present

## 2018-02-20 DIAGNOSIS — I255 Ischemic cardiomyopathy: Secondary | ICD-10-CM

## 2018-02-20 MED ORDER — NITROGLYCERIN 0.2 MG/HR TD PT24: MEDICATED_PATCH | TRANSDERMAL | 1 refills | Status: DC

## 2018-02-20 NOTE — Patient Instructions (Addendum)
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 Try to avoid painful activities when possible. Ice the area 3-4 times a day for 15 minutes at a time. Aspercreme up to 4 times a day topically. Tylenol or aleve as needed for pain but rarely use the aleve - this is the safest anti-inflammatory for you but it will increase blood pressure some. Nitro patches 1/4th patch to affected elbow, change daily Sleeve or Counterforce brace as directed can help unload area - wear this regularly if it provides you with relief. Hammer rotation exercise, wrist extension exercise with 1 pound weight - 3 sets of 10 once a day.   Stretching - hold for 20-30 seconds and repeat 3 times. Follow up in 6 weeks.

## 2018-02-21 ENCOUNTER — Encounter: Payer: Self-pay | Admitting: Family Medicine

## 2018-02-21 NOTE — Progress Notes (Signed)
PCP: Patient, No Pcp Per  Subjective:   HPI: Patient is a 68 y.o. male here for left elbow pain.  Patient reports about 6 weeks ago he woke up with left lateral elbow pain. No acute injury or trauma to cause this. He plays tennis but is right handed. Pain level up to 6-7/10 and sharp. Wakes him up at times. Tried biofreeze and ibuprofen. Radiates down forearm some. No skin changes, numbness.  Past Medical History:  Diagnosis Date  . AICD (automatic cardioverter/defibrillator) present    BIV  . Anginal pain ([place])   . Blood transfusion without reported diagnosis   . CAD (coronary artery disease)    with CABG LIMA to LAD, free radial PDA, SVG to diagonal, SVG to OM. 2002. DES May 2011 and 01-2010 to SVG of Diag, native OM, and Native RCA. Unable to stent LAD via LIMA10/18 PCI to dRCA, multi-site DES to SVG--> OM  . GERD (gastroesophageal reflux disease)   . Hyperlipidemia    unable to tolarate statin therapy  . Hypertension   . Kidney stone   . OSA on CPAP 12/13/2017   Severe obstructive sleep apnea with an AHI of 30.3/h and no significant central sleep apnea.  His oxygen saturations dropped to 83%.  He is on CPAP at 8 cm H2O.  . Vertigo     Current Outpatient Medications on File Prior to Visit  Medication Sig Dispense Refill  . Alirocumab (PRALUENT) 75 MG/ML SOPN Inject 1 pen into the skin every 14 (fourteen) days. 2 pen 11  . aspirin EC 81 MG tablet Take 81 mg by mouth daily.    . cholecalciferol (VITAMIN D3) 25 MCG (1000 UT) tablet Take 1,000 Units by mouth daily.    . clopidogrel (PLAVIX) 75 MG tablet TAKE 1 TABLET BY MOUTH EVERY DAY WITH A MEAL (Patient taking differently: Take 75 mg by mouth daily at 3 pm. ) 90 tablet 2  . isosorbide mononitrate (IMDUR) 30 MG 24 hr tablet Take 1 tablet (30 mg total) by mouth daily. 30 tablet 11  . metoprolol succinate (TOPROL-XL) 25 MG 24 hr tablet Take 1 tablet (25 mg total) by mouth daily. Take with or immediately following a meal. 90  tablet 3  . nitroGLYCERIN (NITROSTAT) 0.4 MG SL tablet Place 1 tablet (0.4 mg total) under the tongue every 5 (five) minutes as needed for chest pain (MAX 3 TABLETS). 25 tablet 3  . omeprazole (PRILOSEC) 40 MG capsule Take 1 capsule (40 mg total) by mouth daily. 90 capsule 3  . RABEprazole (ACIPHEX) 20 MG tablet Take 20 mg by mouth at bedtime.    . sacubitril-valsartan (ENTRESTO) 49-51 MG Take 1 tablet by mouth 2 (two) times daily. 60 tablet 11  . sodium chloride (MURO 128) 2 % ophthalmic solution Place 1 drop into the left eye 3 (three) times daily.     No current facility-administered medications on file prior to visit.     Past Surgical History:  Procedure Laterality Date  . BIV ICD INSERTION CRT-D N/A 07/11/2016   Procedure: BiV ICD Insertion CRT-D;  Surgeon: Evans Lance, MD;  Location: [facility];  Service: Cardiovascular;  Laterality: N/A;  . CHOLECYSTECTOMY    . COLONOSCOPY    . CORONARY ARTERY BYPASS GRAFT    . CORONARY BALLOON ANGIOPLASTY N/A 10/12/2016   Procedure: CORONARY BALLOON ANGIOPLASTY;  Surgeon: Belva Crome, MD;  Location: [facility];  Service: Cardiovascular;  Laterality: N/A;  . CORONARY STENT INTERVENTION  10/12/2016   PTCA of focal in-stent restenosis in the second obtuse marginal reducing 90% stenosis to 0%.  . CORONARY STENT INTERVENTION N/A 10/12/2016   Procedure: CORONARY STENT INTERVENTION;  Surgeon: Belva Crome, MD;  Location: [facility];  Service: Cardiovascular;  Laterality: N/A;  . LEFT HEART CATH AND CORS/GRAFTS ANGIOGRAPHY N/A 10/12/2016   Procedure: LEFT HEART CATH AND CORS/GRAFTS ANGIOGRAPHY;  Surgeon: Belva Crome, MD;  Location: [facility];  Service: Cardiovascular;  Laterality: N/A;  . LEFT HEART CATH AND CORS/GRAFTS ANGIOGRAPHY N/A 01/08/2018   Procedure: LEFT HEART CATH AND CORS/GRAFTS ANGIOGRAPHY;  Surgeon: Belva Crome, MD;  Location: [facility];  Service: Cardiovascular;  Laterality: N/A;  .  RIGHT/LEFT HEART CATH AND CORONARY/GRAFT ANGIOGRAPHY N/A 03/01/2016   Procedure: Right/Left Heart Cath and Coronary/Graft Angiography;  Surgeon: Belva Crome, MD;  Location: [facility];  Service: Cardiovascular;  Laterality: N/A;  . ULTRASOUND GUIDANCE FOR VASCULAR ACCESS  01/08/2018   Procedure: Ultrasound Guidance For Vascular Access;  Surgeon: Belva Crome, MD;  Location: [facility];  Service: Cardiovascular;;    Allergies  Allergen Reactions  . Fish Oil Other (See Comments)    FEELS BAD     Social History   Socioeconomic History  . Marital status: Married    Spouse name: Not on file  . Number of children: Not on file  . Years of education: Not on file  . Highest education level: Not on file  Occupational History  . Not on file  Social Needs  . Financial resource strain: Not on file  . Food insecurity:    Worry: Not on file    Inability: Not on file  . Transportation needs:    Medical: Not on file    Non-medical: Not on file  Tobacco Use  . Smoking status: Never Smoker  . Smokeless tobacco: Never Used  Substance and Sexual Activity  . Alcohol use: Yes    Alcohol/week: 1.0 standard drinks    Types: 1 Glasses of wine per week    Comment: RARE  . Drug use: No  . Sexual activity: Not on file  Lifestyle  . Physical activity:    Days per week: Not on file    Minutes per session: Not on file  . Stress: Not on file  Relationships  . Social connections:    Talks on phone: Not on file    Gets together: Not on file    Attends religious service: Not on file    Active member of club or organization: Not on file    Attends meetings of clubs or organizations: Not on file    Relationship status: Not on file  . Intimate partner violence:    Fear of current or ex partner: Not on file    Emotionally abused: Not on file    Physically abused: Not on file    Forced sexual activity: Not on file  Other Topics Concern  . Not on file  Social History Narrative   . Not on file    Family History  Problem Relation Age of Onset  . Colon cancer Father   . Sudden death Neg Hx   . Hypertension Neg Hx   . Hyperlipidemia Neg Hx   . Heart attack Neg Hx   . Diabetes Neg Hx   . Rectal cancer Neg Hx   . Stomach cancer Neg Hx     BP (!) 143/83   Pulse  65   Ht 6' (1.829 m)   Wt 187 lb (84.8 kg)   BMI 25.36 kg/m   Review of Systems: See HPI above.     Objective:  Physical Exam:  Gen: NAD, comfortable in exam room  Left elbow: No deformity, swelling, bruising. FROM with 5/5 strength.  Pain on resisted wrist extension and 3rd digit extension. Tenderness to palpation lateral epicondyle and just distal to this. Collateral ligaments intact. NVI distally.  Right elbow: No deformity. FROM with 5/5 strength. No tenderness to palpation. NVI distally.  MSK u/s left elbow:  Partial tear at insertion, midsubstance, of common extensor tendon.   Assessment & Plan:  1. Left lateral epicondylitis - with small partial tear of common extensor tendon.  Icing, aspercreme, tylenol, rare aleve.  Nitro patches.  Counterforce brace.  Shown home exercises to do daily.  Consider physical therapy.  F/u in 6 weeks.

## 2018-03-07 ENCOUNTER — Other Ambulatory Visit: Payer: Self-pay | Admitting: Interventional Cardiology

## 2018-03-12 ENCOUNTER — Ambulatory Visit (HOSPITAL_COMMUNITY): Payer: Medicare Other | Attending: Cardiovascular Disease

## 2018-03-12 DIAGNOSIS — I5022 Chronic systolic (congestive) heart failure: Secondary | ICD-10-CM

## 2018-03-13 ENCOUNTER — Telehealth: Payer: Self-pay

## 2018-03-13 NOTE — Telephone Encounter (Signed)
-----   Message from Belva Crome, MD sent at 03/13/2018 10:19 AM EST ----- Let the patient know ejection fraction is now near normal, in the 45% range.  This is attributable to resynchronization with his device and guideline directed therapy for systolic dysfunction.  This is very reassuring information. A copy will be sent to Patient, No Pcp Per

## 2018-03-13 NOTE — Telephone Encounter (Signed)
Pt advised his echo results and will keep OV with Dr. Tamala Julian 03/26/18.

## 2018-03-13 NOTE — Telephone Encounter (Signed)
New message   Patient is returning call about echo results per the previous message.

## 2018-03-13 NOTE — Telephone Encounter (Signed)
LMTCB

## 2018-03-21 ENCOUNTER — Telehealth: Payer: Self-pay

## 2018-03-21 ENCOUNTER — Ambulatory Visit: Payer: Medicare Other | Admitting: Interventional Cardiology

## 2018-03-21 NOTE — Telephone Encounter (Signed)
Called pt to let them know that the PASS Program approved them and gave the pt the number to call the pass program to schedule those deliveries

## 2018-03-26 ENCOUNTER — Ambulatory Visit: Payer: Medicare Other | Admitting: Interventional Cardiology

## 2018-03-29 DIAGNOSIS — Z23 Encounter for immunization: Secondary | ICD-10-CM | POA: Diagnosis not present

## 2018-04-03 ENCOUNTER — Encounter: Payer: Self-pay | Admitting: Family Medicine

## 2018-04-03 ENCOUNTER — Other Ambulatory Visit: Payer: Self-pay

## 2018-04-03 ENCOUNTER — Ambulatory Visit (INDEPENDENT_AMBULATORY_CARE_PROVIDER_SITE_OTHER): Payer: Medicare Other | Admitting: Family Medicine

## 2018-04-03 VITALS — BP 132/80 | HR 67 | Temp 97.4°F | Ht 73.0 in | Wt 187.0 lb

## 2018-04-03 DIAGNOSIS — I255 Ischemic cardiomyopathy: Secondary | ICD-10-CM | POA: Diagnosis not present

## 2018-04-03 DIAGNOSIS — M7712 Lateral epicondylitis, left elbow: Secondary | ICD-10-CM | POA: Diagnosis not present

## 2018-04-03 NOTE — Patient Instructions (Signed)
Continue your home exercises, biofreeze. Wear the wrist brace at night and as often as possible during the day. Wear bodyhelix sleeve only during the day. If not improving over the next 1-2 weeks call me and i'll send in the voltaren gel. If still not improving with that I'd recommend a steroid injection. Keep me updated over the next few weeks even if you're doing better (via mychart or phone call).

## 2018-04-03 NOTE — Progress Notes (Signed)
PCP: Patient, No Pcp Per  Subjective:   HPI: Patient is a 68 y.o. male here for left elbow pain.  2/12: Patient reports about 6 weeks ago he woke up with left lateral elbow pain. No acute injury or trauma to cause this. He plays tennis but is right handed. Pain level up to 6-7/10 and sharp. Wakes him up at times. Tried biofreeze and ibuprofen. Radiates down forearm some. No skin changes, numbness.  3/25: Patient reports he feels about the same as last visit. Couldn't find a brace that fit well. Pain is 6/10 and sharp lateral elbow. Nitro patches made him dizzy. Wakes him up at night. No numbness.  Past Medical History:  Diagnosis Date  . AICD (automatic cardioverter/defibrillator) present    BIV  . Anginal pain ([place])   . Blood transfusion without reported diagnosis   . CAD (coronary artery disease)    with CABG LIMA to LAD, free radial PDA, SVG to diagonal, SVG to OM. 2002. DES May 2011 and 01-2010 to SVG of Diag, native OM, and Native RCA. Unable to stent LAD via LIMA10/18 PCI to dRCA, multi-site DES to SVG--> OM  . GERD (gastroesophageal reflux disease)   . Hyperlipidemia    unable to tolarate statin therapy  . Hypertension   . Kidney stone   . OSA on CPAP 12/13/2017   Severe obstructive sleep apnea with an AHI of 30.3/h and no significant central sleep apnea.  His oxygen saturations dropped to 83%.  He is on CPAP at 8 cm H2O.  . Vertigo     Current Outpatient Medications on File Prior to Visit  Medication Sig Dispense Refill  . Alirocumab (PRALUENT) 75 MG/ML SOPN Inject 1 pen into the skin every 14 (fourteen) days. 2 pen 11  . aspirin EC 81 MG tablet Take 81 mg by mouth daily.    . cholecalciferol (VITAMIN D3) 25 MCG (1000 UT) tablet Take 1,000 Units by mouth daily.    . clopidogrel (PLAVIX) 75 MG tablet TAKE 1 TABLET BY MOUTH EVERY DAY WITH A MEAL 90 tablet 2  . isosorbide mononitrate (IMDUR) 30 MG 24 hr tablet Take 1 tablet (30 mg total) by mouth daily. 30 tablet  11  . metoprolol succinate (TOPROL-XL) 25 MG 24 hr tablet Take 1 tablet (25 mg total) by mouth daily. Take with or immediately following a meal. 90 tablet 3  . nitroGLYCERIN (NITRODUR - DOSED IN MG/24 HR) 0.2 mg/hr patch Apply 1/4th patch to affected area, change daily 30 patch 1  . nitroGLYCERIN (NITROSTAT) 0.4 MG SL tablet Place 1 tablet (0.4 mg total) under the tongue every 5 (five) minutes as needed for chest pain (MAX 3 TABLETS). 25 tablet 3  . omeprazole (PRILOSEC) 40 MG capsule Take 1 capsule (40 mg total) by mouth daily. 90 capsule 3  . RABEprazole (ACIPHEX) 20 MG tablet Take 20 mg by mouth at bedtime.    . sacubitril-valsartan (ENTRESTO) 49-51 MG Take 1 tablet by mouth 2 (two) times daily. 60 tablet 11  . sodium chloride (MURO 128) 2 % ophthalmic solution Place 1 drop into the left eye 3 (three) times daily.     No current facility-administered medications on file prior to visit.     Past Surgical History:  Procedure Laterality Date  . BIV ICD INSERTION CRT-D N/A 07/11/2016   Procedure: BiV ICD Insertion CRT-D;  Surgeon: Evans Lance, MD;  Location: [facility];  Service: Cardiovascular;  Laterality: N/A;  . CHOLECYSTECTOMY    .  COLONOSCOPY    . CORONARY ARTERY BYPASS GRAFT    . CORONARY BALLOON ANGIOPLASTY N/A 10/12/2016   Procedure: CORONARY BALLOON ANGIOPLASTY;  Surgeon: Belva Crome, MD;  Location: [facility];  Service: Cardiovascular;  Laterality: N/A;  . CORONARY STENT INTERVENTION  10/12/2016   PTCA of focal in-stent restenosis in the second obtuse marginal reducing 90% stenosis to 0%.  . CORONARY STENT INTERVENTION N/A 10/12/2016   Procedure: CORONARY STENT INTERVENTION;  Surgeon: Belva Crome, MD;  Location: [facility];  Service: Cardiovascular;  Laterality: N/A;  . LEFT HEART CATH AND CORS/GRAFTS ANGIOGRAPHY N/A 10/12/2016   Procedure: LEFT HEART CATH AND CORS/GRAFTS ANGIOGRAPHY;  Surgeon: Belva Crome, MD;  Location: [facility];   Service: Cardiovascular;  Laterality: N/A;  . LEFT HEART CATH AND CORS/GRAFTS ANGIOGRAPHY N/A 01/08/2018   Procedure: LEFT HEART CATH AND CORS/GRAFTS ANGIOGRAPHY;  Surgeon: Belva Crome, MD;  Location: [facility];  Service: Cardiovascular;  Laterality: N/A;  . RIGHT/LEFT HEART CATH AND CORONARY/GRAFT ANGIOGRAPHY N/A 03/01/2016   Procedure: Right/Left Heart Cath and Coronary/Graft Angiography;  Surgeon: Belva Crome, MD;  Location: [facility];  Service: Cardiovascular;  Laterality: N/A;  . ULTRASOUND GUIDANCE FOR VASCULAR ACCESS  01/08/2018   Procedure: Ultrasound Guidance For Vascular Access;  Surgeon: Belva Crome, MD;  Location: [facility];  Service: Cardiovascular;;    Allergies  Allergen Reactions  . Fish Oil Other (See Comments)    FEELS BAD     Social History   Socioeconomic History  . Marital status: Married    Spouse name: Not on file  . Number of children: Not on file  . Years of education: Not on file  . Highest education level: Not on file  Occupational History  . Not on file  Social Needs  . Financial resource strain: Not on file  . Food insecurity:    Worry: Not on file    Inability: Not on file  . Transportation needs:    Medical: Not on file    Non-medical: Not on file  Tobacco Use  . Smoking status: Never Smoker  . Smokeless tobacco: Never Used  Substance and Sexual Activity  . Alcohol use: Yes    Alcohol/week: 1.0 standard drinks    Types: 1 Glasses of wine per week    Comment: RARE  . Drug use: No  . Sexual activity: Not on file  Lifestyle  . Physical activity:    Days per week: Not on file    Minutes per session: Not on file  . Stress: Not on file  Relationships  . Social connections:    Talks on phone: Not on file    Gets together: Not on file    Attends religious service: Not on file    Active member of club or organization: Not on file    Attends meetings of clubs or organizations: Not on file    Relationship  status: Not on file  . Intimate partner violence:    Fear of current or ex partner: Not on file    Emotionally abused: Not on file    Physically abused: Not on file    Forced sexual activity: Not on file  Other Topics Concern  . Not on file  Social History Narrative  . Not on file    Family History  Problem Relation Age of Onset  . Colon cancer Father   . Sudden death Neg Hx   .  Hypertension Neg Hx   . Hyperlipidemia Neg Hx   . Heart attack Neg Hx   . Diabetes Neg Hx   . Rectal cancer Neg Hx   . Stomach cancer Neg Hx     BP 132/80   Pulse 67   Temp (!) 97.4 F (36.3 C) (Oral)   Ht 6\' 1"  (1.854 m)   Wt 187 lb (84.8 kg)   BMI 24.67 kg/m   Review of Systems: See HPI above.     Objective:  Physical Exam:  Gen: NAD, comfortable in exam room  Left elbow: No deformity, swelling, bruising. FROM with 5/5 strength but pain on wrist extension, 3rd digit extension. Tenderness to palpation lateral epicondyle. NVI distally. Collateral ligaments intact.   Assessment & Plan:  1. Left lateral epicondylitis - with small partial tear common extensor tendon.  Add wrist brace, elbow sleeve.  Home exercises.  Icing, aspercreme.  Consider adding voltaren gel if not improving.  If still not improving would try steroid injection prior to ortho referral.

## 2018-04-04 ENCOUNTER — Encounter: Payer: Self-pay | Admitting: *Deleted

## 2018-04-15 ENCOUNTER — Ambulatory Visit (INDEPENDENT_AMBULATORY_CARE_PROVIDER_SITE_OTHER): Payer: Medicare Other | Admitting: *Deleted

## 2018-04-15 ENCOUNTER — Other Ambulatory Visit: Payer: Self-pay

## 2018-04-15 DIAGNOSIS — I255 Ischemic cardiomyopathy: Secondary | ICD-10-CM | POA: Diagnosis not present

## 2018-04-15 LAB — CUP PACEART REMOTE DEVICE CHECK
Date Time Interrogation Session: 20200406171232
Implantable Lead Implant Date: 20180703
Implantable Lead Implant Date: 20180703
Implantable Lead Implant Date: 20180703
Implantable Lead Location: 753858
Implantable Lead Location: 753859
Implantable Lead Location: 753860
Implantable Lead Model: 293
Implantable Lead Model: 4674
Implantable Lead Model: 7741
Implantable Lead Serial Number: 433305
Implantable Lead Serial Number: 801469
Implantable Lead Serial Number: 901025
Implantable Pulse Generator Implant Date: 20180703
Pulse Gen Serial Number: 169965

## 2018-04-23 ENCOUNTER — Encounter: Payer: Self-pay | Admitting: Cardiology

## 2018-04-23 NOTE — Progress Notes (Signed)
Remote ICD transmission.   

## 2018-04-29 DIAGNOSIS — N529 Male erectile dysfunction, unspecified: Secondary | ICD-10-CM | POA: Diagnosis not present

## 2018-04-29 DIAGNOSIS — N401 Enlarged prostate with lower urinary tract symptoms: Secondary | ICD-10-CM | POA: Diagnosis not present

## 2018-04-29 DIAGNOSIS — N138 Other obstructive and reflux uropathy: Secondary | ICD-10-CM | POA: Diagnosis not present

## 2018-04-29 DIAGNOSIS — Z87442 Personal history of urinary calculi: Secondary | ICD-10-CM | POA: Diagnosis not present

## 2018-05-01 DIAGNOSIS — R972 Elevated prostate specific antigen [PSA]: Secondary | ICD-10-CM | POA: Diagnosis not present

## 2018-05-03 ENCOUNTER — Telehealth: Payer: Self-pay | Admitting: Interventional Cardiology

## 2018-05-03 NOTE — Telephone Encounter (Signed)
F/U Message            Patient is returning call and would like a call back, same #

## 2018-05-03 NOTE — Telephone Encounter (Signed)
Left message for patient to call back regarding appt for May 5, need to switch appointment to a virtual visit

## 2018-05-03 NOTE — Telephone Encounter (Signed)
Patient set up for MyChart? YES CONSENT SENT THROUGH MY CHART  Is patient using Smartphone/computer/tablet?SMART  Did audio/video work?  Does patient need telephone visit NO  Best phone number to use? [phone]

## 2018-05-13 NOTE — Progress Notes (Signed)
Virtual Visit via Video Note   This visit type was conducted due to national recommendations for restrictions regarding the COVID-19 Pandemic (e.g. social distancing) in an effort to limit this patient's exposure and mitigate transmission in our community.  Due to his co-morbid illnesses, this patient is at least at moderate risk for complications without adequate follow up.  This format is felt to be most appropriate for this patient at this time.  All issues noted in this document were discussed and addressed.  A limited physical exam was performed with this format.  Please refer to the patient's chart for his consent to telehealth for [facility].   Date:  05/14/2018   ID:  [name], DOB 1950-04-27, MRN [id number]  Patient Location: Home Provider Location: Office  PCP:  Patient, No Pcp Per  Cardiologist:  Sinclair Grooms, MD  Electrophysiologist:  None   Evaluation Performed:  Follow-Up Visit  Chief Complaint:  CAD/CHF  History of Present Illness:    Tommy [name] is a 68 y.o. male with hx of ischemic cardiomyopathy, exertional angina pectoris, prior coronary bypass grafting 2002 with bypass graft failure and multiple native and bypass graft stents most recently October 2018, CRT-D 2018, hypertension, hyperlipidemia and chronic systolic heart failure, AICD, brief atrial fibrillation on telemetry, and OSA.  He is having muscle soreness. He stopped taking Imdur because of low BP. No NTG use. He denies dyspnea.   The patient does not have symptoms concerning for COVID-19 infection (fever, chills, cough, or new shortness of breath).    Past Medical History:  Diagnosis Date  . AICD (automatic cardioverter/defibrillator) present    BIV  . Anginal pain ([place])   . Blood transfusion without reported diagnosis   . CAD (coronary artery disease)    with CABG LIMA to LAD, free radial PDA, SVG to diagonal, SVG to OM. 2002. DES May 2011 and 01-2010 to SVG of Diag, native OM, and  Native RCA. Unable to stent LAD via LIMA10/18 PCI to dRCA, multi-site DES to SVG--> OM  . GERD (gastroesophageal reflux disease)   . Hyperlipidemia    unable to tolarate statin therapy  . Hypertension   . Kidney stone   . OSA on CPAP 12/13/2017   Severe obstructive sleep apnea with an AHI of 30.3/h and no significant central sleep apnea.  His oxygen saturations dropped to 83%.  He is on CPAP at 8 cm H2O.  . Vertigo    Past Surgical History:  Procedure Laterality Date  . BIV ICD INSERTION CRT-D N/A 07/11/2016   Procedure: BiV ICD Insertion CRT-D;  Surgeon: Evans Lance, MD;  Location: [place] [facility];  Service: Cardiovascular;  Laterality: N/A;  . CHOLECYSTECTOMY    . COLONOSCOPY    . CORONARY ARTERY BYPASS GRAFT    . CORONARY BALLOON ANGIOPLASTY N/A 10/12/2016   Procedure: CORONARY BALLOON ANGIOPLASTY;  Surgeon: Belva Crome, MD;  Location: [place] [facility];  Service: Cardiovascular;  Laterality: N/A;  . CORONARY STENT INTERVENTION  10/12/2016   PTCA of focal in-stent restenosis in the second obtuse marginal reducing 90% stenosis to 0%.  . CORONARY STENT INTERVENTION N/A 10/12/2016   Procedure: CORONARY STENT INTERVENTION;  Surgeon: Belva Crome, MD;  Location: [place] [facility];  Service: Cardiovascular;  Laterality: N/A;  . LEFT HEART CATH AND CORS/GRAFTS ANGIOGRAPHY N/A 10/12/2016   Procedure: LEFT HEART CATH AND CORS/GRAFTS ANGIOGRAPHY;  Surgeon: Belva Crome, MD;  Location: [place] [facility];  Service: Cardiovascular;  Laterality: N/A;  . LEFT HEART CATH AND CORS/GRAFTS ANGIOGRAPHY N/A 01/08/2018   Procedure: LEFT HEART CATH AND CORS/GRAFTS ANGIOGRAPHY;  Surgeon: Belva Crome, MD;  Location: [place] [facility];  Service: Cardiovascular;  Laterality: N/A;  . RIGHT/LEFT HEART CATH AND CORONARY/GRAFT ANGIOGRAPHY N/A 03/01/2016   Procedure: Right/Left Heart Cath and Coronary/Graft Angiography;  Surgeon: Belva Crome, MD;  Location: [place] [facility];  Service:  Cardiovascular;  Laterality: N/A;  . ULTRASOUND GUIDANCE FOR VASCULAR ACCESS  01/08/2018   Procedure: Ultrasound Guidance For Vascular Access;  Surgeon: Belva Crome, MD;  Location: [place] [facility];  Service: Cardiovascular;;     Current Meds  Medication Sig  . Alirocumab (PRALUENT) 75 MG/ML SOPN Inject 1 pen into the skin every 14 (fourteen) days.  Marland Kitchen aspirin EC 81 MG tablet Take 81 mg by mouth daily.  . cholecalciferol (VITAMIN D3) 25 MCG (1000 UT) tablet Take 1,000 Units by mouth daily.  . clopidogrel (PLAVIX) 75 MG tablet TAKE 1 TABLET BY MOUTH EVERY DAY WITH A MEAL  . metoprolol succinate (TOPROL-XL) 25 MG 24 hr tablet Take 1 tablet (25 mg total) by mouth daily. Take with or immediately following a meal.  . nitroGLYCERIN (NITROSTAT) 0.4 MG SL tablet Place 1 tablet (0.4 mg total) under the tongue every 5 (five) minutes as needed for chest pain (MAX 3 TABLETS).  Marland Kitchen omeprazole (PRILOSEC) 40 MG capsule Take 1 capsule (40 mg total) by mouth daily.  . sacubitril-valsartan (ENTRESTO) 49-51 MG Take 1 tablet by mouth 2 (two) times daily.  . sodium chloride (MURO 128) 2 % ophthalmic solution Place 1 drop into the left eye 3 (three) times daily.     Allergies:   Fish oil   Social History   Tobacco Use  . Smoking status: Never Smoker  . Smokeless tobacco: Never Used  Substance Use Topics  . Alcohol use: Yes    Alcohol/week: 1.0 standard drinks    Types: 1 Glasses of wine per week    Comment: RARE  . Drug use: No     Family Hx: The patient's family history includes Colon cancer in his father. There is no history of Sudden death, Hypertension, Hyperlipidemia, Heart attack, Diabetes, Rectal cancer, or Stomach cancer.  ROS:   Please see the history of present illness.    Facial rash and muscle soreness. All other systems reviewed and are negative.   Prior CV studies:   The following studies were reviewed today:  ECHOCARDIOGRAM 03/2018: IMPRESSIONS    1. The left ventricle  has mild-moderately reduced systolic function, with an ejection fraction of 40-45%. The cavity size was normal. Left ventricular diastolic Doppler parameters are consistent with impaired relaxation  2. Left ventrical global hypokinesis without regional wall motion abnormalities.  3. Mildly reduced right ventricular systolic function.  4. Left atrial size was mildly dilated.  5. When compared to the prior study: Compared to 02/22/2016, there is improved synchrony and improved overall LV systolic function.  Labs/Other Tests and Data Reviewed:    EKG:  No ECG reviewed.  Recent Labs: 10/09/2017: ALT 32 01/07/2018: BUN 14; Creatinine, Ser 1.00; Hemoglobin 14.6; Platelets 323; Potassium 4.6; Sodium 143   Recent Lipid Panel Lab Results  Component Value Date/Time   CHOL 107 10/09/2017 08:05 AM   TRIG 174 (H) 10/09/2017 08:05 AM   HDL 40 10/09/2017 08:05 AM   CHOLHDL 2.7 10/09/2017 08:05 AM   CHOLHDL 3.3 03/03/2015 07:44 AM   LDLCALC 32 10/09/2017  08:05 AM   LDLDIRECT 81 10/24/2016 11:19 AM   LDLDIRECT 91.0 02/05/2014 08:09 AM    Wt Readings from Last 3 Encounters:  05/14/18 189 lb (85.7 kg)  04/03/18 187 lb (84.8 kg)  02/20/18 187 lb (84.8 kg)     Objective:    Vital Signs:  BP 128/78   Pulse 60   Temp 98.1 F (36.7 C)   Ht '6\' 1"'  (1.854 m)   Wt 189 lb (85.7 kg)   BMI 24.94 kg/m    VITAL SIGNS:  reviewed GEN:  no acute distress RESPIRATORY:  normal respiratory effort, symmetric expansion CARDIOVASCULAR:  no peripheral edema NEURO:  alert and oriented x 3, no obvious focal deficit  Erythematous facial rash  ASSESSMENT & PLAN:    1. Chronic systolic heart failure ([place])   2. Coronary artery disease involving coronary bypass graft of native heart with angina pectoris ([place])   3. HYPERTENSION, BENIGN SYSTEMIC   4. Paroxysmal atrial fibrillation ([facility])   5. Biventricular automatic implantable cardioverter defibrillator in situ   6. Mixed hyperlipidemia   7. Severe obstructive  sleep apnea    PLAN;  1. Improved LV function with EF now 40 to 45% by echo done in March.  Continue guideline directed therapy. 2. Secondary prevention discussed.  LDL is in the mid 30s on Praluent.  We need to consider adding icosapent to [place] acid as triglycerides have generally run greater than 175 over the last several years. 3. Blood pressure was relatively low on isosorbide and therefore it has been discontinued.  No chest discomfort. 4. Low burden of atrial fibrillation, less than 1%.  No indication yet for anticoagulation. 5. Being monitored and with normal function. 6. Consider icosapent ethyl 2 g twice daily 7. Now compliant with CPAP and notes a dramatic improvement in overall energy and level of alertness.  Overall education and awareness concerning primary/secondary risk prevention was discussed in detail: LDL less than 70, hemoglobin A1c less than 7, blood pressure target less than 130/80 mmHg, >150 minutes of moderate aerobic activity per week, avoidance of smoking, weight control (via diet and exercise), and continued surveillance/management of/for obstructive sleep apnea.  He needs blood work (C met, lipid panel, and CPK) now and will also need an office visit in 6 months.   We will touch base with the lipid clinic, Mrs. Supple about the addition of icosapent.  COVID-19 Education: The signs and symptoms of COVID-19 were discussed with the patient and how to seek care for testing (follow up with PCP or arrange E-visit).  The importance of social distancing was discussed today.  Time:   Today, I have spent 20 minutes with the patient with telehealth technology discussing the above problems.     Medication Adjustments/Labs and Tests Ordered: Current medicines are reviewed at length with the patient today.  Concerns regarding medicines are outlined above.   Tests Ordered: Orders Placed This Encounter  Procedures  . Basic metabolic panel  . Hepatic function panel  .  Lipid panel  . Lipoprotein A (LPA)    Medication Changes: No orders of the defined types were placed in this encounter.   Disposition:  Follow up in 6 month(s)  Signed, Sinclair Grooms, MD  05/14/2018 9:30 AM    [place]

## 2018-05-14 ENCOUNTER — Other Ambulatory Visit: Payer: Self-pay

## 2018-05-14 ENCOUNTER — Telehealth (INDEPENDENT_AMBULATORY_CARE_PROVIDER_SITE_OTHER): Payer: Medicare Other | Admitting: Interventional Cardiology

## 2018-05-14 ENCOUNTER — Telehealth: Payer: Self-pay | Admitting: Pharmacist

## 2018-05-14 ENCOUNTER — Encounter: Payer: Self-pay | Admitting: Interventional Cardiology

## 2018-05-14 VITALS — BP 128/78 | HR 60 | Temp 98.1°F | Ht 73.0 in | Wt 189.0 lb

## 2018-05-14 DIAGNOSIS — E782 Mixed hyperlipidemia: Secondary | ICD-10-CM

## 2018-05-14 DIAGNOSIS — G4733 Obstructive sleep apnea (adult) (pediatric): Secondary | ICD-10-CM

## 2018-05-14 DIAGNOSIS — Z95810 Presence of automatic (implantable) cardiac defibrillator: Secondary | ICD-10-CM

## 2018-05-14 DIAGNOSIS — I5022 Chronic systolic (congestive) heart failure: Secondary | ICD-10-CM

## 2018-05-14 DIAGNOSIS — I480 Paroxysmal atrial fibrillation: Secondary | ICD-10-CM

## 2018-05-14 DIAGNOSIS — I25709 Atherosclerosis of coronary artery bypass graft(s), unspecified, with unspecified angina pectoris: Secondary | ICD-10-CM

## 2018-05-14 DIAGNOSIS — I10 Essential (primary) hypertension: Secondary | ICD-10-CM

## 2018-05-14 MED ORDER — ICOSAPENT ETHYL 1 G PO CAPS: 2.0000 | ORAL_CAPSULE | Freq: Two times a day (BID) | ORAL | 5 refills | Status: DC

## 2018-05-14 NOTE — Telephone Encounter (Signed)
-----   Message from Belva Crome, MD sent at 05/14/2018  9:19 AM EDT ----- Regarding: Elevated TG Z[place] is high risk for vascular events.  I noticed that generally speaking the triglyceride levels running greater than 160.  They have been as high as the 300s in the past 4 years.  I would like to add VASCEPA 2 g twice daily if possible.  I want to make sure that this therapy along with PCSK9 is compatible and also determine if there is a program that may help to decrease cost to the patient?

## 2018-05-14 NOTE — Telephone Encounter (Signed)
Sent in rx for Vascepa, copay prohibitive at $152.56/month. Called pt and provided him with information for [place] to see if he qualifies for additional assistance.

## 2018-05-14 NOTE — Addendum Note (Signed)
Addended by: Loren Racer on: 05/14/2018 09:34 AM   Modules accepted: Orders

## 2018-05-14 NOTE — Patient Instructions (Signed)
Medication Instructions:  Your physician recommends that you continue on your current medications as directed. Please refer to the Current Medication list given to you today.  If you need a refill on your cardiac medications before your next appointment, please call your pharmacy.   Lab work: Your physician recommends that you return for lab work this summer when it is safe from the virus. I will contact you to schedule this once we are cleared to do so.   If you have labs (blood work) drawn today and your tests are completely normal, you will receive your results only by: Marland Kitchen MyChart Message (if you have MyChart) OR . A paper copy in the mail If you have any lab test that is abnormal or we need to change your treatment, we will call you to review the results.  Testing/Procedures: None  Follow-Up: At [facility], you and your health needs are our priority.  As part of our continuing mission to provide you with exceptional heart care, we have created designated Provider Care Teams.  These Care Teams include your primary Cardiologist (physician) and Advanced Practice Providers (APPs -  Physician Assistants and Nurse Practitioners) who all work together to provide you with the care you need, when you need it. You will need a follow up appointment in 6 months.  Please call our office 2 months in advance to schedule this appointment.  You may see Sinclair Grooms, MD or one of the following Advanced Practice Providers on your designated Care Team:   Truitt Merle, NP Cecilie Kicks, NP . Kathyrn Drown, NP  Any Other Special Instructions Will Be Listed Below (If Applicable).

## 2018-05-21 DIAGNOSIS — N138 Other obstructive and reflux uropathy: Secondary | ICD-10-CM | POA: Diagnosis not present

## 2018-05-21 DIAGNOSIS — Z79899 Other long term (current) drug therapy: Secondary | ICD-10-CM | POA: Diagnosis not present

## 2018-05-21 DIAGNOSIS — N400 Enlarged prostate without lower urinary tract symptoms: Secondary | ICD-10-CM | POA: Diagnosis not present

## 2018-05-21 DIAGNOSIS — N401 Enlarged prostate with lower urinary tract symptoms: Secondary | ICD-10-CM | POA: Diagnosis not present

## 2018-05-21 DIAGNOSIS — D400 Neoplasm of uncertain behavior of prostate: Secondary | ICD-10-CM | POA: Diagnosis not present

## 2018-05-21 DIAGNOSIS — R972 Elevated prostate specific antigen [PSA]: Secondary | ICD-10-CM | POA: Diagnosis not present

## 2018-05-22 ENCOUNTER — Telehealth: Payer: Self-pay | Admitting: Pharmacist

## 2018-05-22 NOTE — Telephone Encounter (Signed)
Returned call to pt and left message

## 2018-05-22 NOTE — Telephone Encounter (Signed)
Spoke with patient to update him on [employer] status and that we will follow up with him after a response from [place]. Patient communicated understanding.

## 2018-05-22 NOTE — Telephone Encounter (Signed)
[place] and was redirected to their website or email as they are operating under limited hours duringn COVID-19 pandemic and are not taking phone calls. Looks as though we can apply for pt online, however an account is required and we need to email them for an account number first. This has been done. Will await response from [place].

## 2018-05-22 NOTE — Telephone Encounter (Signed)
Pt called to report that he called [employer] and was directed to send message via email to obtain ID. He states he has emailed them 3 times over the last 2 weeks with no response. He is wondering if there is an alternate to obtain help with medication. Advised that at this time there is no alternative to [facility] for this medication.   Will investigate if [employer] is back logged and let pt know when able. He is aware we may not be able to call until tomorrow or later this week.

## 2018-05-22 NOTE — Telephone Encounter (Signed)
Patient called today stating he has some additional questions and would like to speak to you.

## 2018-05-31 NOTE — Telephone Encounter (Signed)
Pt aware we are still waiting return email from [facility] for an account # so that we can submit Vascepa pt assistance online.

## 2018-06-13 NOTE — Telephone Encounter (Signed)
Submitted application through [employer] that was approved immediately for $2500 through 05/13/2019. Info provided to pharmacy as follows: ID: [id number] GRP: [id number] PCN: [id number] BIN: [id number] Confirmed copay is now $0 at pharmacy and pt is aware.

## 2018-06-14 DIAGNOSIS — R972 Elevated prostate specific antigen [PSA]: Secondary | ICD-10-CM | POA: Diagnosis not present

## 2018-07-15 ENCOUNTER — Ambulatory Visit (INDEPENDENT_AMBULATORY_CARE_PROVIDER_SITE_OTHER): Payer: Medicare Other | Admitting: *Deleted

## 2018-07-15 DIAGNOSIS — I255 Ischemic cardiomyopathy: Secondary | ICD-10-CM

## 2018-07-15 LAB — CUP PACEART REMOTE DEVICE CHECK
Date Time Interrogation Session: 20200706153944
Implantable Lead Implant Date: 20180703
Implantable Lead Implant Date: 20180703
Implantable Lead Implant Date: 20180703
Implantable Lead Location: 753858
Implantable Lead Location: 753859
Implantable Lead Location: 753860
Implantable Lead Model: 293
Implantable Lead Model: 4674
Implantable Lead Model: 7741
Implantable Lead Serial Number: 433305
Implantable Lead Serial Number: 801469
Implantable Lead Serial Number: 901025
Implantable Pulse Generator Implant Date: 20180703
Pulse Gen Serial Number: 169965

## 2018-07-22 ENCOUNTER — Encounter: Payer: Self-pay | Admitting: Cardiology

## 2018-07-22 NOTE — Progress Notes (Signed)
Remote ICD transmission.   

## 2018-08-06 ENCOUNTER — Telehealth: Payer: Self-pay | Admitting: Pharmacist

## 2018-08-06 NOTE — Telephone Encounter (Signed)
Pt called pharmacy line with a question about his medication costs - reports he went to pick up his [name] which is usually $20/month and the copay was >$100. Advised pt that it sounds as though he is in the donut hole - he does take multiple brand name medications including Entresto, Praluent, and Vascepa. I have already enrolled pt in the [facility] that will cover his Praluent and Coin. Will route to [place] to see if pt may qualify for copay assistance for Entresto while in the donut hole - his household income is $49,000 for 2 family members.

## 2018-08-07 NOTE — Telephone Encounter (Signed)
I called the pt to discuss and gave him the phone number [phone]) to reach [employer] pt asst [employer]. He states that he will call them with questions and to request that they mail him an application if he is eligible.

## 2018-08-10 ENCOUNTER — Other Ambulatory Visit: Payer: Self-pay | Admitting: Family Medicine

## 2018-08-20 ENCOUNTER — Other Ambulatory Visit: Payer: Self-pay | Admitting: Family Medicine

## 2018-08-20 MED ORDER — NITROGLYCERIN 0.2 MG/HR TD PT24: MEDICATED_PATCH | TRANSDERMAL | 1 refills | Status: DC

## 2018-08-20 NOTE — Progress Notes (Signed)
Refill on nitro patches.

## 2018-08-27 DIAGNOSIS — N400 Enlarged prostate without lower urinary tract symptoms: Secondary | ICD-10-CM | POA: Diagnosis not present

## 2018-08-29 ENCOUNTER — Other Ambulatory Visit: Payer: Medicare Other | Admitting: *Deleted

## 2018-08-29 ENCOUNTER — Other Ambulatory Visit: Payer: Self-pay

## 2018-08-29 DIAGNOSIS — I25709 Atherosclerosis of coronary artery bypass graft(s), unspecified, with unspecified angina pectoris: Secondary | ICD-10-CM | POA: Diagnosis not present

## 2018-08-29 DIAGNOSIS — I5022 Chronic systolic (congestive) heart failure: Secondary | ICD-10-CM | POA: Diagnosis not present

## 2018-08-29 DIAGNOSIS — I480 Paroxysmal atrial fibrillation: Secondary | ICD-10-CM | POA: Diagnosis not present

## 2018-08-29 DIAGNOSIS — E782 Mixed hyperlipidemia: Secondary | ICD-10-CM

## 2018-09-03 LAB — HEPATIC FUNCTION PANEL
ALT: 31 IU/L (ref 0–44)
AST: 23 IU/L (ref 0–40)
Albumin: 4.7 g/dL (ref 3.8–4.8)
Alkaline Phosphatase: 71 IU/L (ref 39–117)
Bilirubin Total: 0.5 mg/dL (ref 0.0–1.2)
Bilirubin, Direct: 0.16 mg/dL (ref 0.00–0.40)
Total Protein: 6.7 g/dL (ref 6.0–8.5)

## 2018-09-03 LAB — LIPOPROTEIN A (LPA): Lipoprotein (a): 62.9 nmol/L (ref <75.0)

## 2018-09-03 LAB — BASIC METABOLIC PANEL
BUN/Creatinine Ratio: 15 (ref 10–24)
BUN: 17 mg/dL (ref 8–27)
CO2: 21 mmol/L (ref 20–29)
Calcium: 9.8 mg/dL (ref 8.6–10.2)
Chloride: 101 mmol/L (ref 96–106)
Creatinine, Ser: 1.1 mg/dL (ref 0.76–1.27)
GFR calc Af Amer: 79 mL/min/{1.73_m2} (ref >59)
GFR calc non Af Amer: 69 mL/min/{1.73_m2} (ref >59)
Glucose: 112 mg/dL — ABNORMAL HIGH (ref 65–99)
Potassium: 4.4 mmol/L (ref 3.5–5.2)
Sodium: 138 mmol/L (ref 134–144)

## 2018-09-03 LAB — LIPID PANEL
Chol/HDL Ratio: 2.7 ratio (ref 0.0–5.0)
Cholesterol, Total: 118 mg/dL (ref 100–199)
HDL: 44 mg/dL (ref >39)
LDL Calculated: 47 mg/dL (ref 0–99)
Triglycerides: 136 mg/dL (ref 0–149)
VLDL Cholesterol Cal: 27 mg/dL (ref 5–40)

## 2018-09-03 LAB — CK: Total CK: 139 U/L (ref 41–331)

## 2018-09-19 ENCOUNTER — Telehealth: Payer: Self-pay | Admitting: Emergency Medicine

## 2018-09-19 DIAGNOSIS — I480 Paroxysmal atrial fibrillation: Secondary | ICD-10-CM

## 2018-09-19 NOTE — Telephone Encounter (Signed)
Patient  has hx of AT and short episodes of AFL/AF, No OAC. Received alert for episode of AF with A-rate of 375 bpm and controlled v-rate with ave of 63 bpm that occurred 9/920 at 01:30 and lasted 50 minutes. Do you want to add [place] or just continue to watch?

## 2018-09-19 NOTE — Telephone Encounter (Signed)
Duplicate

## 2018-09-25 ENCOUNTER — Other Ambulatory Visit: Payer: Self-pay | Admitting: Interventional Cardiology

## 2018-09-26 NOTE — Telephone Encounter (Signed)
Stroke risk is high. Start eliquis 5 bid

## 2018-09-27 MED ORDER — APIXABAN 5 MG PO TABS: 5.0000 mg | ORAL_TABLET | Freq: Two times a day (BID) | ORAL | 11 refills | Status: DC

## 2018-09-27 MED ORDER — APIXABAN 2.5 MG PO TABS: 5.0000 mg | ORAL_TABLET | Freq: Two times a day (BID) | ORAL | Status: DC

## 2018-09-27 NOTE — Addendum Note (Signed)
Addended by: Drake Leach on: 09/27/2018 04:35 PM   Modules accepted: Orders

## 2018-09-27 NOTE — Telephone Encounter (Signed)
Patient given 1 month suppli of Eliquis samples and 30 day free trial card. Will hold pASA and Plavix until receive clarification to continue Plavix with Eliquis.

## 2018-09-27 NOTE — Addendum Note (Signed)
Addended by: Drake Leach on: 09/27/2018 12:04 PM   Modules accepted: Orders

## 2018-09-27 NOTE — Telephone Encounter (Signed)
Patient informed to start Eliquis 5 mg bid per Dr Lovena Le. Is agreeable to being enrolled in AF clinic. Instructed to D/C ASA and will clarify with Dr Lovena Le if he wants to d/c Plavix at this time with start of Eliquis.

## 2018-09-28 NOTE — Telephone Encounter (Signed)
Stop plavix to decrease bleeding risk

## 2018-09-30 NOTE — Telephone Encounter (Signed)
Patient instructed to discontinue his Plavix per Dr Tamala Julian due to the start of Eliquis. Questions answered.

## 2018-10-10 ENCOUNTER — Encounter (HOSPITAL_COMMUNITY): Payer: Self-pay | Admitting: Physician Assistant

## 2018-10-10 ENCOUNTER — Other Ambulatory Visit: Payer: Self-pay

## 2018-10-10 ENCOUNTER — Ambulatory Visit (HOSPITAL_COMMUNITY)
Admission: RE | Admit: 2018-10-10 | Discharge: 2018-10-10 | Disposition: A | Discharge status: Home / Self Care | Payer: Medicare Other | Source: Ambulatory Visit | Attending: Physician Assistant | Admitting: Physician Assistant

## 2018-10-10 VITALS — BP 146/80 | HR 77 | Ht 73.0 in | Wt 195.0 lb

## 2018-10-10 DIAGNOSIS — I110 Hypertensive heart disease with heart failure: Secondary | ICD-10-CM | POA: Diagnosis not present

## 2018-10-10 DIAGNOSIS — I480 Paroxysmal atrial fibrillation: Secondary | ICD-10-CM | POA: Diagnosis not present

## 2018-10-10 DIAGNOSIS — K219 Gastro-esophageal reflux disease without esophagitis: Secondary | ICD-10-CM | POA: Insufficient documentation

## 2018-10-10 DIAGNOSIS — Z7901 Long term (current) use of anticoagulants: Secondary | ICD-10-CM | POA: Insufficient documentation

## 2018-10-10 DIAGNOSIS — Z951 Presence of aortocoronary bypass graft: Secondary | ICD-10-CM | POA: Insufficient documentation

## 2018-10-10 DIAGNOSIS — Z95810 Presence of automatic (implantable) cardiac defibrillator: Secondary | ICD-10-CM | POA: Insufficient documentation

## 2018-10-10 DIAGNOSIS — Z79899 Other long term (current) drug therapy: Secondary | ICD-10-CM | POA: Diagnosis not present

## 2018-10-10 DIAGNOSIS — I2510 Atherosclerotic heart disease of native coronary artery without angina pectoris: Secondary | ICD-10-CM | POA: Diagnosis not present

## 2018-10-10 DIAGNOSIS — G4733 Obstructive sleep apnea (adult) (pediatric): Secondary | ICD-10-CM | POA: Insufficient documentation

## 2018-10-10 DIAGNOSIS — E785 Hyperlipidemia, unspecified: Secondary | ICD-10-CM | POA: Diagnosis not present

## 2018-10-10 DIAGNOSIS — I255 Ischemic cardiomyopathy: Secondary | ICD-10-CM | POA: Diagnosis not present

## 2018-10-10 DIAGNOSIS — I5022 Chronic systolic (congestive) heart failure: Secondary | ICD-10-CM | POA: Diagnosis not present

## 2018-10-10 LAB — CBC
HCT: 43.8 % (ref 39.0–52.0)
Hemoglobin: 14.6 g/dL (ref 13.0–17.0)
MCH: 30.5 pg (ref 26.0–34.0)
MCHC: 33.3 g/dL (ref 30.0–36.0)
MCV: 91.6 fL (ref 80.0–100.0)
Platelets: 326 10*3/uL (ref 150–400)
RBC: 4.78 MIL/uL (ref 4.22–5.81)
RDW: 11.9 % (ref 11.5–15.5)
WBC: 6.9 10*3/uL (ref 4.0–10.5)
nRBC: 0 % (ref 0.0–0.2)

## 2018-10-10 NOTE — Progress Notes (Signed)
Primary Care Physician: Patient, No Pcp Per Primary Cardiologist: Dr Tamala Julian Primary Electrophysiologist: Dr Lovena Le Referring Physician: Dr Earley Favor is a 68 y.o. male with a history of ischemic cardiomyopathy, prior coronary bypass grafting 2002 with bypass graft failure and multiple native and bypass graft stents most recently October 2018, CRT-D 2018, hypertension, hyperlipidemia and chronic systolic heart failure,AICD,paroxysmal atrial fibrillation, and OSA who presents for follow up in the [facility].  The patient previously was not on any OAC due to the brief and infrequent nature of his afib episodes. On 09/19/18, device clinic received an alert that he had a 50 minute episode of rate controlled afib. He was asymptomatic. He was started on Eliquis 5 mg BID and his Plavix was stopped. He does have a diagnosis of OSA and is on CPAP. He denies significant alcohol use.  Today, he denies symptoms of palpitations, chest pain, shortness of breath, orthopnea, PND, lower extremity edema, dizziness, presyncope, syncope, snoring, daytime somnolence, bleeding, or neurologic sequela. The patient is tolerating medications without difficulties and is otherwise without complaint today.    Atrial Fibrillation Risk Factors:  he does have symptoms or diagnosis of sleep apnea. he is compliant with CPAP therapy. he does not have a history of rheumatic fever. he does not have a history of alcohol use. The patient does not have a history of early familial atrial fibrillation or other arrhythmias.  he has a BMI of There is no height or weight on file to calculate BMI.. There were no vitals filed for this visit.  Family History  Problem Relation Age of Onset  . Colon cancer Father   . Sudden death Neg Hx   . Hypertension Neg Hx   . Hyperlipidemia Neg Hx   . Heart attack Neg Hx   . Diabetes Neg Hx   . Rectal cancer Neg Hx   . Stomach cancer Neg Hx       Atrial Fibrillation Management history:  Previous antiarrhythmic drugs: none Previous cardioversions: none Previous ablations: none CHADS2VASC score: 4 Anticoagulation history: Eliquis    Past Medical History:  Diagnosis Date  . AICD (automatic cardioverter/defibrillator) present    BIV  . Anginal pain ([place])   . Blood transfusion without reported diagnosis   . CAD (coronary artery disease)    with CABG LIMA to LAD, free radial PDA, SVG to diagonal, SVG to OM. 2002. DES May 2011 and 01-2010 to SVG of Diag, native OM, and Native RCA. Unable to stent LAD via LIMA10/18 PCI to dRCA, multi-site DES to SVG--> OM  . GERD (gastroesophageal reflux disease)   . Hyperlipidemia    unable to tolarate statin therapy  . Hypertension   . Kidney stone   . OSA on CPAP 12/13/2017   Severe obstructive sleep apnea with an AHI of 30.3/h and no significant central sleep apnea.  His oxygen saturations dropped to 83%.  He is on CPAP at 8 cm H2O.  . Vertigo    Past Surgical History:  Procedure Laterality Date  . BIV ICD INSERTION CRT-D N/A 07/11/2016   Procedure: BiV ICD Insertion CRT-D;  Surgeon: Evans Lance, MD;  Location: [facility];  Service: Cardiovascular;  Laterality: N/A;  . CHOLECYSTECTOMY    . COLONOSCOPY    . CORONARY ARTERY BYPASS GRAFT    . CORONARY BALLOON ANGIOPLASTY N/A 10/12/2016   Procedure: CORONARY BALLOON ANGIOPLASTY;  Surgeon: Belva Crome, MD;  Location: [facility];  Service: Cardiovascular;  Laterality: N/A;  . CORONARY STENT INTERVENTION  10/12/2016   PTCA of focal in-stent restenosis in the second obtuse marginal reducing 90% stenosis to 0%.  . CORONARY STENT INTERVENTION N/A 10/12/2016   Procedure: CORONARY STENT INTERVENTION;  Surgeon: Belva Crome, MD;  Location: [facility];  Service: Cardiovascular;  Laterality: N/A;  . LEFT HEART CATH AND CORS/GRAFTS ANGIOGRAPHY N/A 10/12/2016   Procedure: LEFT HEART CATH AND CORS/GRAFTS ANGIOGRAPHY;  Surgeon:  Belva Crome, MD;  Location: [facility];  Service: Cardiovascular;  Laterality: N/A;  . LEFT HEART CATH AND CORS/GRAFTS ANGIOGRAPHY N/A 01/08/2018   Procedure: LEFT HEART CATH AND CORS/GRAFTS ANGIOGRAPHY;  Surgeon: Belva Crome, MD;  Location: [facility];  Service: Cardiovascular;  Laterality: N/A;  . RIGHT/LEFT HEART CATH AND CORONARY/GRAFT ANGIOGRAPHY N/A 03/01/2016   Procedure: Right/Left Heart Cath and Coronary/Graft Angiography;  Surgeon: Belva Crome, MD;  Location: [facility];  Service: Cardiovascular;  Laterality: N/A;  . ULTRASOUND GUIDANCE FOR VASCULAR ACCESS  01/08/2018   Procedure: Ultrasound Guidance For Vascular Access;  Surgeon: Belva Crome, MD;  Location: [facility];  Service: Cardiovascular;;    Current Outpatient Medications  Medication Sig Dispense Refill  . Alirocumab (PRALUENT) 75 MG/ML SOPN Inject 1 pen into the skin every 14 (fourteen) days. 2 pen 11  . apixaban (ELIQUIS) 5 MG TABS tablet Take 1 tablet (5 mg total) by mouth 2 (two) times daily. 30 tablet 11  . cholecalciferol (VITAMIN D3) 25 MCG (1000 UT) tablet Take 1,000 Units by mouth daily.    Vanessa Kick Ethyl (VASCEPA) 1 g CAPS Take 2 capsules (2 g total) by mouth 2 (two) times a day. 120 capsule 5  . metoprolol succinate (TOPROL-XL) 25 MG 24 hr tablet Take 1 tablet (25 mg total) by mouth daily. Take with or immediately following a meal. 90 tablet 3  . nitroGLYCERIN (NITRODUR - DOSED IN MG/24 HR) 0.2 mg/hr patch Apply 1/4th patch to affected elbow, change daily 30 patch 1  . nitroGLYCERIN (NITROSTAT) 0.4 MG SL tablet Place 1 tablet (0.4 mg total) under the tongue every 5 (five) minutes as needed for chest pain (MAX 3 TABLETS). 25 tablet 3  . omeprazole (PRILOSEC) 40 MG capsule TAKE 1 CAPSULE BY MOUTH EVERY DAY 90 capsule 2  . sacubitril-valsartan (ENTRESTO) 49-51 MG Take 1 tablet by mouth 2 (two) times daily. 60 tablet 11  . sodium chloride (MURO 128) 2 % ophthalmic solution  Place 1 drop into the left eye 3 (three) times daily.     No current facility-administered medications for this visit.     Allergies  Allergen Reactions  . Fish Oil Other (See Comments)    FEELS BAD     Social History   Socioeconomic History  . Marital status: Married    Spouse name: Not on file  . Number of children: Not on file  . Years of education: Not on file  . Highest education level: Not on file  Occupational History  . Not on file  Social Needs  . Financial resource strain: Not on file  . Food insecurity    Worry: Not on file    Inability: Not on file  . Transportation needs    Medical: Not on file    Non-medical: Not on file  Tobacco Use  . Smoking status: Never Smoker  . Smokeless tobacco: Never Used  Substance and Sexual Activity  . Alcohol use: Yes  Alcohol/week: 1.0 standard drinks    Types: 1 Glasses of wine per week    Comment: RARE  . Drug use: No  . Sexual activity: Not on file  Lifestyle  . Physical activity    Days per week: Not on file    Minutes per session: Not on file  . Stress: Not on file  Relationships  . Social Herbalist on phone: Not on file    Gets together: Not on file    Attends religious service: Not on file    Active member of club or organization: Not on file    Attends meetings of clubs or organizations: Not on file    Relationship status: Not on file  . Intimate partner violence    Fear of current or ex partner: Not on file    Emotionally abused: Not on file    Physically abused: Not on file    Forced sexual activity: Not on file  Other Topics Concern  . Not on file  Social History Narrative  . Not on file     ROS- All systems are reviewed and negative except as per the HPI above.  Physical Exam: There were no vitals filed for this visit.  GEN- The patient is well appearing, alert and oriented x 3 today.   Head- normocephalic, atraumatic Eyes-  Sclera clear, conjunctiva pink Ears- hearing intact  Oropharynx- clear Neck- supple  Lungs- Clear to ausculation bilaterally, normal work of breathing Heart- Regular rate and rhythm, no murmurs, rubs or gallops  GI- soft, NT, ND, + BS Extremities- no clubbing, cyanosis, or edema MS- no significant deformity or atrophy Skin- no rash or lesion Psych- euthymic mood, full affect Neuro- strength and sensation are intact  Wt Readings from Last 3 Encounters:  05/14/18 189 lb (85.7 kg)  04/03/18 187 lb (84.8 kg)  02/20/18 187 lb (84.8 kg)    EKG today demonstrates AS-VP rhythm HR 77, PR 136, QRS 152, QTc 516  Echo 03/12/18 demonstrated  1. The left ventricle has mild-moderately reduced systolic function, with an ejection fraction of 40-45%. The cavity size was normal. Left ventricular diastolic Doppler parameters are consistent with impaired relaxation  2. Left ventrical global hypokinesis without regional wall motion abnormalities.  3. Mildly reduced right ventricular systolic function.  4. Left atrial size was mildly dilated.  5. When compared to the prior study: Compared to 02/22/2016, there is improved synchrony and improved overall LV systolic function.  Epic records are reviewed at length today  Assessment and Plan:  1. Paroxysmal atrial fibrillation 50 minute episode noted on device. Patient was asymptomatic.  General education about afib provided and questions answered.  We also discussed his stroke risk and the risks and benefits of anticoagulation. Continue Eliquis 5 mg BID Check CBC today. Would not consider AAD unless his episodes and symptoms become more persistent.  Lifestyle modification was discussed and encouraged including regular physical activity. Patient is very active and exercises daily.  This patients CHA2DS2-VASc Score and unadjusted Ischemic Stroke Rate (% per year) is equal to 4.8 % stroke rate/year from a score of 4  Above score calculated as 1 point each if present [CHF, HTN, DM, Vascular=MI/PAD/Aortic  Plaque, Age if 65-74, or Male] Above score calculated as 2 points each if present [Age > 75, or Stroke/TIA/TE]   2. Obstructive sleep apnea The importance of adequate treatment of sleep apnea was discussed today in order to improve our ability to maintain sinus rhythm long term.  Patient compliant with CPAP therapy. Followed by Dr Radford Pax.  3. CAD No anginal symptoms. Continue present therapy and risk factor modification.    Follow up with Dr Tamala Julian per recall. AF clinic in 6 months.   [facility] [address] [place], [place] [address] [phone] 10/10/2018 1:42 PM

## 2018-10-14 ENCOUNTER — Ambulatory Visit (INDEPENDENT_AMBULATORY_CARE_PROVIDER_SITE_OTHER): Payer: Medicare Other | Admitting: *Deleted

## 2018-10-14 DIAGNOSIS — I5022 Chronic systolic (congestive) heart failure: Secondary | ICD-10-CM | POA: Diagnosis not present

## 2018-10-14 DIAGNOSIS — I480 Paroxysmal atrial fibrillation: Secondary | ICD-10-CM

## 2018-10-15 LAB — CUP PACEART REMOTE DEVICE CHECK
Date Time Interrogation Session: 20201006214303
Implantable Lead Implant Date: 20180703
Implantable Lead Implant Date: 20180703
Implantable Lead Implant Date: 20180703
Implantable Lead Location: 753858
Implantable Lead Location: 753859
Implantable Lead Location: 753860
Implantable Lead Model: 293
Implantable Lead Model: 4674
Implantable Lead Model: 7741
Implantable Lead Serial Number: 433305
Implantable Lead Serial Number: 801469
Implantable Lead Serial Number: 901025
Implantable Pulse Generator Implant Date: 20180703
Pulse Gen Serial Number: 169965

## 2018-10-21 NOTE — Progress Notes (Signed)
Remote ICD transmission.   

## 2018-10-28 ENCOUNTER — Other Ambulatory Visit: Payer: Self-pay | Admitting: Interventional Cardiology

## 2018-10-28 DIAGNOSIS — I480 Paroxysmal atrial fibrillation: Secondary | ICD-10-CM

## 2018-10-28 MED ORDER — APIXABAN 5 MG PO TABS: 5.0000 mg | ORAL_TABLET | Freq: Two times a day (BID) | ORAL | 9 refills | Status: DC

## 2018-10-28 NOTE — Telephone Encounter (Signed)
Pt calling requesting a refill on Eliquis. Pt stated that he is almost out of his medication. Please address

## 2018-10-28 NOTE — Telephone Encounter (Signed)
Eliquis 5mg  refill request received. Pt is 68 yrs old, weight-88.5kg, Crea-1.10 on 08/29/2018, Diagnosis-Afib, and last seen by Malka So on 10/10/2018. Dose is appropriate based on dosing criteria. Will send in refill to requested pharmacy.

## 2018-11-06 ENCOUNTER — Other Ambulatory Visit: Payer: Self-pay | Admitting: Interventional Cardiology

## 2018-11-27 ENCOUNTER — Telehealth: Payer: Self-pay | Admitting: Pharmacist

## 2018-11-27 NOTE — Telephone Encounter (Addendum)
Returned call to pt and left message. Praluent approved through [facility] through 05/13/19, can reapply again at that time for continued coverage.  Advised pt I will forward his information to [employer]king to see if he qualifies for [facility] or Eliquis copay assistance while in the donut hole. He attached receipts to his email showing $133.55 copay for 1 month of Entresto and $115.42 for 1 month of Eliquis while in the donut hole.

## 2018-11-27 NOTE — Telephone Encounter (Signed)
Received the following email from patient:   Precious Haws!  The attached medications prescribed by Dr. Tamala Julian [facility]) are prohibitively expensive as you can see.  Would you let me know if I can get any financial assistance for these medications.  Also, the Praluent needs to be reenrolled to continue use.  If you can help with this or let me know how to reenroll I would greatly appreciate it.  Thanks for your help!  Rosalie Doctor  [birth year]

## 2018-11-28 NOTE — Telephone Encounter (Signed)
**Note De-Identified Adream Parzych Obfuscation** I left a detailed message on the pts VM asking him to contact both [employer] pt asst at [phone] for asst with Entresto and [employer] at [phone] for asst with Eliquis and to request that they both mail him an application.  I did leave my name and the office phone number so he can call me back if he has any questions.

## 2018-12-23 ENCOUNTER — Other Ambulatory Visit: Payer: Self-pay | Admitting: Interventional Cardiology

## 2018-12-28 ENCOUNTER — Other Ambulatory Visit: Payer: Self-pay | Admitting: Interventional Cardiology

## 2019-01-13 ENCOUNTER — Ambulatory Visit (INDEPENDENT_AMBULATORY_CARE_PROVIDER_SITE_OTHER): Payer: Medicare Other | Admitting: *Deleted

## 2019-01-13 DIAGNOSIS — I5022 Chronic systolic (congestive) heart failure: Secondary | ICD-10-CM | POA: Diagnosis not present

## 2019-01-14 LAB — CUP PACEART REMOTE DEVICE CHECK
Date Time Interrogation Session: 20210104055234
Implantable Lead Implant Date: 20180703
Implantable Lead Implant Date: 20180703
Implantable Lead Implant Date: 20180703
Implantable Lead Location: 753858
Implantable Lead Location: 753859
Implantable Lead Location: 753860
Implantable Lead Model: 293
Implantable Lead Model: 4674
Implantable Lead Model: 7741
Implantable Lead Serial Number: 433305
Implantable Lead Serial Number: 801469
Implantable Lead Serial Number: 901025
Implantable Pulse Generator Implant Date: 20180703
Pulse Gen Serial Number: 169965

## 2019-01-31 ENCOUNTER — Ambulatory Visit: Payer: Medicare Other | Attending: Internal Medicine

## 2019-01-31 DIAGNOSIS — Z23 Encounter for immunization: Secondary | ICD-10-CM | POA: Insufficient documentation

## 2019-01-31 NOTE — Progress Notes (Signed)
   Covid-19 Vaccination Clinic  Name:  Tommy [name]    MRN: [id number] DOB: 1950-01-19  01/31/2019  [name] was observed post Covid-19 immunization for 15 minutes without incidence. He was provided with Vaccine Information Sheet and instruction to access the V-Safe system.   [name] was instructed to call 911 with any severe reactions post vaccine: Marland Kitchen Difficulty breathing  . Swelling of your face and throat  . A fast heartbeat  . A bad rash all over your body  . Dizziness and weakness    Immunizations Administered    Name Date Dose VIS Date Route   Pfizer COVID-19 Vaccine 01/31/2019  3:38 PM 0.3 mL 12/20/2018 Intramuscular   Manufacturer: [place]   Lot: [id number]   [place]: [id number]

## 2019-02-10 NOTE — Progress Notes (Signed)
Cardiology Office Note:    Date:  02/11/2019   ID:  [name], DOB [birth year], MRN [id number]  PCP:  Patient, No Pcp Per  Cardiologist:  Sinclair Grooms, MD   Referring MD: No ref. provider found   Chief Complaint  Patient presents with  . Coronary Artery Disease  . Congestive Heart Failure  . Atrial Fibrillation    History of Present Illness:    Tommy [name] is a 69 y.o. male with a hx of ischemic cardiomyopathy, exertional angina pectoris, prior coronary bypass grafting 2002 with bypass graft failure and multiple native and bypass graft stents most recently October 2018, CRT-D 2018, hypertension, hyperlipidemia and chronic systolic heart failure,AICD,brief atrial fibrillationon telemetry, and OSA.  Damarian is doing well.  He denies angina.  He exercises 3 days a week.  He runs at a gym 45 minutes on each occasion..  No nitroglycerin use.  Medications are not causing side effects.  He is having a difficult time with the expense of his medications.  No orthopnea, PND, lower extremity swelling, palpitations, syncope, or change in exertional tolerance.  Past Medical History:  Diagnosis Date  . AICD (automatic cardioverter/defibrillator) present    BIV  . Anginal pain ([place])   . Blood transfusion without reported diagnosis   . CAD (coronary artery disease)    with CABG LIMA to LAD, free radial PDA, SVG to diagonal, SVG to OM. 2002. DES May 2011 and 01-2010 to SVG of Diag, native OM, and Native RCA. Unable to stent LAD via LIMA10/18 PCI to dRCA, multi-site DES to SVG--> OM  . GERD (gastroesophageal reflux disease)   . Hyperlipidemia    unable to tolarate statin therapy  . Hypertension   . Kidney stone   . OSA on CPAP 12/13/2017   Severe obstructive sleep apnea with an AHI of 30.3/h and no significant central sleep apnea.  His oxygen saturations dropped to 83%.  He is on CPAP at 8 cm H2O.  . Vertigo     Past Surgical History:  Procedure Laterality Date  . BIV ICD  INSERTION CRT-D N/A 07/11/2016   Procedure: BiV ICD Insertion CRT-D;  Surgeon: Evans Lance, MD;  Location: [facility];  Service: Cardiovascular;  Laterality: N/A;  . CHOLECYSTECTOMY    . COLONOSCOPY    . CORONARY ARTERY BYPASS GRAFT    . CORONARY BALLOON ANGIOPLASTY N/A 10/12/2016   Procedure: CORONARY BALLOON ANGIOPLASTY;  Surgeon: Belva Crome, MD;  Location: [facility];  Service: Cardiovascular;  Laterality: N/A;  . CORONARY STENT INTERVENTION  10/12/2016   PTCA of focal in-stent restenosis in the second obtuse marginal reducing 90% stenosis to 0%.  . CORONARY STENT INTERVENTION N/A 10/12/2016   Procedure: CORONARY STENT INTERVENTION;  Surgeon: Belva Crome, MD;  Location: [facility];  Service: Cardiovascular;  Laterality: N/A;  . LEFT HEART CATH AND CORS/GRAFTS ANGIOGRAPHY N/A 10/12/2016   Procedure: LEFT HEART CATH AND CORS/GRAFTS ANGIOGRAPHY;  Surgeon: Belva Crome, MD;  Location: [facility];  Service: Cardiovascular;  Laterality: N/A;  . LEFT HEART CATH AND CORS/GRAFTS ANGIOGRAPHY N/A 01/08/2018   Procedure: LEFT HEART CATH AND CORS/GRAFTS ANGIOGRAPHY;  Surgeon: Belva Crome, MD;  Location: [facility];  Service: Cardiovascular;  Laterality: N/A;  . RIGHT/LEFT HEART CATH AND CORONARY/GRAFT ANGIOGRAPHY N/A 03/01/2016   Procedure: Right/Left Heart Cath and Coronary/Graft Angiography;  Surgeon: Belva Crome, MD;  Location: [facility];  Service: Cardiovascular;  Laterality: N/A;  . ULTRASOUND GUIDANCE FOR VASCULAR ACCESS  01/08/2018   Procedure: Ultrasound Guidance For Vascular Access;  Surgeon: Belva Crome, MD;  Location: [facility];  Service: Cardiovascular;;    Current Medications: Current Meds  Medication Sig  . Alirocumab (PRALUENT) 75 MG/ML SOPN Inject 1 pen into the skin every 14 (fourteen) days.  Marland Kitchen apixaban (ELIQUIS) 5 MG TABS tablet Take 1 tablet (5 mg total) by mouth 2 (two) times daily.  . cholecalciferol  (VITAMIN D3) 25 MCG (1000 UT) tablet Take 1,000 Units by mouth daily.  Marland Kitchen ENTRESTO 49-51 MG TAKE 1 TABLET BY MOUTH TWICE A DAY  . finasteride (PROPECIA) 1 MG tablet Take 1 mg by mouth daily.  . metoprolol succinate (TOPROL-XL) 25 MG 24 hr tablet TAKE 1 TABLET BY MOUTH DAILY. TAKE WITH OR IMMEDIATELY FOLLOWING A MEAL.  . nitroGLYCERIN (NITROSTAT) 0.4 MG SL tablet Place 1 tablet (0.4 mg total) under the tongue every 5 (five) minutes as needed for chest pain (MAX 3 TABLETS).  Marland Kitchen omeprazole (PRILOSEC) 40 MG capsule TAKE 1 CAPSULE BY MOUTH EVERY DAY  . sodium chloride (MURO 128) 2 % ophthalmic solution Place 1 drop into the left eye 3 (three) times daily.  Marland Kitchen VASCEPA 1 g capsule TAKE 2 CAPSULES (2 G TOTAL) BY MOUTH 2 (TWO) TIMES A DAY.     Allergies:   Fish oil   Social History   Socioeconomic History  . Marital status: Married    Spouse name: Not on file  . Number of children: Not on file  . Years of education: Not on file  . Highest education level: Not on file  Occupational History  . Not on file  Tobacco Use  . Smoking status: Never Smoker  . Smokeless tobacco: Never Used  Substance and Sexual Activity  . Alcohol use: Yes    Alcohol/week: 1.0 standard drinks    Types: 1 Glasses of wine per week    Comment: RARE  . Drug use: No  . Sexual activity: Not on file  Other Topics Concern  . Not on file  Social History Narrative  . Not on file   Social Determinants of Health   Financial Resource Strain:   . Difficulty of Paying Living Expenses: Not on file  Food Insecurity:   . Worried About Charity fundraiser in the Last Year: Not on file  . Ran Out of Food in the Last Year: Not on file  Transportation Needs:   . Lack of Transportation (Medical): Not on file  . Lack of Transportation (Non-Medical): Not on file  Physical Activity:   . Days of Exercise per Week: Not on file  . Minutes of Exercise per Session: Not on file  Stress:   . Feeling of Stress : Not on file  Social  Connections:   . Frequency of Communication with Friends and Family: Not on file  . Frequency of Social Gatherings with Friends and Family: Not on file  . Attends Religious Services: Not on file  . Active Member of Clubs or Organizations: Not on file  . Attends Archivist Meetings: Not on file  . Marital Status: Not on file     Family History: The patient's family history includes Colon cancer in his father. There is no history of Sudden death, Hypertension, Hyperlipidemia, Heart attack, Diabetes, Rectal cancer, or Stomach cancer.  ROS:   Please see the history of present illness.    No complaints other than still having  skin eruption on his face.  All other systems reviewed and are negative.  EKGs/Labs/Other Studies Reviewed:    The following studies were reviewed today: No recent or new imaging data  EKG:  EKG is not performed on today's visit  Recent Labs: 08/29/2018: ALT 31; BUN 17; Creatinine, Ser 1.10; Potassium 4.4; Sodium 138 10/10/2018: Hemoglobin 14.6; Platelets 326  Recent Lipid Panel    Component Value Date/Time   CHOL 118 08/29/2018 0848   TRIG 136 08/29/2018 0848   HDL 44 08/29/2018 0848   CHOLHDL 2.7 08/29/2018 0848   CHOLHDL 3.3 03/03/2015 0744   VLDL 22 03/03/2015 0744   LDLCALC 47 08/29/2018 0848   LDLDIRECT 81 10/24/2016 1119   LDLDIRECT 91.0 02/05/2014 0809    Physical Exam:    VS:  BP (!) 142/78   Pulse 68   Ht 6\' 1"  (1.854 m)   Wt 197 lb 12.8 oz (89.7 kg)   SpO2 99%   BMI 26.10 kg/m     Wt Readings from Last 3 Encounters:  02/11/19 197 lb 12.8 oz (89.7 kg)  10/10/18 195 lb (88.5 kg)  05/14/18 189 lb (85.7 kg)     GEN: Healthy-appearing. No acute distress HEENT: Normal NECK: No JVD. LYMPHATICS: No lymphadenopathy CARDIAC:  RRR without murmur, gallop, or edema. VASCULAR:  Normal Pulses. No bruits. RESPIRATORY:  Clear to auscultation without rales, wheezing or rhonchi  ABDOMEN: Soft, non-tender, non-distended, No pulsatile  mass, MUSCULOSKELETAL: No deformity  SKIN: Warm and dry NEUROLOGIC:  Alert and oriented x 3 PSYCHIATRIC:  Normal affect   ASSESSMENT:    1. Chronic systolic heart failure ([place])   2. Coronary artery disease involving coronary bypass graft of native heart with angina pectoris (HCC)   3. Paroxysmal atrial fibrillation ([place])   4. HYPERTENSION, BENIGN SYSTEMIC   5. Biventricular automatic implantable cardioverter defibrillator in situ   6. Mixed hyperlipidemia   7. LBBB (left bundle branch block)   8. Severe obstructive sleep apnea   9. Educated about COVID-19 virus infection    PLAN:    In order of problems listed above:  1. No symptoms of heart failure.  He is on guideline directed therapy that includes Entresto and Toprol-XL.  If his blood pressure continues in this range, we will consider adding low-dose spironolactone or eplerenone. 2. Stable without anginal complaints.  He is on strong antilipid therapy with Colletta [place] Mab and Vascepa. 3. No episodes of symptomatic tachycardia.  He continues on Eliquis for stroke prevention. 4. Continue to monitor the blood pressure.  It is usually higher in the office.  When I try to treat blood pressures in the current range he has symptomatic hypotension.  He monitors at home and states that the systolic usually runs between 115 and 125 mmHg. 5. Not reassessed. 6. See dialogue under coronary disease. 7. Resynchronization therapy has been performed. 8. He is compliant with CPAP. 9. 3W's and COVID-19 vaccine are endorsed.  His second dose will occur next week.  Overall education and awareness concerning primary/secondary risk prevention was discussed in detail: LDL less than 70, hemoglobin A1c less than 7, blood pressure target less than 130/80 mmHg, >150 minutes of moderate aerobic activity per week, avoidance of smoking, weight control (via diet and exercise), and continued surveillance/management of/for obstructive sleep apnea.    Medication  Adjustments/Labs and Tests Ordered: Current medicines are reviewed at length with the patient today.  Concerns regarding medicines are outlined above.  Orders Placed This Encounter  Procedures  .  Lipid panel  . Hepatic function panel   No orders of the defined types were placed in this encounter.   Patient Instructions  Medication Instructions:  Your physician recommends that you continue on your current medications as directed. Please refer to the Current Medication list given to you today.  *If you need a refill on your cardiac medications before your next appointment, please call your pharmacy*  Lab Work: Lipid and Liver today  If you have labs (blood work) drawn today and your tests are completely normal, you will receive your results only by: Marland Kitchen MyChart Message (if you have MyChart) OR . A paper copy in the mail If you have any lab test that is abnormal or we need to change your treatment, we will call you to review the results.  Testing/Procedures: None  Follow-Up: At [facility], you and your health needs are our priority.  As part of our continuing mission to provide you with exceptional heart care, we have created designated Provider Care Teams.  These Care Teams include your primary Cardiologist (physician) and Advanced Practice Providers (APPs -  Physician Assistants and Nurse Practitioners) who all work together to provide you with the care you need, when you need it.  Your next appointment:   6 month(s)  The format for your next appointment:   In Person  Provider:   You may see Sinclair Grooms, MD or one of the following Advanced Practice Providers on your designated Care Team:    Truitt Merle, NP  Cecilie Kicks, NP  Kathyrn Drown, NP   Other Instructions      Signed, Sinclair Grooms, MD  02/11/2019 8:32 AM    [place]

## 2019-02-11 ENCOUNTER — Encounter: Payer: Self-pay | Admitting: Interventional Cardiology

## 2019-02-11 ENCOUNTER — Ambulatory Visit (INDEPENDENT_AMBULATORY_CARE_PROVIDER_SITE_OTHER): Payer: Medicare Other | Admitting: Interventional Cardiology

## 2019-02-11 ENCOUNTER — Other Ambulatory Visit: Payer: Self-pay

## 2019-02-11 VITALS — BP 142/78 | HR 68 | Ht 73.0 in | Wt 197.8 lb

## 2019-02-11 DIAGNOSIS — I447 Left bundle-branch block, unspecified: Secondary | ICD-10-CM | POA: Diagnosis not present

## 2019-02-11 DIAGNOSIS — Z95810 Presence of automatic (implantable) cardiac defibrillator: Secondary | ICD-10-CM

## 2019-02-11 DIAGNOSIS — I480 Paroxysmal atrial fibrillation: Secondary | ICD-10-CM

## 2019-02-11 DIAGNOSIS — I25709 Atherosclerosis of coronary artery bypass graft(s), unspecified, with unspecified angina pectoris: Secondary | ICD-10-CM | POA: Diagnosis not present

## 2019-02-11 DIAGNOSIS — E782 Mixed hyperlipidemia: Secondary | ICD-10-CM

## 2019-02-11 DIAGNOSIS — I5022 Chronic systolic (congestive) heart failure: Secondary | ICD-10-CM | POA: Diagnosis not present

## 2019-02-11 DIAGNOSIS — Z7189 Other specified counseling: Secondary | ICD-10-CM | POA: Diagnosis not present

## 2019-02-11 DIAGNOSIS — I10 Essential (primary) hypertension: Secondary | ICD-10-CM

## 2019-02-11 DIAGNOSIS — G4733 Obstructive sleep apnea (adult) (pediatric): Secondary | ICD-10-CM

## 2019-02-11 LAB — LIPID PANEL
Chol/HDL Ratio: 2.3 ratio (ref 0.0–5.0)
Cholesterol, Total: 110 mg/dL (ref 100–199)
HDL: 48 mg/dL (ref >39)
LDL Chol Calc (NIH): 39 mg/dL (ref 0–99)
Triglycerides: 132 mg/dL (ref 0–149)
VLDL Cholesterol Cal: 23 mg/dL (ref 5–40)

## 2019-02-11 LAB — HEPATIC FUNCTION PANEL
ALT: 20 IU/L (ref 0–44)
AST: 21 IU/L (ref 0–40)
Albumin: 4.5 g/dL (ref 3.8–4.8)
Alkaline Phosphatase: 91 IU/L (ref 39–117)
Bilirubin Total: 0.6 mg/dL (ref 0.0–1.2)
Bilirubin, Direct: 0.19 mg/dL (ref 0.00–0.40)
Total Protein: 7 g/dL (ref 6.0–8.5)

## 2019-02-11 NOTE — Patient Instructions (Signed)
Medication Instructions:  Your physician recommends that you continue on your current medications as directed. Please refer to the Current Medication list given to you today.  *If you need a refill on your cardiac medications before your next appointment, please call your pharmacy*  Lab Work: Lipid and Liver today  If you have labs (blood work) drawn today and your tests are completely normal, you will receive your results only by: Marland Kitchen MyChart Message (if you have MyChart) OR . A paper copy in the mail If you have any lab test that is abnormal or we need to change your treatment, we will call you to review the results.  Testing/Procedures: None  Follow-Up: At [facility], you and your health needs are our priority.  As part of our continuing mission to provide you with exceptional heart care, we have created designated Provider Care Teams.  These Care Teams include your primary Cardiologist (physician) and Advanced Practice Providers (APPs -  Physician Assistants and Nurse Practitioners) who all work together to provide you with the care you need, when you need it.  Your next appointment:   6 month(s)  The format for your next appointment:   In Person  Provider:   You may see Sinclair Grooms, MD or one of the following Advanced Practice Providers on your designated Care Team:    Truitt Merle, NP  Cecilie Kicks, NP  Kathyrn Drown, NP   Other Instructions

## 2019-02-13 DIAGNOSIS — H6982 Other specified disorders of Eustachian tube, left ear: Secondary | ICD-10-CM | POA: Diagnosis not present

## 2019-02-13 DIAGNOSIS — Z0000 Encounter for general adult medical examination without abnormal findings: Secondary | ICD-10-CM | POA: Diagnosis not present

## 2019-02-21 ENCOUNTER — Ambulatory Visit: Payer: Medicare Other | Attending: Internal Medicine

## 2019-02-21 ENCOUNTER — Other Ambulatory Visit: Payer: Self-pay | Admitting: Pharmacist

## 2019-02-21 DIAGNOSIS — Z23 Encounter for immunization: Secondary | ICD-10-CM | POA: Insufficient documentation

## 2019-02-21 MED ORDER — PRALUENT 75 MG/ML ~~LOC~~ SOAJ: 1.0000 "pen " | SUBCUTANEOUS | 3 refills | Status: DC

## 2019-02-21 NOTE — Progress Notes (Signed)
   Covid-19 Vaccination Clinic  Name:  Tommy [name]    MRN: [id number] DOB: April 24, 1950  02/21/2019  [name] was observed post Covid-19 immunization for 15 minutes without incidence. He was provided with Vaccine Information Sheet and instruction to access the V-Safe system.   [name] was instructed to call 911 with any severe reactions post vaccine: Marland Kitchen Difficulty breathing  . Swelling of your face and throat  . A fast heartbeat  . A bad rash all over your body  . Dizziness and weakness    Immunizations Administered    Name Date Dose VIS Date Route   Pfizer COVID-19 Vaccine 02/21/2019 10:43 AM 0.3 mL 12/20/2018 Intramuscular   Manufacturer: [place]   Lot: [id number]   [place]: [id number]

## 2019-03-04 DIAGNOSIS — H9312 Tinnitus, left ear: Secondary | ICD-10-CM | POA: Diagnosis not present

## 2019-03-04 DIAGNOSIS — G4733 Obstructive sleep apnea (adult) (pediatric): Secondary | ICD-10-CM | POA: Diagnosis not present

## 2019-03-04 DIAGNOSIS — Z9989 Dependence on other enabling machines and devices: Secondary | ICD-10-CM | POA: Diagnosis not present

## 2019-03-04 DIAGNOSIS — H90A22 Sensorineural hearing loss, unilateral, left ear, with restricted hearing on the contralateral side: Secondary | ICD-10-CM | POA: Diagnosis not present

## 2019-03-27 ENCOUNTER — Telehealth: Payer: Self-pay | Admitting: Pharmacist

## 2019-03-27 NOTE — Telephone Encounter (Signed)
New insurance [employer] Member ID [id number] Approved through 03/26/2020 MyChart message sent to patient to make him aware.

## 2019-04-15 ENCOUNTER — Telehealth: Payer: Self-pay | Admitting: Internal Medicine

## 2019-04-15 NOTE — Telephone Encounter (Signed)
  1. Has your device fired? no  2. Is you device beeping? no  3. Are you experiencing draining or swelling at device site? no  4. Are you calling to see if we received your device transmission? no  5. Have you passed out? no  Patient states he got a message to send in a transmission, but he is out of town.   Please route to [place]

## 2019-04-15 NOTE — Telephone Encounter (Signed)
The pt states he will be out of town until the middle of next week. I schedule his next remote for 04/24/2019. The pt agreed and thanked me for the call.

## 2019-04-24 ENCOUNTER — Ambulatory Visit (INDEPENDENT_AMBULATORY_CARE_PROVIDER_SITE_OTHER): Payer: Medicare Other | Admitting: *Deleted

## 2019-04-24 DIAGNOSIS — I5022 Chronic systolic (congestive) heart failure: Secondary | ICD-10-CM

## 2019-04-24 LAB — CUP PACEART REMOTE DEVICE CHECK
Date Time Interrogation Session: 20210414160145
Implantable Lead Implant Date: 20180703
Implantable Lead Implant Date: 20180703
Implantable Lead Implant Date: 20180703
Implantable Lead Location: 753858
Implantable Lead Location: 753859
Implantable Lead Location: 753860
Implantable Lead Model: 293
Implantable Lead Model: 4674
Implantable Lead Model: 7741
Implantable Lead Serial Number: 433305
Implantable Lead Serial Number: 801469
Implantable Lead Serial Number: 901025
Implantable Pulse Generator Implant Date: 20180703
Pulse Gen Serial Number: 169965

## 2019-04-24 NOTE — Progress Notes (Signed)
ICD Remote  

## 2019-04-30 ENCOUNTER — Ambulatory Visit: Payer: Medicare Other | Admitting: Sports Medicine

## 2019-05-05 ENCOUNTER — Other Ambulatory Visit: Payer: Self-pay | Admitting: Interventional Cardiology

## 2019-05-09 DIAGNOSIS — R42 Dizziness and giddiness: Secondary | ICD-10-CM | POA: Diagnosis not present

## 2019-05-09 DIAGNOSIS — R110 Nausea: Secondary | ICD-10-CM | POA: Diagnosis not present

## 2019-06-15 ENCOUNTER — Other Ambulatory Visit: Payer: Self-pay | Admitting: Interventional Cardiology

## 2019-07-24 ENCOUNTER — Ambulatory Visit (INDEPENDENT_AMBULATORY_CARE_PROVIDER_SITE_OTHER): Payer: Medicare Other | Admitting: *Deleted

## 2019-07-24 DIAGNOSIS — I480 Paroxysmal atrial fibrillation: Secondary | ICD-10-CM

## 2019-07-24 LAB — CUP PACEART REMOTE DEVICE CHECK
Battery Remaining Longevity: 126 mo
Battery Remaining Percentage: 100 %
Brady Statistic RA Percent Paced: 2 %
Brady Statistic RV Percent Paced: 10 %
Date Time Interrogation Session: 20210715044100
HighPow Impedance: 74 Ohm
Implantable Lead Implant Date: 20180703
Implantable Lead Implant Date: 20180703
Implantable Lead Implant Date: 20180703
Implantable Lead Location: 753858
Implantable Lead Location: 753859
Implantable Lead Location: 753860
Implantable Lead Model: 293
Implantable Lead Model: 4674
Implantable Lead Model: 7741
Implantable Lead Serial Number: 433305
Implantable Lead Serial Number: 801469
Implantable Lead Serial Number: 901025
Implantable Pulse Generator Implant Date: 20180703
Lead Channel Impedance Value: 503 Ohm
Lead Channel Impedance Value: 589 Ohm
Lead Channel Impedance Value: 620 Ohm
Lead Channel Setting Pacing Amplitude: 1.5 V
Lead Channel Setting Pacing Amplitude: 2 V
Lead Channel Setting Pacing Amplitude: 2 V
Lead Channel Setting Pacing Pulse Width: 0.4 ms
Lead Channel Setting Pacing Pulse Width: 0.4 ms
Lead Channel Setting Sensing Sensitivity: 0.6 mV
Lead Channel Setting Sensing Sensitivity: 1 mV
Pulse Gen Serial Number: 169965

## 2019-07-25 NOTE — Progress Notes (Signed)
Remote ICD transmission.   

## 2019-07-26 ENCOUNTER — Other Ambulatory Visit: Payer: Self-pay | Admitting: Interventional Cardiology

## 2019-07-28 ENCOUNTER — Other Ambulatory Visit: Payer: Self-pay | Admitting: Interventional Cardiology

## 2019-08-08 ENCOUNTER — Other Ambulatory Visit: Payer: Self-pay | Admitting: Interventional Cardiology

## 2019-08-20 ENCOUNTER — Other Ambulatory Visit: Payer: Self-pay | Admitting: Interventional Cardiology

## 2019-08-22 ENCOUNTER — Telehealth: Payer: Self-pay | Admitting: Interventional Cardiology

## 2019-08-22 DIAGNOSIS — E782 Mixed hyperlipidemia: Secondary | ICD-10-CM

## 2019-08-22 NOTE — Telephone Encounter (Signed)
Patient wanted to know if Dr. Tamala Julian wanted him to have a lipid panel done before his appt 08/27/19

## 2019-08-23 NOTE — Telephone Encounter (Signed)
Yes please perform a fasting liver and lipid panel.

## 2019-08-25 NOTE — Addendum Note (Signed)
Addended by: Thompson Grayer on: 08/25/2019 02:44 PM   Modules accepted: Orders

## 2019-08-25 NOTE — Telephone Encounter (Signed)
I spoke with patient. He will come in tomorrow for fasting lab work

## 2019-08-26 ENCOUNTER — Other Ambulatory Visit: Payer: Self-pay

## 2019-08-26 ENCOUNTER — Other Ambulatory Visit: Payer: Medicare Other

## 2019-08-26 DIAGNOSIS — E782 Mixed hyperlipidemia: Secondary | ICD-10-CM | POA: Diagnosis not present

## 2019-08-26 LAB — HEPATIC FUNCTION PANEL
ALT: 14 IU/L (ref 0–44)
AST: 15 IU/L (ref 0–40)
Albumin: 4.6 g/dL (ref 3.8–4.8)
Alkaline Phosphatase: 80 IU/L (ref 48–121)
Bilirubin Total: 0.4 mg/dL (ref 0.0–1.2)
Bilirubin, Direct: 0.15 mg/dL (ref 0.00–0.40)
Total Protein: 6.6 g/dL (ref 6.0–8.5)

## 2019-08-26 LAB — LIPID PANEL
Chol/HDL Ratio: 2.4 ratio (ref 0.0–5.0)
Cholesterol, Total: 99 mg/dL — ABNORMAL LOW (ref 100–199)
HDL: 41 mg/dL (ref >39)
LDL Chol Calc (NIH): 35 mg/dL (ref 0–99)
Triglycerides: 130 mg/dL (ref 0–149)
VLDL Cholesterol Cal: 23 mg/dL (ref 5–40)

## 2019-08-26 NOTE — Progress Notes (Signed)
Cardiology Office Note:    Date:  08/27/2019   ID:  [name], DOB 05/05/1950, MRN [id number]  PCP:  Patient, No Pcp Per  Cardiologist:  Sinclair Grooms, MD   Referring MD: No ref. provider found   Chief Complaint  Patient presents with  . Coronary Artery Disease  . Congestive Heart Failure  . Hyperlipidemia    History of Present Illness:    Tommy [name] is a 69 y.o. male with a hx of  ischemic cardiomyopathy, exertional angina pectoris, prior coronary bypass grafting 2002 with bypass graft failure and multiple native and bypass graft stents most recently October 2018, CRT-D 2018, hypertension, hyperlipidemia and chronic systolic heart failure,AICD,brief atrial fibrillationon telemetry, and OSA.  Seems to be doing well.  Still exercising multiple times per week which also includes playing tennis.  He runs and also uses devices to very exercise type.  He is getting both isometric and isotonic activity.  Not had angina, orthopnea, PND, palpitations, syncope, lower extremity swelling, or other complaints.  Past Medical History:  Diagnosis Date  . AICD (automatic cardioverter/defibrillator) present    BIV  . Anginal pain ([place])   . Blood transfusion without reported diagnosis   . CAD (coronary artery disease)    with CABG LIMA to LAD, free radial PDA, SVG to diagonal, SVG to OM. 2002. DES May 2011 and 01-2010 to SVG of Diag, native OM, and Native RCA. Unable to stent LAD via LIMA10/18 PCI to dRCA, multi-site DES to SVG--> OM  . GERD (gastroesophageal reflux disease)   . Hyperlipidemia    unable to tolarate statin therapy  . Hypertension   . Kidney stone   . OSA on CPAP 12/13/2017   Severe obstructive sleep apnea with an AHI of 30.3/h and no significant central sleep apnea.  His oxygen saturations dropped to 83%.  He is on CPAP at 8 cm H2O.  . Vertigo     Past Surgical History:  Procedure Laterality Date  . BIV ICD INSERTION CRT-D N/A 07/11/2016   Procedure: BiV ICD  Insertion CRT-D;  Surgeon: Evans Lance, MD;  Location: [facility];  Service: Cardiovascular;  Laterality: N/A;  . CHOLECYSTECTOMY    . COLONOSCOPY    . CORONARY ARTERY BYPASS GRAFT    . CORONARY BALLOON ANGIOPLASTY N/A 10/12/2016   Procedure: CORONARY BALLOON ANGIOPLASTY;  Surgeon: Belva Crome, MD;  Location: [facility];  Service: Cardiovascular;  Laterality: N/A;  . CORONARY STENT INTERVENTION  10/12/2016   PTCA of focal in-stent restenosis in the second obtuse marginal reducing 90% stenosis to 0%.  . CORONARY STENT INTERVENTION N/A 10/12/2016   Procedure: CORONARY STENT INTERVENTION;  Surgeon: Belva Crome, MD;  Location: [facility];  Service: Cardiovascular;  Laterality: N/A;  . LEFT HEART CATH AND CORS/GRAFTS ANGIOGRAPHY N/A 10/12/2016   Procedure: LEFT HEART CATH AND CORS/GRAFTS ANGIOGRAPHY;  Surgeon: Belva Crome, MD;  Location: [facility];  Service: Cardiovascular;  Laterality: N/A;  . LEFT HEART CATH AND CORS/GRAFTS ANGIOGRAPHY N/A 01/08/2018   Procedure: LEFT HEART CATH AND CORS/GRAFTS ANGIOGRAPHY;  Surgeon: Belva Crome, MD;  Location: [facility];  Service: Cardiovascular;  Laterality: N/A;  . RIGHT/LEFT HEART CATH AND CORONARY/GRAFT ANGIOGRAPHY N/A 03/01/2016   Procedure: Right/Left Heart Cath and Coronary/Graft Angiography;  Surgeon: Belva Crome, MD;  Location: [facility];  Service: Cardiovascular;  Laterality: N/A;  . ULTRASOUND GUIDANCE FOR VASCULAR ACCESS  01/08/2018  Procedure: Ultrasound Guidance For Vascular Access;  Surgeon: Belva Crome, MD;  Location: [facility];  Service: Cardiovascular;;    Current Medications: Current Meds  Medication Sig  . Alirocumab (PRALUENT) 75 MG/ML SOAJ Inject 1 pen into the skin every 14 (fourteen) days.  Marland Kitchen apixaban (ELIQUIS) 5 MG TABS tablet Take 1 tablet (5 mg total) by mouth 2 (two) times daily.  . cholecalciferol (VITAMIN D3) 25 MCG (1000 UT) tablet Take 1,000 Units by  mouth daily.  Marland Kitchen ENTRESTO 49-51 MG TAKE 1 TABLET BY MOUTH TWICE A DAY  . finasteride (PROPECIA) 1 MG tablet Take 1 mg by mouth daily.  Marland Kitchen icosapent Ethyl (VASCEPA) 1 g capsule Take 2 capsules (2 g total) by mouth 2 (two) times daily. Pt needs to make appt with provider for more refills - 1st attempt  . metoprolol succinate (TOPROL-XL) 25 MG 24 hr tablet TAKE 1 TABLET BY MOUTH DAILY. TAKE WITH OR IMMEDIATELY FOLLOWING A MEAL.  . nitroGLYCERIN (NITROSTAT) 0.4 MG SL tablet Place 1 tablet (0.4 mg total) under the tongue every 5 (five) minutes as needed for chest pain (MAX 3 TABLETS).  Marland Kitchen omeprazole (PRILOSEC) 40 MG capsule TAKE 1 CAPSULE BY MOUTH EVERY DAY  . sodium chloride (MURO 128) 2 % ophthalmic solution Place 1 drop into the left eye 3 (three) times daily.     Allergies:   Fish oil   Social History   Socioeconomic History  . Marital status: Married    Spouse name: Not on file  . Number of children: Not on file  . Years of education: Not on file  . Highest education level: Not on file  Occupational History  . Not on file  Tobacco Use  . Smoking status: Never Smoker  . Smokeless tobacco: Never Used  Vaping Use  . Vaping Use: Never used  Substance and Sexual Activity  . Alcohol use: Yes    Alcohol/week: 1.0 standard drink    Types: 1 Glasses of wine per week    Comment: RARE  . Drug use: No  . Sexual activity: Not on file  Other Topics Concern  . Not on file  Social History Narrative  . Not on file   Social Determinants of Health   Financial Resource Strain:   . Difficulty of Paying Living Expenses:   Food Insecurity:   . Worried About Charity fundraiser in the Last Year:   . Arboriculturist in the Last Year:   Transportation Needs:   . Film/video editor (Medical):   Marland Kitchen Lack of Transportation (Non-Medical):   Physical Activity:   . Days of Exercise per Week:   . Minutes of Exercise per Session:   Stress:   . Feeling of Stress :   Social Connections:   .  Frequency of Communication with Friends and Family:   . Frequency of Social Gatherings with Friends and Family:   . Attends Religious Services:   . Active Member of Clubs or Organizations:   . Attends Archivist Meetings:   Marland Kitchen Marital Status:      Family History: The patient's family history includes Colon cancer in his father. There is no history of Sudden death, Hypertension, Hyperlipidemia, Heart attack, Diabetes, Rectal cancer, or Stomach cancer.  ROS:   Please see the history of present illness.    He still has flushing.  He has tinnitus.  He has been to an ENT and no specific recommendations have been made.  Vertigo  has also been an issue.  All other systems reviewed and are negative.  EKGs/Labs/Other Studies Reviewed:    The following studies were reviewed today: No new imaging data  EKG:  EKG sinus rhythm with atrial sensing and ventricular pacing.  Recent Labs: 08/29/2018: BUN 17; Creatinine, Ser 1.10; Potassium 4.4; Sodium 138 10/10/2018: Hemoglobin 14.6; Platelets 326 08/26/2019: ALT 14  Recent Lipid Panel    Component Value Date/Time   CHOL 99 (L) 08/26/2019 0818   TRIG 130 08/26/2019 0818   HDL 41 08/26/2019 0818   CHOLHDL 2.4 08/26/2019 0818   CHOLHDL 3.3 03/03/2015 0744   VLDL 22 03/03/2015 0744   LDLCALC 35 08/26/2019 0818   LDLDIRECT 81 10/24/2016 1119   LDLDIRECT 91.0 02/05/2014 0809    Physical Exam:    VS:  BP 106/78   Pulse 76   Ht 6' (1.829 m)   Wt 192 lb 12.8 oz (87.5 kg)   SpO2 98%   BMI 26.15 kg/m     Wt Readings from Last 3 Encounters:  08/27/19 192 lb 12.8 oz (87.5 kg)  02/11/19 197 lb 12.8 oz (89.7 kg)  10/10/18 195 lb (88.5 kg)     GEN: Healthy-appearing. No acute distress HEENT: Normal NECK: No JVD. LYMPHATICS: No lymphadenopathy CARDIAC:  RRR without murmur, gallop, or edema. VASCULAR:  Normal Pulses. No bruits. RESPIRATORY:  Clear to auscultation without rales, wheezing or rhonchi  ABDOMEN: Soft, non-tender,  non-distended, No pulsatile mass, MUSCULOSKELETAL: No deformity  SKIN: Warm and dry NEUROLOGIC:  Alert and oriented x 3 PSYCHIATRIC:  Normal affect   ASSESSMENT:    1. Coronary artery disease involving coronary bypass graft of native heart with angina pectoris ([place])   2. Chronic systolic heart failure ([place])   3. Mixed hyperlipidemia   4. Paroxysmal atrial fibrillation ([facility])   5. Severe obstructive sleep apnea   6. LBBB (left bundle branch block)   7. Biventricular automatic implantable cardioverter defibrillator in situ   8. HYPERTENSION, BENIGN SYSTEMIC   9. Educated about COVID-19 virus infection    PLAN:    In order of problems listed above:  1. We reviewed secondary prevention 2. No clinical evidence of volume overload.  Heart failure regimen includes Entresto, Toprol-XL. 3. Continue Praluent and Vascepa.  Lipids are excellent with LDL less than 50.  Triglyceride is 130. 4. No clinical episodes of A. fib.  He will continue Eliquis 5 mg twice daily and monitor for bleeding. 5. CPAP is being used compliantly. 6. Not discussed.  Has had resynchronization. 7. No treatment from his device. 8. Excellent blood pressure on current medical regimen which includes Entresto, and Toprol-XL. 9. He has been vaccinated.  Mitigation measures are being practiced.  He is open to boost.  Guideline directed therapy for left ventricular systolic dysfunction: Angiotensin receptor-neprilysin inhibitor (ARNI)-Entresto; beta-blocker therapy - carvedilol, metoprolol succinate, or bisoprolol; mineralocorticoid receptor antagonist (MRA) therapy -spironolactone or eplerenone.  SGLT-2 agents -  Dapagliflozin Wilder [place]) or Empagliflozin (Jardiance).These therapies have been shown to improve clinical outcomes including reduction of rehospitalization, survival, and acute heart failure.  We may consider adding an SGLT2.  Currently he feels overloaded by medications.  I can appreciate his concern.   Medication  Adjustments/Labs and Tests Ordered: Current medicines are reviewed at length with the patient today.  Concerns regarding medicines are outlined above.  Orders Placed This Encounter  Procedures  . Lipid panel  . Hepatic function panel  . EKG 12-Lead   No orders of the defined types were placed  in this encounter.   Patient Instructions  Medication Instructions:  Your physician recommends that you continue on your current medications as directed. Please refer to the Current Medication list given to you today.  *If you need a refill on your cardiac medications before your next appointment, please call your pharmacy*   Lab Work: Lipid and Liver prior to next visit.  You will need to be fasting (nothing to eat or drink after midnight except water and black coffee).   If you have labs (blood work) drawn today and your tests are completely normal, you will receive your results only by: Marland Kitchen MyChart Message (if you have MyChart) OR . A paper copy in the mail If you have any lab test that is abnormal or we need to change your treatment, we will call you to review the results.   Testing/Procedures: None   Follow-Up: At [facility], you and your health needs are our priority.  As part of our continuing mission to provide you with exceptional heart care, we have created designated Provider Care Teams.  These Care Teams include your primary Cardiologist (physician) and Advanced Practice Providers (APPs -  Physician Assistants and Nurse Practitioners) who all work together to provide you with the care you need, when you need it.  We recommend signing up for the patient portal called "MyChart".  Sign up information is provided on this After Visit Summary.  MyChart is used to connect with patients for Virtual Visits (Telemedicine).  Patients are able to view lab/test results, encounter notes, upcoming appointments, etc.  Non-urgent messages can be sent to your provider as well.   To learn more  about what you can do with MyChart, go to [email or web address].    Your next appointment:   6 month(s)  The format for your next appointment:   In Person  Provider:   You may see Sinclair Grooms, MD or one of the following Advanced Practice Providers on your designated Care Team:    Truitt Merle, NP  Cecilie Kicks, NP  Kathyrn Drown, NP    Other Instructions      Signed, Sinclair Grooms, MD  08/27/2019 10:50 AM    [place]

## 2019-08-27 ENCOUNTER — Ambulatory Visit (INDEPENDENT_AMBULATORY_CARE_PROVIDER_SITE_OTHER): Payer: Medicare Other | Admitting: Interventional Cardiology

## 2019-08-27 ENCOUNTER — Encounter: Payer: Self-pay | Admitting: Interventional Cardiology

## 2019-08-27 VITALS — BP 106/78 | HR 76 | Ht 72.0 in | Wt 192.8 lb

## 2019-08-27 DIAGNOSIS — I25709 Atherosclerosis of coronary artery bypass graft(s), unspecified, with unspecified angina pectoris: Secondary | ICD-10-CM

## 2019-08-27 DIAGNOSIS — I5022 Chronic systolic (congestive) heart failure: Secondary | ICD-10-CM

## 2019-08-27 DIAGNOSIS — G4733 Obstructive sleep apnea (adult) (pediatric): Secondary | ICD-10-CM

## 2019-08-27 DIAGNOSIS — I447 Left bundle-branch block, unspecified: Secondary | ICD-10-CM

## 2019-08-27 DIAGNOSIS — Z95810 Presence of automatic (implantable) cardiac defibrillator: Secondary | ICD-10-CM | POA: Diagnosis not present

## 2019-08-27 DIAGNOSIS — I10 Essential (primary) hypertension: Secondary | ICD-10-CM | POA: Diagnosis not present

## 2019-08-27 DIAGNOSIS — I480 Paroxysmal atrial fibrillation: Secondary | ICD-10-CM | POA: Diagnosis not present

## 2019-08-27 DIAGNOSIS — Z7189 Other specified counseling: Secondary | ICD-10-CM | POA: Diagnosis not present

## 2019-08-27 DIAGNOSIS — E782 Mixed hyperlipidemia: Secondary | ICD-10-CM

## 2019-08-27 NOTE — Patient Instructions (Signed)
Medication Instructions:  Your physician recommends that you continue on your current medications as directed. Please refer to the Current Medication list given to you today.  *If you need a refill on your cardiac medications before your next appointment, please call your pharmacy*   Lab Work: Lipid and Liver prior to next visit.  You will need to be fasting (nothing to eat or drink after midnight except water and black coffee).   If you have labs (blood work) drawn today and your tests are completely normal, you will receive your results only by: Marland Kitchen MyChart Message (if you have MyChart) OR . A paper copy in the mail If you have any lab test that is abnormal or we need to change your treatment, we will call you to review the results.   Testing/Procedures: None   Follow-Up: At [facility], you and your health needs are our priority.  As part of our continuing mission to provide you with exceptional heart care, we have created designated Provider Care Teams.  These Care Teams include your primary Cardiologist (physician) and Advanced Practice Providers (APPs -  Physician Assistants and Nurse Practitioners) who all work together to provide you with the care you need, when you need it.  We recommend signing up for the patient portal called "MyChart".  Sign up information is provided on this After Visit Summary.  MyChart is used to connect with patients for Virtual Visits (Telemedicine).  Patients are able to view lab/test results, encounter notes, upcoming appointments, etc.  Non-urgent messages can be sent to your provider as well.   To learn more about what you can do with MyChart, go to [email or web address].    Your next appointment:   6 month(s)  The format for your next appointment:   In Person  Provider:   You may see Sinclair Grooms, MD or one of the following Advanced Practice Providers on your designated Care Team:    Truitt Merle, NP  Cecilie Kicks, NP  Kathyrn Drown, NP    Other Instructions

## 2019-10-14 ENCOUNTER — Other Ambulatory Visit: Payer: Self-pay | Admitting: Internal Medicine

## 2019-10-14 DIAGNOSIS — I480 Paroxysmal atrial fibrillation: Secondary | ICD-10-CM

## 2019-10-14 NOTE — Telephone Encounter (Signed)
Prescription refill request for Eliquis received.  Last office visit: Tommy [name] 08/27/2019 Scr:  1.10, 08/29/2018 Age: 69  Weight: 87.5 kg   Pt overdue for blood work.

## 2019-10-14 NOTE — Telephone Encounter (Signed)
Called and spoke to pt. Scheduled him for an appointment to come in on 10/7 to have blood work. Pt stated that he has a week supply of Eliquis, so he will have enough to get him to his appointment.

## 2019-10-16 ENCOUNTER — Other Ambulatory Visit: Payer: Self-pay

## 2019-10-16 ENCOUNTER — Other Ambulatory Visit: Payer: Medicare Other

## 2019-10-16 DIAGNOSIS — I480 Paroxysmal atrial fibrillation: Secondary | ICD-10-CM

## 2019-10-17 LAB — CBC
Hematocrit: 41.8 % (ref 37.5–51.0)
Hemoglobin: 14.6 g/dL (ref 13.0–17.7)
MCH: 30.9 pg (ref 26.6–33.0)
MCHC: 34.9 g/dL (ref 31.5–35.7)
MCV: 88 fL (ref 79–97)
Platelets: 296 10*3/uL (ref 150–450)
RBC: 4.73 x10E6/uL (ref 4.14–5.80)
RDW: 12 % (ref 11.6–15.4)
WBC: 5.8 10*3/uL (ref 3.4–10.8)

## 2019-10-17 LAB — BASIC METABOLIC PANEL
BUN/Creatinine Ratio: 14 (ref 10–24)
BUN: 13 mg/dL (ref 8–27)
CO2: 27 mmol/L (ref 20–29)
Calcium: 9.5 mg/dL (ref 8.6–10.2)
Chloride: 103 mmol/L (ref 96–106)
Creatinine, Ser: 0.94 mg/dL (ref 0.76–1.27)
GFR calc Af Amer: 95 mL/min/{1.73_m2} (ref >59)
GFR calc non Af Amer: 82 mL/min/{1.73_m2} (ref >59)
Glucose: 122 mg/dL — ABNORMAL HIGH (ref 65–99)
Potassium: 4.3 mmol/L (ref 3.5–5.2)
Sodium: 142 mmol/L (ref 134–144)

## 2019-10-17 NOTE — Telephone Encounter (Signed)
Pt had labs drawn 10/16/19 Creat 0.94, Hgb 14.6. Based on specified criteria pt is on appropriate dosage of Eliquis 5mg  BID.  Will refill rx.

## 2019-10-23 ENCOUNTER — Ambulatory Visit (INDEPENDENT_AMBULATORY_CARE_PROVIDER_SITE_OTHER): Payer: Medicare Other

## 2019-10-23 DIAGNOSIS — I255 Ischemic cardiomyopathy: Secondary | ICD-10-CM

## 2019-10-25 LAB — CUP PACEART REMOTE DEVICE CHECK
Battery Remaining Longevity: 126 mo
Battery Remaining Percentage: 100 %
Brady Statistic RA Percent Paced: 2 %
Brady Statistic RV Percent Paced: 9 %
Date Time Interrogation Session: 20211014044100
HighPow Impedance: 71 Ohm
Implantable Lead Implant Date: 20180703
Implantable Lead Implant Date: 20180703
Implantable Lead Implant Date: 20180703
Implantable Lead Location: 753858
Implantable Lead Location: 753859
Implantable Lead Location: 753860
Implantable Lead Model: 293
Implantable Lead Model: 4674
Implantable Lead Model: 7741
Implantable Lead Serial Number: 433305
Implantable Lead Serial Number: 801469
Implantable Lead Serial Number: 901025
Implantable Pulse Generator Implant Date: 20180703
Lead Channel Impedance Value: 416 Ohm
Lead Channel Impedance Value: 558 Ohm
Lead Channel Impedance Value: 562 Ohm
Lead Channel Setting Pacing Amplitude: 1.5 V
Lead Channel Setting Pacing Amplitude: 2 V
Lead Channel Setting Pacing Amplitude: 2 V
Lead Channel Setting Pacing Pulse Width: 0.4 ms
Lead Channel Setting Pacing Pulse Width: 0.4 ms
Lead Channel Setting Sensing Sensitivity: 0.6 mV
Lead Channel Setting Sensing Sensitivity: 1 mV
Pulse Gen Serial Number: 169965

## 2019-10-28 NOTE — Progress Notes (Signed)
Remote ICD transmission.   

## 2019-11-14 DIAGNOSIS — Z23 Encounter for immunization: Secondary | ICD-10-CM | POA: Diagnosis not present

## 2019-11-16 ENCOUNTER — Other Ambulatory Visit: Payer: Self-pay | Admitting: Interventional Cardiology

## 2019-11-18 ENCOUNTER — Encounter: Payer: Self-pay | Admitting: Interventional Cardiology

## 2019-11-18 MED ORDER — ICOSAPENT ETHYL 1 G PO CAPS: 2.0000 g | ORAL_CAPSULE | Freq: Two times a day (BID) | ORAL | 2 refills | Status: DC

## 2019-11-18 NOTE — Telephone Encounter (Signed)
Error

## 2019-12-18 DIAGNOSIS — I480 Paroxysmal atrial fibrillation: Secondary | ICD-10-CM | POA: Diagnosis not present

## 2019-12-18 DIAGNOSIS — N400 Enlarged prostate without lower urinary tract symptoms: Secondary | ICD-10-CM | POA: Diagnosis not present

## 2019-12-18 DIAGNOSIS — E230 Hypopituitarism: Secondary | ICD-10-CM | POA: Diagnosis not present

## 2019-12-18 DIAGNOSIS — I25709 Atherosclerosis of coronary artery bypass graft(s), unspecified, with unspecified angina pectoris: Secondary | ICD-10-CM | POA: Diagnosis not present

## 2019-12-18 DIAGNOSIS — I471 Supraventricular tachycardia: Secondary | ICD-10-CM | POA: Diagnosis not present

## 2019-12-28 ENCOUNTER — Other Ambulatory Visit: Payer: Self-pay | Admitting: Interventional Cardiology

## 2020-01-06 ENCOUNTER — Telehealth: Payer: Self-pay | Admitting: Pharmacist

## 2020-01-06 NOTE — Telephone Encounter (Signed)
Called patient to advise him that the form he filled out was an old form. Also advised that PASS now requires that he spend $500 per household out of pocket before he can be approved. This is per calendar year, so what he spent in 2021 does not count. Advised that he wait until he spends this out of pocket and bring in his proof and fill out new application. He can use his insurance to pay for praluent and use that money towards out of pocket expenses.

## 2020-01-06 NOTE — Telephone Encounter (Signed)
Patient called stating he got a letter that he needed to reapply for PASS (Praluent patient assistance) I will print application and leave at front desk for patient to fill out.

## 2020-01-13 ENCOUNTER — Other Ambulatory Visit: Payer: Self-pay | Admitting: Pharmacist

## 2020-01-13 MED ORDER — PRALUENT 75 MG/ML ~~LOC~~ SOAJ: 1.0000 "pen " | SUBCUTANEOUS | 3 refills | Status: DC

## 2020-01-13 NOTE — Telephone Encounter (Signed)
Pt stopped by clinic with PASS paperwork. He has spent > $3,000 on meds last year but PASS will require $500 out of pocket in this calendar year unfortunately. Instead, I re-enrolled pt back in [employer] since this opened back up yesterday. Pt provided with new pharmacy ID to bring the next time he gets his Praluent refilled. Pt appreciative for all of the assistance.

## 2020-01-22 ENCOUNTER — Ambulatory Visit (INDEPENDENT_AMBULATORY_CARE_PROVIDER_SITE_OTHER): Payer: Medicare Other

## 2020-01-22 DIAGNOSIS — I255 Ischemic cardiomyopathy: Secondary | ICD-10-CM | POA: Diagnosis not present

## 2020-01-22 LAB — CUP PACEART REMOTE DEVICE CHECK
Battery Remaining Longevity: 126 mo
Battery Remaining Percentage: 100 %
Brady Statistic RA Percent Paced: 2 %
Brady Statistic RV Percent Paced: 9 %
Date Time Interrogation Session: 20220113044100
HighPow Impedance: 74 Ohm
Implantable Lead Implant Date: 20180703
Implantable Lead Implant Date: 20180703
Implantable Lead Implant Date: 20180703
Implantable Lead Location: 753858
Implantable Lead Location: 753859
Implantable Lead Location: 753860
Implantable Lead Model: 293
Implantable Lead Model: 4674
Implantable Lead Model: 7741
Implantable Lead Serial Number: 433305
Implantable Lead Serial Number: 801469
Implantable Lead Serial Number: 901025
Implantable Pulse Generator Implant Date: 20180703
Lead Channel Impedance Value: 426 Ohm
Lead Channel Impedance Value: 566 Ohm
Lead Channel Impedance Value: 584 Ohm
Lead Channel Setting Pacing Amplitude: 1.5 V
Lead Channel Setting Pacing Amplitude: 2 V
Lead Channel Setting Pacing Amplitude: 2 V
Lead Channel Setting Pacing Pulse Width: 0.4 ms
Lead Channel Setting Pacing Pulse Width: 0.4 ms
Lead Channel Setting Sensing Sensitivity: 0.6 mV
Lead Channel Setting Sensing Sensitivity: 1 mV
Pulse Gen Serial Number: 169965

## 2020-02-04 NOTE — Progress Notes (Signed)
Remote ICD transmission.   

## 2020-02-22 ENCOUNTER — Other Ambulatory Visit: Payer: Self-pay | Admitting: Interventional Cardiology

## 2020-02-28 ENCOUNTER — Other Ambulatory Visit: Payer: Self-pay | Admitting: Interventional Cardiology

## 2020-03-02 ENCOUNTER — Other Ambulatory Visit: Payer: Self-pay

## 2020-03-02 ENCOUNTER — Other Ambulatory Visit: Payer: Medicare Other | Admitting: *Deleted

## 2020-03-02 DIAGNOSIS — E782 Mixed hyperlipidemia: Secondary | ICD-10-CM

## 2020-03-02 LAB — HEPATIC FUNCTION PANEL
ALT: 23 IU/L (ref 0–44)
AST: 20 IU/L (ref 0–40)
Albumin: 4.7 g/dL (ref 3.8–4.8)
Alkaline Phosphatase: 78 IU/L (ref 44–121)
Bilirubin Total: 0.6 mg/dL (ref 0.0–1.2)
Bilirubin, Direct: 0.18 mg/dL (ref 0.00–0.40)
Total Protein: 6.5 g/dL (ref 6.0–8.5)

## 2020-03-02 LAB — LIPID PANEL
Chol/HDL Ratio: 2.8 ratio (ref 0.0–5.0)
Cholesterol, Total: 113 mg/dL (ref 100–199)
HDL: 41 mg/dL (ref >39)
LDL Chol Calc (NIH): 48 mg/dL (ref 0–99)
Triglycerides: 138 mg/dL (ref 0–149)
VLDL Cholesterol Cal: 24 mg/dL (ref 5–40)

## 2020-03-02 NOTE — Progress Notes (Signed)
Cardiology Office Note:    Date:  03/03/2020   ID:  Tommy [name], DOB 1950/10/14, MRN [id number]  PCP:  Patient, No Pcp Per  Cardiologist:  Sinclair Grooms, MD   Referring MD: No ref. provider found   Chief Complaint  Patient presents with  . Coronary Artery Disease  . Congestive Heart Failure    History of Present Illness:    Tommy [name] is a 70 y.o. male with a hx of ischemic cardiomyopathy, exertional angina pectoris, prior coronary bypass grafting 2002 with bypass graft failure and multiple native and bypass graft stents most recently October 2018, CRT-D 2018, hypertension, hyperlipidemia and chronic systolic heart failure,AICD,brief atrial fibrillationon telemetry, and OSA.  Tommy [name] is doing well.  He voices no specific cardiovascular complaints.  He is exercising more than 240 minutes/week.  No symptoms.  He denies orthopnea, PND, palpitations, claudication, syncope, or nitroglycerin use.  No medication side effects other than bruising.  Past Medical History:  Diagnosis Date  . AICD (automatic cardioverter/defibrillator) present    BIV  . Anginal pain ([place])   . Blood transfusion without reported diagnosis   . CAD (coronary artery disease)    with CABG LIMA to LAD, free radial PDA, SVG to diagonal, SVG to OM. 2002. DES May 2011 and 01-2010 to SVG of Diag, native OM, and Native RCA. Unable to stent LAD via LIMA10/18 PCI to dRCA, multi-site DES to SVG--> OM  . GERD (gastroesophageal reflux disease)   . Hyperlipidemia    unable to tolarate statin therapy  . Hypertension   . Kidney stone   . OSA on CPAP 12/13/2017   Severe obstructive sleep apnea with an AHI of 30.3/h and no significant central sleep apnea.  His oxygen saturations dropped to 83%.  He is on CPAP at 8 cm H2O.  . Vertigo     Past Surgical History:  Procedure Laterality Date  . BIV ICD INSERTION CRT-D N/A 07/11/2016   Procedure: BiV ICD Insertion CRT-D;  Surgeon: Evans Lance, MD;  Location: [facility];  Service: Cardiovascular;  Laterality: N/A;  . CHOLECYSTECTOMY    . COLONOSCOPY    . CORONARY ARTERY BYPASS GRAFT    . CORONARY BALLOON ANGIOPLASTY N/A 10/12/2016   Procedure: CORONARY BALLOON ANGIOPLASTY;  Surgeon: Belva Crome, MD;  Location: [facility];  Service: Cardiovascular;  Laterality: N/A;  . CORONARY STENT INTERVENTION  10/12/2016   PTCA of focal in-stent restenosis in the second obtuse marginal reducing 90% stenosis to 0%.  . CORONARY STENT INTERVENTION N/A 10/12/2016   Procedure: CORONARY STENT INTERVENTION;  Surgeon: Belva Crome, MD;  Location: [facility];  Service: Cardiovascular;  Laterality: N/A;  . LEFT HEART CATH AND CORS/GRAFTS ANGIOGRAPHY N/A 10/12/2016   Procedure: LEFT HEART CATH AND CORS/GRAFTS ANGIOGRAPHY;  Surgeon: Belva Crome, MD;  Location: [facility];  Service: Cardiovascular;  Laterality: N/A;  . LEFT HEART CATH AND CORS/GRAFTS ANGIOGRAPHY N/A 01/08/2018   Procedure: LEFT HEART CATH AND CORS/GRAFTS ANGIOGRAPHY;  Surgeon: Belva Crome, MD;  Location: [facility];  Service: Cardiovascular;  Laterality: N/A;  . RIGHT/LEFT HEART CATH AND CORONARY/GRAFT ANGIOGRAPHY N/A 03/01/2016   Procedure: Right/Left Heart Cath and Coronary/Graft Angiography;  Surgeon: Belva Crome, MD;  Location: [facility];  Service: Cardiovascular;  Laterality: N/A;  . ULTRASOUND GUIDANCE FOR VASCULAR ACCESS  01/08/2018   Procedure: Ultrasound Guidance For Vascular Access;  Surgeon: Belva Crome, MD;  Location: [facility];  Service: Cardiovascular;;    Current Medications: No outpatient medications have been marked as taking for the 03/03/20 encounter (Office Visit) with Belva Crome, MD.     Allergies:   Fish oil   Social History   Socioeconomic History  . Marital status: Married    Spouse name: Not on file  . Number of children: Not on file  . Years of education: Not on file  . Highest education level: Not on file   Occupational History  . Not on file  Tobacco Use  . Smoking status: Never Smoker  . Smokeless tobacco: Never Used  Vaping Use  . Vaping Use: Never used  Substance and Sexual Activity  . Alcohol use: Yes    Alcohol/week: 1.0 standard drink    Types: 1 Glasses of wine per week    Comment: RARE  . Drug use: No  . Sexual activity: Not on file  Other Topics Concern  . Not on file  Social History Narrative  . Not on file   Social Determinants of Health   Financial Resource Strain: Not on file  Food Insecurity: Not on file  Transportation Needs: Not on file  Physical Activity: Not on file  Stress: Not on file  Social Connections: Not on file     Family History: The patient's family history includes Colon cancer in his father. There is no history of Sudden death, Hypertension, Hyperlipidemia, Heart attack, Diabetes, Rectal cancer, or Stomach cancer.  ROS:   Please see the history of present illness.    Appetite is stable.  Discussed today that he is developing some abdominal fat.  All other systems reviewed and are negative.  EKGs/Labs/Other Studies Reviewed:    The following studies were reviewed today: No new data  Last device check was unremarkable.  EKG:  EKG not performed  Recent Labs: 10/16/2019: BUN 13; Creatinine, Ser 0.94; Hemoglobin 14.6; Platelets 296; Potassium 4.3; Sodium 142 03/02/2020: ALT 23  Recent Lipid Panel    Component Value Date/Time   CHOL 113 03/02/2020 0804   TRIG 138 03/02/2020 0804   HDL 41 03/02/2020 0804   CHOLHDL 2.8 03/02/2020 0804   CHOLHDL 3.3 03/03/2015 0744   VLDL 22 03/03/2015 0744   LDLCALC 48 03/02/2020 0804   LDLDIRECT 81 10/24/2016 1119   LDLDIRECT 91.0 02/05/2014 0809    Physical Exam:    VS:  There were no vitals taken for this visit.    Wt Readings from Last 3 Encounters:  08/27/19 192 lb 12.8 oz (87.5 kg)  02/11/19 197 lb 12.8 oz (89.7 kg)  10/10/18 195 lb (88.5 kg)     GEN: Appears healthy. No acute  distress HEENT: Normal NECK: No JVD. LYMPHATICS: No lymphadenopathy CARDIAC: No murmur. RRR no gallop, or edema. VASCULAR:  Normal Pulses. No bruits. RESPIRATORY:  Clear to auscultation without rales, wheezing or rhonchi  ABDOMEN: Soft, non-tender, non-distended, No pulsatile mass, MUSCULOSKELETAL: No deformity  SKIN: Warm and dry NEUROLOGIC:  Alert and oriented x 3 PSYCHIATRIC:  Normal affect   ASSESSMENT:    1. Chronic systolic heart failure ([place])   2. Coronary artery disease involving coronary bypass graft of native heart with angina pectoris (HCC)   3. Paroxysmal atrial fibrillation ([place])   4. Severe obstructive sleep apnea   5. LBBB (left bundle branch block)   6. Biventricular automatic implantable cardioverter defibrillator in situ   7. HYPERTENSION, BENIGN SYSTEMIC   8. Educated about COVID-19 virus infection  PLAN:    In order of problems listed above:  1. Heart failure therapy includes beta-blocker (Toprol-XL 25 mg daily), Entresto 49/51 mg twice daily.  MRA and SGLT2 therapy are not currently being used.  Next addition may well be an SGLT2 if there is deterioration in LV function.  Echocardiogram will be done prior to the next visit in 6 months. 2. Secondary risk prevention is reviewed.  He is doing great.  He is receiving protective benefit from [place].  Also on Praluent. 3. Continue Eliquis 5 mg twice daily.  Twice yearly creatinine and hemoglobin. 4. Compliance with CPAP is being observed. 5. As resynchronization.  EKG not performed today. 6. No discharges. 7. Target blood pressure 130/80 or less. 8. Boosted, practicing medication, masking when appropriate.  Overall education and awareness concerning secondary risk prevention was discussed in detail: LDL less than 70, hemoglobin A1c less than 7, blood pressure target less than 130/80 mmHg, >150 minutes of moderate aerobic activity per week, avoidance of smoking, weight control (via diet and exercise), and  continued surveillance/management of/for obstructive sleep apnea.  Guideline directed therapy for left ventricular systolic dysfunction: Angiotensin receptor-neprilysin inhibitor (ARNI)-Entresto; beta-blocker therapy - carvedilol, metoprolol succinate, or bisoprolol; mineralocorticoid receptor antagonist (MRA) therapy -spironolactone or eplerenone.  SGLT-2 agents -  Dapagliflozin [place]) or Empagliflozin ([place]).These therapies have been shown to improve clinical outcomes including reduction of rehospitalization, survival, and acute heart failure.     Medication Adjustments/Labs and Tests Ordered: Current medicines are reviewed at length with the patient today.  Concerns regarding medicines are outlined above.  No orders of the defined types were placed in this encounter.  No orders of the defined types were placed in this encounter.   There are no Patient Instructions on file for this visit.   Signed, Sinclair Grooms, MD  03/03/2020 8:04 AM    [place]

## 2020-03-03 ENCOUNTER — Ambulatory Visit (INDEPENDENT_AMBULATORY_CARE_PROVIDER_SITE_OTHER): Payer: Medicare Other | Admitting: Interventional Cardiology

## 2020-03-03 ENCOUNTER — Encounter: Payer: Self-pay | Admitting: Interventional Cardiology

## 2020-03-03 VITALS — BP 128/80 | HR 75 | Ht 72.0 in | Wt 200.0 lb

## 2020-03-03 DIAGNOSIS — I5022 Chronic systolic (congestive) heart failure: Secondary | ICD-10-CM | POA: Diagnosis not present

## 2020-03-03 DIAGNOSIS — Z7189 Other specified counseling: Secondary | ICD-10-CM

## 2020-03-03 DIAGNOSIS — I10 Essential (primary) hypertension: Secondary | ICD-10-CM

## 2020-03-03 DIAGNOSIS — I25709 Atherosclerosis of coronary artery bypass graft(s), unspecified, with unspecified angina pectoris: Secondary | ICD-10-CM | POA: Diagnosis not present

## 2020-03-03 DIAGNOSIS — I480 Paroxysmal atrial fibrillation: Secondary | ICD-10-CM | POA: Diagnosis not present

## 2020-03-03 DIAGNOSIS — I255 Ischemic cardiomyopathy: Secondary | ICD-10-CM

## 2020-03-03 DIAGNOSIS — G4733 Obstructive sleep apnea (adult) (pediatric): Secondary | ICD-10-CM | POA: Diagnosis not present

## 2020-03-03 DIAGNOSIS — Z95810 Presence of automatic (implantable) cardiac defibrillator: Secondary | ICD-10-CM

## 2020-03-03 DIAGNOSIS — I447 Left bundle-branch block, unspecified: Secondary | ICD-10-CM

## 2020-03-03 NOTE — Patient Instructions (Signed)
Medication Instructions:  Your physician recommends that you continue on your current medications as directed. Please refer to the Current Medication list given to you today.  *If you need a refill on your cardiac medications before your next appointment, please call your pharmacy*   Lab Work: None If you have labs (blood work) drawn today and your tests are completely normal, you will receive your results only by: Marland Kitchen MyChart Message (if you have MyChart) OR . A paper copy in the mail If you have any lab test that is abnormal or we need to change your treatment, we will call you to review the results.   Testing/Procedures: Your physician has requested that you have an echocardiogram 1-2 weeks prior to seeing Dr. Tamala Julian back in 6-8 months. Echocardiography is a painless test that uses sound waves to create images of your heart. It provides your doctor with information about the size and shape of your heart and how well your heart's chambers and valves are working. This procedure takes approximately one hour. There are no restrictions for this procedure.   Follow-Up: At [facility], you and your health needs are our priority.  As part of our continuing mission to provide you with exceptional heart care, we have created designated Provider Care Teams.  These Care Teams include your primary Cardiologist (physician) and Advanced Practice Providers (APPs -  Physician Assistants and Nurse Practitioners) who all work together to provide you with the care you need, when you need it.  We recommend signing up for the patient portal called "MyChart".  Sign up information is provided on this After Visit Summary.  MyChart is used to connect with patients for Virtual Visits (Telemedicine).  Patients are able to view lab/test results, encounter notes, upcoming appointments, etc.  Non-urgent messages can be sent to your provider as well.   To learn more about what you can do with MyChart, go to  [email or web address].    Your next appointment:   6 month(s)- please go ahead and schedule  The format for your next appointment:   In Person  Provider:   You may see Sinclair Grooms, MD or one of the following Advanced Practice Providers on your designated Care Team:    Kathyrn Drown, NP    Other Instructions

## 2020-04-22 ENCOUNTER — Other Ambulatory Visit: Payer: Self-pay

## 2020-04-22 ENCOUNTER — Ambulatory Visit (INDEPENDENT_AMBULATORY_CARE_PROVIDER_SITE_OTHER): Payer: Medicare Other | Admitting: Physician Assistant

## 2020-04-22 ENCOUNTER — Ambulatory Visit (INDEPENDENT_AMBULATORY_CARE_PROVIDER_SITE_OTHER): Payer: Medicare Other

## 2020-04-22 ENCOUNTER — Encounter: Payer: Self-pay | Admitting: Physician Assistant

## 2020-04-22 DIAGNOSIS — L814 Other melanin hyperpigmentation: Secondary | ICD-10-CM

## 2020-04-22 DIAGNOSIS — L578 Other skin changes due to chronic exposure to nonionizing radiation: Secondary | ICD-10-CM

## 2020-04-22 DIAGNOSIS — L570 Actinic keratosis: Secondary | ICD-10-CM

## 2020-04-22 DIAGNOSIS — D485 Neoplasm of uncertain behavior of skin: Secondary | ICD-10-CM

## 2020-04-22 DIAGNOSIS — I255 Ischemic cardiomyopathy: Secondary | ICD-10-CM | POA: Diagnosis not present

## 2020-04-22 DIAGNOSIS — D1800 Hemangioma unspecified site: Secondary | ICD-10-CM | POA: Diagnosis not present

## 2020-04-22 DIAGNOSIS — L821 Other seborrheic keratosis: Secondary | ICD-10-CM | POA: Diagnosis not present

## 2020-04-22 DIAGNOSIS — Z1283 Encounter for screening for malignant neoplasm of skin: Secondary | ICD-10-CM

## 2020-04-22 DIAGNOSIS — I447 Left bundle-branch block, unspecified: Secondary | ICD-10-CM | POA: Diagnosis not present

## 2020-04-22 DIAGNOSIS — C44311 Basal cell carcinoma of skin of nose: Secondary | ICD-10-CM

## 2020-04-22 NOTE — Progress Notes (Signed)
   Follow-Up Visit   Subjective  Tommy [name] is a 70 y.o. male who presents for the following: Annual Exam (Left nose pink x5 months bleeding nonhealing lesion, check lesion on his back he dont like. No history of bcc scc or atypical moles).   The following portions of the chart were reviewed this encounter and updated as appropriate:  Tobacco  Allergies  Meds  Problems  Med Hx  Surg Hx  Fam Hx      Objective  Well appearing patient in no apparent distress; mood and affect are within normal limits.  All skin waist up examined.  Objective  Mid Root of Nose: Hyperkeratotic scale with pink base        Objective  Left Nasal Sidewall: Hyperkeratotic scale with pink base         Assessment & Plan  Neoplasm of uncertain behavior of skin (2) Mid Root of Nose  Skin / nail biopsy Type of biopsy: tangential   Informed consent: discussed and consent obtained   Timeout: patient name, date of birth, surgical site, and procedure verified   Procedure prep:  Patient was prepped and draped in usual sterile fashion (Non sterile) Prep type:  Chlorhexidine Anesthesia: the lesion was anesthetized in a standard fashion   Anesthetic:  1% lidocaine w/ epinephrine 1-100,000 local infiltration Instrument used: flexible razor blade   Hemostasis achieved with: aluminum chloride and electrodesiccation   Outcome: patient tolerated procedure well   Post-procedure details: sterile dressing applied and wound care instructions given   Dressing type: bandage and petrolatum    Specimen 1 - Surgical pathology Differential Diagnosis: R/O BCC vs SCC - cautery  Check Margins: No  Left Nasal Sidewall  Skin / nail biopsy Type of biopsy: tangential   Informed consent: discussed and consent obtained   Timeout: patient name, date of birth, surgical site, and procedure verified   Procedure prep:  Patient was prepped and draped in usual sterile fashion (Non sterile) Prep type:   Chlorhexidine Anesthesia: the lesion was anesthetized in a standard fashion   Anesthetic:  1% lidocaine w/ epinephrine 1-100,000 local infiltration Instrument used: flexible razor blade   Hemostasis achieved with: aluminum chloride and electrodesiccation   Outcome: patient tolerated procedure well   Post-procedure details: sterile dressing applied and wound care instructions given   Dressing type: bandage and petrolatum    Specimen 2 - Surgical pathology Differential Diagnosis: R/O BCC vs SCC - cautery  Check Margins: No   Lentigines - Scattered tan macules - Discussed due to sun exposure - Benign, observe - Call for any changes  Seborrheic Keratoses - Stuck-on, waxy, tan-brown papules and plaques  - Discussed benign etiology and prognosis. - Observe - Call for any changes  Hemangiomas - Red papules - Discussed benign nature - Observe - Call for any changes  Actinic Damage - diffuse scaly erythematous macules with underlying dyspigmentation - Recommend daily broad spectrum sunscreen SPF 30+ to sun-exposed areas, reapply every 2 hours as needed.  - Call for new or changing lesions.  Skin cancer screening performed today.  I, Elica Almas, PA-C, have reviewed all documentation's for this visit.  The documentation on 04/22/20 for the exam, diagnosis, procedures and orders are all accurate and complete.

## 2020-04-22 NOTE — Patient Instructions (Signed)

## 2020-04-23 LAB — CUP PACEART REMOTE DEVICE CHECK
Battery Remaining Longevity: 114 mo
Battery Remaining Percentage: 100 %
Brady Statistic RA Percent Paced: 2 %
Brady Statistic RV Percent Paced: 9 %
Date Time Interrogation Session: 20220414050800
HighPow Impedance: 72 Ohm
Implantable Lead Implant Date: 20180703
Implantable Lead Implant Date: 20180703
Implantable Lead Implant Date: 20180703
Implantable Lead Location: 753858
Implantable Lead Location: 753859
Implantable Lead Location: 753860
Implantable Lead Model: 293
Implantable Lead Model: 4674
Implantable Lead Model: 7741
Implantable Lead Serial Number: 433305
Implantable Lead Serial Number: 801469
Implantable Lead Serial Number: 901025
Implantable Pulse Generator Implant Date: 20180703
Lead Channel Impedance Value: 439 Ohm
Lead Channel Impedance Value: 567 Ohm
Lead Channel Impedance Value: 572 Ohm
Lead Channel Setting Pacing Amplitude: 1.5 V
Lead Channel Setting Pacing Amplitude: 2 V
Lead Channel Setting Pacing Amplitude: 2 V
Lead Channel Setting Pacing Pulse Width: 0.4 ms
Lead Channel Setting Pacing Pulse Width: 0.4 ms
Lead Channel Setting Sensing Sensitivity: 0.6 mV
Lead Channel Setting Sensing Sensitivity: 1 mV
Pulse Gen Serial Number: 169965

## 2020-04-29 ENCOUNTER — Telehealth: Payer: Self-pay

## 2020-04-29 NOTE — Telephone Encounter (Signed)
Left message to call patient needs mohs

## 2020-04-29 NOTE — Telephone Encounter (Signed)
-----   Message from Warren Danes, [place] sent at 04/29/2020  8:13 AM EDT ----- Mohs

## 2020-05-03 ENCOUNTER — Telehealth: Payer: Self-pay | Admitting: *Deleted

## 2020-05-03 NOTE — Telephone Encounter (Signed)
-----   Message from Kelli R Sheffield, PA-C sent at 04/29/2020  8:13 AM EDT ----- Mohs  

## 2020-05-03 NOTE — Telephone Encounter (Signed)
Left message for patient to return our phone call.

## 2020-05-06 ENCOUNTER — Telehealth: Payer: Self-pay

## 2020-05-06 NOTE — Telephone Encounter (Signed)
-----   Message from Kelli R Sheffield, PA-C sent at 04/29/2020  8:13 AM EDT ----- Mohs  

## 2020-05-06 NOTE — Telephone Encounter (Signed)
Phone call from patient returning our call for his pathology results.  Pathology results given to patient, [facility] information sent.

## 2020-05-07 NOTE — Progress Notes (Signed)
Remote ICD transmission.   

## 2020-05-10 ENCOUNTER — Other Ambulatory Visit: Payer: Self-pay | Admitting: Interventional Cardiology

## 2020-05-10 DIAGNOSIS — I480 Paroxysmal atrial fibrillation: Secondary | ICD-10-CM

## 2020-05-10 NOTE — Telephone Encounter (Signed)
Prescription refill request for Eliquis received. Indication: afib  Last office visit: Tamala Julian 03/03/2020 Scr: 0.94, 10/16/2019 Age: 70 yo  Weight: 90.7 kg   Pt is on the correct dose of Eliquis per dosing criteria, prescription refill sent for Eliquis 5mg  BID.

## 2020-05-12 ENCOUNTER — Ambulatory Visit (INDEPENDENT_AMBULATORY_CARE_PROVIDER_SITE_OTHER): Payer: Medicare Other | Admitting: Physician Assistant

## 2020-05-12 ENCOUNTER — Other Ambulatory Visit: Payer: Self-pay

## 2020-05-12 DIAGNOSIS — C44319 Basal cell carcinoma of skin of other parts of face: Secondary | ICD-10-CM | POA: Diagnosis not present

## 2020-06-01 ENCOUNTER — Encounter: Payer: Self-pay | Admitting: Physician Assistant

## 2020-06-01 NOTE — Progress Notes (Signed)
   Follow-Up Visit   Subjective  Tommy [name] is a 70 y.o. male who presents for the following: Follow-up.   The following portions of the chart were reviewed this encounter and updated as appropriate:  Tobacco  Allergies  Meds  Problems  Med Hx  Surg Hx  Fam Hx      Objective  Well appearing patient in no apparent distress; mood and affect are within normal limits.  Face was examined today.  Objective  mid root of nose: Pink macule   Assessment & Plan  Basal cell carcinoma (BCC) of skin of face, unspecified part of face mid root of nose  Discussed Moh's procedure in detail.    I, Kiyani Jernigan, PA-C, have reviewed all documentation's for this visit.  The documentation on 06/01/20 for the exam, diagnosis, procedures and orders are all accurate and complete.

## 2020-06-18 ENCOUNTER — Other Ambulatory Visit: Payer: Self-pay | Admitting: Interventional Cardiology

## 2020-06-18 MED ORDER — ICOSAPENT ETHYL 1 G PO CAPS: 2.0000 g | ORAL_CAPSULE | Freq: Two times a day (BID) | ORAL | 2 refills | Status: DC

## 2020-06-18 MED ORDER — ENTRESTO 49-51 MG PO TABS: 1.0000 | ORAL_TABLET | Freq: Two times a day (BID) | ORAL | 2 refills | Status: DC

## 2020-07-22 ENCOUNTER — Ambulatory Visit (INDEPENDENT_AMBULATORY_CARE_PROVIDER_SITE_OTHER): Payer: Medicare Other

## 2020-07-22 DIAGNOSIS — I447 Left bundle-branch block, unspecified: Secondary | ICD-10-CM | POA: Diagnosis not present

## 2020-07-22 LAB — CUP PACEART REMOTE DEVICE CHECK
Battery Remaining Longevity: 114 mo
Battery Remaining Percentage: 100 %
Brady Statistic RA Percent Paced: 2 %
Brady Statistic RV Percent Paced: 9 %
Date Time Interrogation Session: 20220714044100
HighPow Impedance: 80 Ohm
Implantable Lead Implant Date: 20180703
Implantable Lead Implant Date: 20180703
Implantable Lead Implant Date: 20180703
Implantable Lead Location: 753858
Implantable Lead Location: 753859
Implantable Lead Location: 753860
Implantable Lead Model: 293
Implantable Lead Model: 4674
Implantable Lead Model: 7741
Implantable Lead Serial Number: 433305
Implantable Lead Serial Number: 801469
Implantable Lead Serial Number: 901025
Implantable Pulse Generator Implant Date: 20180703
Lead Channel Impedance Value: 453 Ohm
Lead Channel Impedance Value: 589 Ohm
Lead Channel Impedance Value: 610 Ohm
Lead Channel Setting Pacing Amplitude: 1.5 V
Lead Channel Setting Pacing Amplitude: 2 V
Lead Channel Setting Pacing Amplitude: 2 V
Lead Channel Setting Pacing Pulse Width: 0.4 ms
Lead Channel Setting Pacing Pulse Width: 0.4 ms
Lead Channel Setting Sensing Sensitivity: 0.6 mV
Lead Channel Setting Sensing Sensitivity: 1 mV
Pulse Gen Serial Number: 169965

## 2020-08-14 NOTE — Progress Notes (Signed)
Remote ICD transmission.   

## 2020-08-20 ENCOUNTER — Other Ambulatory Visit (HOSPITAL_COMMUNITY): Payer: Medicare Other

## 2020-08-23 ENCOUNTER — Telehealth: Payer: Self-pay | Admitting: Interventional Cardiology

## 2020-08-23 DIAGNOSIS — I25709 Atherosclerosis of coronary artery bypass graft(s), unspecified, with unspecified angina pectoris: Secondary | ICD-10-CM

## 2020-08-23 DIAGNOSIS — I480 Paroxysmal atrial fibrillation: Secondary | ICD-10-CM

## 2020-08-23 DIAGNOSIS — E782 Mixed hyperlipidemia: Secondary | ICD-10-CM

## 2020-08-23 NOTE — Telephone Encounter (Signed)
Left detailed message letting pt know that I have ordered labs and to make sure he goes by before he leaves tomorrow.   Order placed for CBC and BMET as well to follow up on Eliquis.

## 2020-08-23 NOTE — Telephone Encounter (Signed)
Tommy [name] is calling back stating he would like a Lipid blood panel request be made and scheduled for him to have performed tomorrow when he comes in for his Echo so his cholesterol can be checked. He states he will come in fasting. Would like a callback confirming the order was made and scheduled. Please advise.

## 2020-08-23 NOTE — Telephone Encounter (Signed)
New message:     Patient calling stating that the nurse called him and he is retuning her call back.

## 2020-08-23 NOTE — Telephone Encounter (Signed)
Left message letting pt know that I have not called him. Advised if they left a message with a name, please feel free to call back.  Left echo appt information as well.

## 2020-08-24 ENCOUNTER — Other Ambulatory Visit: Payer: Self-pay

## 2020-08-24 ENCOUNTER — Ambulatory Visit (HOSPITAL_COMMUNITY): Payer: Medicare Other | Attending: Cardiovascular Disease

## 2020-08-24 ENCOUNTER — Other Ambulatory Visit: Payer: Medicare Other | Admitting: *Deleted

## 2020-08-24 DIAGNOSIS — E782 Mixed hyperlipidemia: Secondary | ICD-10-CM

## 2020-08-24 DIAGNOSIS — I5022 Chronic systolic (congestive) heart failure: Secondary | ICD-10-CM | POA: Diagnosis present

## 2020-08-24 DIAGNOSIS — I25709 Atherosclerosis of coronary artery bypass graft(s), unspecified, with unspecified angina pectoris: Secondary | ICD-10-CM

## 2020-08-24 DIAGNOSIS — I480 Paroxysmal atrial fibrillation: Secondary | ICD-10-CM

## 2020-08-24 LAB — HEPATIC FUNCTION PANEL
ALT: 30 IU/L (ref 0–44)
AST: 20 IU/L (ref 0–40)
Albumin: 4.7 g/dL (ref 3.8–4.8)
Alkaline Phosphatase: 75 IU/L (ref 44–121)
Bilirubin Total: 0.6 mg/dL (ref 0.0–1.2)
Bilirubin, Direct: 0.16 mg/dL (ref 0.00–0.40)
Total Protein: 6.6 g/dL (ref 6.0–8.5)

## 2020-08-24 LAB — ECHOCARDIOGRAM COMPLETE
Area-P 1/2: 4.46 cm2
MV M vel: 5.49 m/s
MV Peak grad: 120.6 mmHg
Radius: 0.45 cm
S' Lateral: 3.9 cm

## 2020-08-24 LAB — BASIC METABOLIC PANEL WITH GFR
BUN/Creatinine Ratio: 18 (ref 10–24)
BUN: 17 mg/dL (ref 8–27)
CO2: 23 mmol/L (ref 20–29)
Calcium: 9.3 mg/dL (ref 8.6–10.2)
Chloride: 102 mmol/L (ref 96–106)
Creatinine, Ser: 0.96 mg/dL (ref 0.76–1.27)
Glucose: 117 mg/dL — ABNORMAL HIGH (ref 65–99)
Potassium: 4.3 mmol/L (ref 3.5–5.2)
Sodium: 139 mmol/L (ref 134–144)
eGFR: 85 mL/min/{1.73_m2}

## 2020-08-24 LAB — CBC
Hematocrit: 41.1 % (ref 37.5–51.0)
Hemoglobin: 14.4 g/dL (ref 13.0–17.7)
MCH: 30.9 pg (ref 26.6–33.0)
MCHC: 35 g/dL (ref 31.5–35.7)
MCV: 88 fL (ref 79–97)
Platelets: 285 10*3/uL (ref 150–450)
RBC: 4.66 x10E6/uL (ref 4.14–5.80)
RDW: 11.9 % (ref 11.6–15.4)
WBC: 6.6 10*3/uL (ref 3.4–10.8)

## 2020-08-24 LAB — LIPID PANEL
Chol/HDL Ratio: 2.7 ratio (ref 0.0–5.0)
Cholesterol, Total: 133 mg/dL (ref 100–199)
HDL: 49 mg/dL (ref >39)
LDL Chol Calc (NIH): 61 mg/dL (ref 0–99)
Triglycerides: 134 mg/dL (ref 0–149)
VLDL Cholesterol Cal: 23 mg/dL (ref 5–40)

## 2020-09-01 NOTE — Progress Notes (Signed)
Cardiology Office Note:    Date:  09/03/2020   ID:  Tommy [name], DOB 15-Jan-1950, MRN [id number]  PCP:  Patient, No Pcp Per (Inactive)  Cardiologist:  Sinclair Grooms, MD   Referring MD: No ref. provider found   Chief Complaint  Patient presents with   Coronary Artery Disease   Congestive Heart Failure    History of Present Illness:    Tommy [name] is a 70 y.o. male with a hx of ischemic cardiomyopathy, exertional angina pectoris, prior coronary bypass grafting 2002 with bypass graft failure and multiple native and bypass graft stents most recently October 2018, CRT-D 2018, hypertension, hyperlipidemia and chronic systolic heart failure, AICD, brief atrial fibrillation on telemetry, and OSA.   Tommy [name] is doing well.  He is excited about normalization of his ejection fraction.  He denies angina.  He is compliant with CPAP although it is cumbersome and difficult to wear.  He is not having claudication.  No syncope.  No significant use of sublingual nitroglycerin.  Past Medical History:  Diagnosis Date   AICD (automatic cardioverter/defibrillator) present    BIV   Anginal pain ([place])    Blood transfusion without reported diagnosis    CAD (coronary artery disease)    with CABG LIMA to LAD, free radial PDA, SVG to diagonal, SVG to OM. 2002. DES May 2011 and 01-2010 to SVG of Diag, native OM, and Native RCA. Unable to stent LAD via LIMA10/18 PCI to dRCA, multi-site DES to SVG--> OM   GERD (gastroesophageal reflux disease)    Hyperlipidemia    unable to tolarate statin therapy   Hypertension    Kidney stone    OSA on CPAP 12/13/2017   Severe obstructive sleep apnea with an AHI of 30.3/h and no significant central sleep apnea.  His oxygen saturations dropped to 83%.  He is on CPAP at 8 cm H2O.   Vertigo     Past Surgical History:  Procedure Laterality Date   BIV ICD INSERTION CRT-D N/A 07/11/2016   Procedure: BiV ICD Insertion CRT-D;  Surgeon: Evans Lance, MD;  Location: [place] [facility];  Service: Cardiovascular;  Laterality: N/A;   CHOLECYSTECTOMY     COLONOSCOPY     CORONARY ARTERY BYPASS GRAFT     CORONARY BALLOON ANGIOPLASTY N/A 10/12/2016   Procedure: CORONARY BALLOON ANGIOPLASTY;  Surgeon: Belva Crome, MD;  Location: [place] [facility];  Service: Cardiovascular;  Laterality: N/A;   CORONARY STENT INTERVENTION  10/12/2016   PTCA of focal in-stent restenosis in the second obtuse marginal reducing 90% stenosis to 0%.   CORONARY STENT INTERVENTION N/A 10/12/2016   Procedure: CORONARY STENT INTERVENTION;  Surgeon: Belva Crome, MD;  Location: [place] [facility];  Service: Cardiovascular;  Laterality: N/A;   LEFT HEART CATH AND CORS/GRAFTS ANGIOGRAPHY N/A 10/12/2016   Procedure: LEFT HEART CATH AND CORS/GRAFTS ANGIOGRAPHY;  Surgeon: Belva Crome, MD;  Location: [place] [facility];  Service: Cardiovascular;  Laterality: N/A;   LEFT HEART CATH AND CORS/GRAFTS ANGIOGRAPHY N/A 01/08/2018   Procedure: LEFT HEART CATH AND CORS/GRAFTS ANGIOGRAPHY;  Surgeon: Belva Crome, MD;  Location: [place] [facility];  Service: Cardiovascular;  Laterality: N/A;   RIGHT/LEFT HEART CATH AND CORONARY/GRAFT ANGIOGRAPHY N/A 03/01/2016   Procedure: Right/Left Heart Cath and Coronary/Graft Angiography;  Surgeon: Belva Crome, MD;  Location: [place] [facility];  Service: Cardiovascular;  Laterality: N/A;   ULTRASOUND GUIDANCE FOR VASCULAR ACCESS  01/08/2018   Procedure:  Ultrasound Guidance For Vascular Access;  Surgeon: Belva Crome, MD;  Location: [place] [facility];  Service: Cardiovascular;;    Current Medications: Current Meds  Medication Sig   Alirocumab (PRALUENT) 75 MG/ML SOAJ Inject 1 pen into the skin every 14 (fourteen) days.   cholecalciferol (VITAMIN D3) 25 MCG (1000 UT) tablet Take 1,000 Units by mouth daily.   ELIQUIS 5 MG TABS tablet TAKE 1 TABLET BY MOUTH TWICE A DAY   finasteride (PROPECIA) 1 MG tablet Take 1 mg by mouth daily.   icosapent Ethyl (VASCEPA) 1  g capsule Take 2 capsules (2 g total) by mouth 2 (two) times daily.   metoprolol succinate (TOPROL-XL) 25 MG 24 hr tablet TAKE 1 TABLET BY MOUTH DAILY. TAKE WITH OR IMMEDIATELY FOLLOWING A MEAL.   nitroGLYCERIN (NITROSTAT) 0.4 MG SL tablet Place 1 tablet (0.4 mg total) under the tongue every 5 (five) minutes as needed for chest pain (MAX 3 TABLETS).   omeprazole (PRILOSEC) 40 MG capsule TAKE 1 CAPSULE BY MOUTH EVERY DAY   sacubitril-valsartan (ENTRESTO) 49-51 MG Take 1 tablet by mouth 2 (two) times daily.   sodium chloride (MURO 128) 2 % ophthalmic solution Place 1 drop into the left eye 3 (three) times daily.   tadalafil (CIALIS) 20 MG tablet Take by mouth as needed.     Allergies:   Fish oil   Social History   Socioeconomic History   Marital status: Married    Spouse name: Not on file   Number of children: Not on file   Years of education: Not on file   Highest education level: Not on file  Occupational History   Not on file  Tobacco Use   Smoking status: Never   Smokeless tobacco: Never  Vaping Use   Vaping Use: Never used  Substance and Sexual Activity   Alcohol use: Yes    Alcohol/week: 1.0 standard drink    Types: 1 Glasses of wine per week    Comment: RARE   Drug use: No   Sexual activity: Not on file  Other Topics Concern   Not on file  Social History Narrative   Not on file   Social Determinants of Health   Financial Resource Strain: Not on file  Food Insecurity: Not on file  Transportation Needs: Not on file  Physical Activity: Not on file  Stress: Not on file  Social Connections: Not on file     Family History: The patient's family history includes Colon cancer in his father. There is no history of Sudden death, Hypertension, Hyperlipidemia, Heart attack, Diabetes, Rectal cancer, or Stomach cancer.  ROS:   Please see the history of present illness.    Denies orthopnea, PND, syncope.  Has gained weight.  No medication side effects all other systems  reviewed and are negative.  EKGs/Labs/Other Studies Reviewed:    The following studies were reviewed today:  ECHOCARDIOGRAM 2022: IMPRESSIONS     1. Mild basal to mid anterseptal and inferoseptal hypokinesis. Compared  with the echo 0000000, systolic function has improved. Left ventricular  ejection fraction, by estimation, is 50 to 55%. The left ventricle has low  normal function. The left ventricle   demonstrates regional wall motion abnormalities (see scoring  diagram/findings for description). There is mild concentric left  ventricular hypertrophy. Left ventricular diastolic parameters are  consistent with Grade I diastolic dysfunction (impaired  relaxation).   2. Right ventricular systolic function is normal. The right ventricular  size is normal. There is normal  pulmonary artery systolic pressure.   3. Left atrial size was mildly dilated.   4. The mitral valve is normal in structure. Mild mitral valve  regurgitation. No evidence of mitral stenosis.   5. The aortic valve is tricuspid. Aortic valve regurgitation is not  visualized. No aortic stenosis is present.   6. The inferior vena cava is normal in size with greater than 50%  respiratory variability, suggesting right atrial pressure of 3 mmHg.   EKG:  EKG atrial tracking with ventricular pacing.  Recent Labs: 08/24/2020: ALT 30; BUN 17; Creatinine, Ser 0.96; Hemoglobin 14.4; Platelets 285; Potassium 4.3; Sodium 139  Recent Lipid Panel    Component Value Date/Time   CHOL 133 08/24/2020 0909   TRIG 134 08/24/2020 0909   HDL 49 08/24/2020 0909   CHOLHDL 2.7 08/24/2020 0909   CHOLHDL 3.3 03/03/2015 0744   VLDL 22 03/03/2015 0744   LDLCALC 61 08/24/2020 0909   LDLDIRECT 81 10/24/2016 1119   LDLDIRECT 91.0 02/05/2014 0809    Physical Exam:    VS:  Ht 6' (1.829 m)   BMI 27.12 kg/m     Wt Readings from Last 3 Encounters:  03/03/20 200 lb (90.7 kg)  08/27/19 192 lb 12.8 oz (87.5 kg)  02/11/19 197 lb 12.8 oz (89.7  kg)     GEN: Healthy appearing. No acute distress HEENT: Normal NECK: No JVD. LYMPHATICS: No lymphadenopathy CARDIAC: No murmur. RRR no gallop, or edema. VASCULAR:  Normal Pulses. No bruits. RESPIRATORY:  Clear to auscultation without rales, wheezing or rhonchi  ABDOMEN: Soft, non-tender, non-distended, No pulsatile mass, MUSCULOSKELETAL: No deformity  SKIN: Warm and dry NEUROLOGIC:  Alert and oriented x 3 PSYCHIATRIC:  Normal affect   ASSESSMENT:    1. Coronary artery disease involving coronary bypass graft of native heart with angina pectoris ([place])   2. Chronic systolic heart failure ([place])   3. LBBB (left bundle branch block)   4. Mixed hyperlipidemia   5. Paroxysmal atrial fibrillation ([facility])   6. Severe obstructive sleep apnea   7. Biventricular automatic implantable cardioverter defibrillator in situ   8. HYPERTENSION, BENIGN SYSTEMIC    PLAN:    In order of problems listed above:  He is doing very well on secondary prevention. Continue Entresto, Toprol-XL. Pacing this morning. Continue Praluent and Vascepa. Continue Eliquis.  Has low burden atrial fib. Continue CPAP compliance Resynchronization has significantly improved LV function. Excellent blood pressure control on current regimen.  Guideline directed therapy for left ventricular systolic dysfunction: Angiotensin receptor-neprilysin inhibitor (ARNI)-Entresto; beta-blocker therapy - carvedilol, metoprolol succinate, or bisoprolol; mineralocorticoid receptor antagonist (MRA) therapy -spironolactone or eplerenone.  SGLT-2 agents -  Dapagliflozin [place]) or Empagliflozin ([place]).These therapies have been shown to improve clinical outcomes including reduction of rehospitalization, survival, and acute heart failure.    Medication Adjustments/Labs and Tests Ordered: Current medicines are reviewed at length with the patient today.  Concerns regarding medicines are outlined above.  Orders Placed This Encounter   Procedures   EKG 12-Lead   No orders of the defined types were placed in this encounter.   There are no Patient Instructions on file for this visit.   Signed, Sinclair Grooms, MD  09/03/2020 8:10 AM    [place]

## 2020-09-03 ENCOUNTER — Ambulatory Visit (INDEPENDENT_AMBULATORY_CARE_PROVIDER_SITE_OTHER): Payer: Medicare Other | Admitting: Interventional Cardiology

## 2020-09-03 ENCOUNTER — Encounter: Payer: Self-pay | Admitting: Interventional Cardiology

## 2020-09-03 ENCOUNTER — Other Ambulatory Visit: Payer: Self-pay

## 2020-09-03 VITALS — BP 122/78 | HR 80 | Ht 72.0 in | Wt 197.0 lb

## 2020-09-03 DIAGNOSIS — E782 Mixed hyperlipidemia: Secondary | ICD-10-CM

## 2020-09-03 DIAGNOSIS — I447 Left bundle-branch block, unspecified: Secondary | ICD-10-CM | POA: Diagnosis not present

## 2020-09-03 DIAGNOSIS — I25709 Atherosclerosis of coronary artery bypass graft(s), unspecified, with unspecified angina pectoris: Secondary | ICD-10-CM

## 2020-09-03 DIAGNOSIS — G4733 Obstructive sleep apnea (adult) (pediatric): Secondary | ICD-10-CM

## 2020-09-03 DIAGNOSIS — I255 Ischemic cardiomyopathy: Secondary | ICD-10-CM

## 2020-09-03 DIAGNOSIS — I480 Paroxysmal atrial fibrillation: Secondary | ICD-10-CM

## 2020-09-03 DIAGNOSIS — I10 Essential (primary) hypertension: Secondary | ICD-10-CM

## 2020-09-03 DIAGNOSIS — Z95810 Presence of automatic (implantable) cardiac defibrillator: Secondary | ICD-10-CM

## 2020-09-03 DIAGNOSIS — I5022 Chronic systolic (congestive) heart failure: Secondary | ICD-10-CM

## 2020-09-03 NOTE — Patient Instructions (Signed)
Medication Instructions:  Your physician recommends that you continue on your current medications as directed. Please refer to the Current Medication list given to you today.  *If you need a refill on your cardiac medications before your next appointment, please call your pharmacy*   Lab Work: None If you have labs (blood work) drawn today and your tests are completely normal, you will receive your results only by: MyChart Message (if you have MyChart) OR A paper copy in the mail If you have any lab test that is abnormal or we need to change your treatment, we will call you to review the results.   Testing/Procedures: None   Follow-Up: At [facility], you and your health needs are our priority.  As part of our continuing mission to provide you with exceptional heart care, we have created designated Provider Care Teams.  These Care Teams include your primary Cardiologist (physician) and Advanced Practice Providers (APPs -  Physician Assistants and Nurse Practitioners) who all work together to provide you with the care you need, when you need it.  We recommend signing up for the patient portal called "MyChart".  Sign up information is provided on this After Visit Summary.  MyChart is used to connect with patients for Virtual Visits (Telemedicine).  Patients are able to view lab/test results, encounter notes, upcoming appointments, etc.  Non-urgent messages can be sent to your provider as well.   To learn more about what you can do with MyChart, go to [email or web address].    Your next appointment:   6 month(s)  The format for your next appointment:   In Person  Provider:   You may see Henry W Smith III, MD or one of the following Advanced Practice Providers on your designated Care Team:   Laura Ingold, NP   Other Instructions   

## 2020-10-19 ENCOUNTER — Ambulatory Visit (INDEPENDENT_AMBULATORY_CARE_PROVIDER_SITE_OTHER): Payer: Medicare Other | Admitting: Gastroenterology

## 2020-10-19 ENCOUNTER — Encounter: Payer: Self-pay | Admitting: Gastroenterology

## 2020-10-19 ENCOUNTER — Telehealth: Payer: Self-pay

## 2020-10-19 VITALS — BP 118/76 | HR 69 | Ht 72.0 in | Wt 199.0 lb

## 2020-10-19 DIAGNOSIS — I255 Ischemic cardiomyopathy: Secondary | ICD-10-CM

## 2020-10-19 DIAGNOSIS — Z800 Family history of malignant neoplasm of digestive organs: Secondary | ICD-10-CM

## 2020-10-19 DIAGNOSIS — Z1211 Encounter for screening for malignant neoplasm of colon: Secondary | ICD-10-CM | POA: Diagnosis not present

## 2020-10-19 MED ORDER — NA SULFATE-K SULFATE-MG SULF 17.5-3.13-1.6 GM/177ML PO SOLN: 1.0000 | ORAL | 0 refills | Status: DC

## 2020-10-19 NOTE — Progress Notes (Signed)
Review of pertinent gastrointestinal problems: 1. Father had colon cancer (was 21 at diagnosis):  Colonoscopy 07/2007 Ardis Hughs, was normal, recommended recall colonoscopy at 5 years.  Unsedated colonoscopy 2014, Dr. Ardis Hughs, found diverticulosis but was otherwise normal. Recommended repeat colonoscopy at five-year interval.  HPI: This is a very pleasant 70 year old man  I last saw him about 5-1/2 years ago, February 2017.  At that visit we discussed his reflux, regurgitation at night.  I recommended that he cut back on his NSAIDs which she was taking 2-3 times daily and a baby aspirin every day as well.  I increased his proton pump inhibitor to prescription strength, began an H2 blocker at bedtime and recommended that we proceed with EGD at his soonest convenience.  He never went through with that procedure.  Echocardiogram August 2022, left ventricular ejection fraction 50 to 55%.  Blood work August 2022 CBC was normal, complete metabolic profile was normal.  He saw his cardiologist less than 2 months ago who felt he was doing very well and secondary prevention.  He is status post coronary artery bypass grafting 2002 and also 2018.  He is on Eliquis for atrial fibrillation  He has had no troubles with his GI tract.  Specifically no constipation, no diarrhea, no overt GI bleeding.  His weight is overall stable.   Review of systems: Pertinent positive and negative review of systems were noted in the above HPI section. All other review negative.   Past Medical History:  Diagnosis Date   AICD (automatic cardioverter/defibrillator) present    BIV   Anginal pain ([place])    Blood transfusion without reported diagnosis    CAD (coronary artery disease)    with CABG LIMA to LAD, free radial PDA, SVG to diagonal, SVG to OM. 2002. DES May 2011 and 01-2010 to SVG of Diag, native OM, and Native RCA. Unable to stent LAD via LIMA10/18 PCI to dRCA, multi-site DES to SVG--> OM   GERD (gastroesophageal reflux  disease)    Hyperlipidemia    unable to tolarate statin therapy   Hypertension    Kidney stone    OSA on CPAP 12/13/2017   Severe obstructive sleep apnea with an AHI of 30.3/h and no significant central sleep apnea.  His oxygen saturations dropped to 83%.  He is on CPAP at 8 cm H2O.   Vertigo     Past Surgical History:  Procedure Laterality Date   BIV ICD INSERTION CRT-D N/A 07/11/2016   Procedure: BiV ICD Insertion CRT-D;  Surgeon: Evans Lance, MD;  Location: [place] [facility];  Service: Cardiovascular;  Laterality: N/A;   CHOLECYSTECTOMY     COLONOSCOPY     CORONARY ARTERY BYPASS GRAFT     CORONARY BALLOON ANGIOPLASTY N/A 10/12/2016   Procedure: CORONARY BALLOON ANGIOPLASTY;  Surgeon: Belva Crome, MD;  Location: [place] [facility];  Service: Cardiovascular;  Laterality: N/A;   CORONARY STENT INTERVENTION  10/12/2016   PTCA of focal in-stent restenosis in the second obtuse marginal reducing 90% stenosis to 0%.   CORONARY STENT INTERVENTION N/A 10/12/2016   Procedure: CORONARY STENT INTERVENTION;  Surgeon: Belva Crome, MD;  Location: [place] [facility];  Service: Cardiovascular;  Laterality: N/A;   LEFT HEART CATH AND CORS/GRAFTS ANGIOGRAPHY N/A 10/12/2016   Procedure: LEFT HEART CATH AND CORS/GRAFTS ANGIOGRAPHY;  Surgeon: Belva Crome, MD;  Location: [place] [facility];  Service: Cardiovascular;  Laterality: N/A;   LEFT HEART CATH AND CORS/GRAFTS ANGIOGRAPHY N/A 01/08/2018  Procedure: LEFT HEART CATH AND CORS/GRAFTS ANGIOGRAPHY;  Surgeon: Belva Crome, MD;  Location: [place] [facility];  Service: Cardiovascular;  Laterality: N/A;   RIGHT/LEFT HEART CATH AND CORONARY/GRAFT ANGIOGRAPHY N/A 03/01/2016   Procedure: Right/Left Heart Cath and Coronary/Graft Angiography;  Surgeon: Belva Crome, MD;  Location: [place] [facility];  Service: Cardiovascular;  Laterality: N/A;   ULTRASOUND GUIDANCE FOR VASCULAR ACCESS  01/08/2018   Procedure: Ultrasound Guidance For Vascular Access;   Surgeon: Belva Crome, MD;  Location: [place] [facility];  Service: Cardiovascular;;    Current Outpatient Medications  Medication Sig Dispense Refill   Alirocumab (PRALUENT) 75 MG/ML SOAJ Inject 1 pen into the skin every 14 (fourteen) days. 6 mL 3   cholecalciferol (VITAMIN D3) 25 MCG (1000 UT) tablet Take 1,000 Units by mouth daily.     ELIQUIS 5 MG TABS tablet TAKE 1 TABLET BY MOUTH TWICE A DAY 60 tablet 5   finasteride (PROPECIA) 1 MG tablet Take 1 mg by mouth daily.     icosapent Ethyl (VASCEPA) 1 g capsule Take 2 capsules (2 g total) by mouth 2 (two) times daily. 360 capsule 2   metoprolol succinate (TOPROL-XL) 25 MG 24 hr tablet TAKE 1 TABLET BY MOUTH DAILY. TAKE WITH OR IMMEDIATELY FOLLOWING A MEAL. 90 tablet 2   nitroGLYCERIN (NITROSTAT) 0.4 MG SL tablet Place 1 tablet (0.4 mg total) under the tongue every 5 (five) minutes as needed for chest pain (MAX 3 TABLETS). 25 tablet 3   omeprazole (PRILOSEC) 40 MG capsule TAKE 1 CAPSULE BY MOUTH EVERY DAY 90 capsule 2   sacubitril-valsartan (ENTRESTO) 49-51 MG Take 1 tablet by mouth 2 (two) times daily. 180 tablet 2   sodium chloride (MURO 128) 2 % ophthalmic solution Place 1 drop into the left eye 3 (three) times daily.     tadalafil (CIALIS) 20 MG tablet Take by mouth as needed.     niacinamide 100 MG tablet Take by mouth daily.     No current facility-administered medications for this visit.    Allergies as of 10/19/2020 - Review Complete 10/19/2020  Allergen Reaction Noted   Fish oil Other (See Comments) 07/03/2013    Family History  Problem Relation Age of Onset   Colon cancer Father    Sudden death Neg Hx    Hypertension Neg Hx    Hyperlipidemia Neg Hx    Heart attack Neg Hx    Diabetes Neg Hx    Rectal cancer Neg Hx    Stomach cancer Neg Hx     Social History   Socioeconomic History   Marital status: Married    Spouse name: Not on file   Number of children: Not on file   Years of education: Not on file   Highest  education level: Not on file  Occupational History   Not on file  Tobacco Use   Smoking status: Never   Smokeless tobacco: Never  Vaping Use   Vaping Use: Never used  Substance and Sexual Activity   Alcohol use: Yes    Alcohol/week: 1.0 standard drink    Types: 1 Glasses of wine per week    Comment: RARE   Drug use: No   Sexual activity: Not on file  Other Topics Concern   Not on file  Social History Narrative   Not on file   Social Determinants of Health   Financial Resource Strain: Not on file  Food Insecurity: Not on file  Transportation Needs: Not  on file  Physical Activity: Not on file  Stress: Not on file  Social Connections: Not on file  Intimate Partner Violence: Not on file     Physical Exam: BP 118/76   Pulse 69   Ht 6' (1.829 m)   Wt 199 lb (90.3 kg)   BMI 26.99 kg/m  Constitutional: generally well-appearing Psychiatric: alert and oriented x3 Eyes: extraocular movements intact Mouth: oral pharynx moist, no lesions Neck: supple no lymphadenopathy Cardiovascular: heart regular rate and rhythm Lungs: clear to auscultation bilaterally Abdomen: soft, nontender, nondistended, no obvious ascites, no peritoneal signs, normal bowel sounds Extremities: no lower extremity edema bilaterally Skin: no lesions on visible extremities   Assessment and plan: 70 y.o. male with family history of colon cancer, his father had colon cancer.  He is at increased risk for procedure related complications given his ongoing blood thinner use.  I recommended that we proceed with colonoscopy at his soonest convenience.  He understands that he is at increased risk for bleeding complications during the procedure given his blood thinner use and so I recommended that he hold the medicine for 2 days prior to the colonoscopy.  We will reach out to his cardiologist to make sure they agree with that recommendation.  As usual he is asking that we attempt this unsedated and I am happy to do  that for him.  He understands that he will have a policy in our endoscopy center that he have IV access throughout the procedure just in case it is needed even though we do not plan on using it for sedation.  Please see the "Patient Instructions" section for addition details about the plan.   Owens Loffler, MD [facility] 10/19/2020, 3:30 PM  Cc: No ref. provider found  Total time on date of encounter was 40 minutes (this included time spent preparing to see the patient reviewing records; obtaining and/or reviewing separately obtained history; performing a medically appropriate exam and/or evaluation; counseling and educating the patient and family if present; ordering medications, tests or procedures if applicable; and documenting clinical information in the health record).

## 2020-10-19 NOTE — Telephone Encounter (Signed)
Clinical pharmacist to review Eliquis 

## 2020-10-19 NOTE — Patient Instructions (Addendum)
If you are age 70 or older, your body mass index should be between 23-30. Your Body mass index is 26.99 kg/m. If this is out of the aforementioned range listed, please consider follow up with your Primary Care Provider. __________________________________________________________  The [place] [facility] providers would like to encourage you to use [facility] to communicate with providers for non-urgent requests or questions.  Due to long hold times on the telephone, sending your provider a message by [facility] may be a faster and more efficient way to get a response.  Please allow 48 business hours for a response.  Please remember that this is for non-urgent requests.   You have been scheduled for a colonoscopy. Please follow written instructions given to you at your visit today.  Please pick up your prep supplies at the pharmacy within the next 1-3 days. If you use inhalers (even only as needed), please bring them with you on the day of your procedure.  Due to recent changes in healthcare laws, you may see the results of your imaging and laboratory studies on MyChart before your provider has had a chance to review them.  We understand that in some cases there may be results that are confusing or concerning to you. Not all laboratory results come back in the same time frame and the provider may be waiting for multiple results in ord 0er to interpret others.  Please give [place] 48 hours in order for your provider to thoroughly review all the results before contacting the office for clarification of your results.   You will be contacted by our office prior to your procedure for directions on holding your Eliquis.  If you do not hear from our office 1 week prior to your scheduled procedure, please call [phone] to discuss.   Thank you for entrusting me with your care and choosing [facility].  Dr Ardis Hughs

## 2020-10-19 NOTE — Telephone Encounter (Signed)
[facility] Pre-operative Risk Assessment     Request for surgical clearance:     Endoscopy Procedure  What type of surgery is being performed?     Colonoscopy  When is this surgery scheduled?     12-13-20  What type of clearance is required ?   Pharmacy  Are there any medications that need to be held prior to surgery and how long? Eliquis x 2 days  Practice name and name of physician performing surgery?      [facility]  What is your office phone and fax number?      Phone- [phone]  [phone]  Anesthesia type (None, local, MAC, general) ?       MAC

## 2020-10-20 NOTE — Telephone Encounter (Signed)
Please inform the patient to hold Eliquis for 2 days prior to the procedure and restart as soon as possible afterward at the GI doctor's discretion.

## 2020-10-20 NOTE — Telephone Encounter (Signed)
Pt has been made aware of hold time for Eliquis x 2 days prior to procedure in 12/2020. Pt aware will resume Eliquis once GI doctor feels it is safe to resume. Pt thanked me for the call and the help.

## 2020-10-20 NOTE — Telephone Encounter (Signed)
Patient with diagnosis of AFib on Eliquis for anticoagulation.    Procedure: colonoscopy Date of procedure: 12/13/2020   CHA2DS2-VASc Score = 4   This indicates a 4.8% annual risk of stroke. The patient's score is based upon: CHF History: 1 HTN History: 1 Diabetes History: 0 Stroke History: 0 Vascular Disease History: 1 Age Score: 1 Gender Score: 0     CrCl 84 mL/min Platelet count 285K  Per office protocol, patient can hold Eliquis for 2 days prior to procedure.

## 2020-10-21 ENCOUNTER — Ambulatory Visit (INDEPENDENT_AMBULATORY_CARE_PROVIDER_SITE_OTHER): Payer: Medicare Other

## 2020-10-21 DIAGNOSIS — I255 Ischemic cardiomyopathy: Secondary | ICD-10-CM

## 2020-10-21 NOTE — Telephone Encounter (Signed)
Patient advised that he has been given clearance to hold Eliquis 2 days prior to colonoscopy scheduled for 12-13-20.  Patient advised to take last dose of Eliqus on 12-10-20, and he will be advised when to restart Eliquis by Dr Ardis Hughs after the procedure.  Patient agreed to plan and verbalized understanding.  No further questions.

## 2020-10-22 LAB — CUP PACEART REMOTE DEVICE CHECK
Battery Remaining Longevity: 114 mo
Battery Remaining Percentage: 100 %
Brady Statistic RA Percent Paced: 2 %
Brady Statistic RV Percent Paced: 9 %
Date Time Interrogation Session: 20221013044100
HighPow Impedance: 76 Ohm
Implantable Lead Implant Date: 20180703
Implantable Lead Implant Date: 20180703
Implantable Lead Implant Date: 20180703
Implantable Lead Location: 753858
Implantable Lead Location: 753859
Implantable Lead Location: 753860
Implantable Lead Model: 293
Implantable Lead Model: 4674
Implantable Lead Model: 7741
Implantable Lead Serial Number: 433305
Implantable Lead Serial Number: 801469
Implantable Lead Serial Number: 901025
Implantable Pulse Generator Implant Date: 20180703
Lead Channel Impedance Value: 452 Ohm
Lead Channel Impedance Value: 592 Ohm
Lead Channel Impedance Value: 630 Ohm
Lead Channel Setting Pacing Amplitude: 1.5 V
Lead Channel Setting Pacing Amplitude: 2 V
Lead Channel Setting Pacing Amplitude: 2 V
Lead Channel Setting Pacing Pulse Width: 0.4 ms
Lead Channel Setting Pacing Pulse Width: 0.4 ms
Lead Channel Setting Sensing Sensitivity: 0.6 mV
Lead Channel Setting Sensing Sensitivity: 1 mV
Pulse Gen Serial Number: 169965

## 2020-10-29 NOTE — Progress Notes (Signed)
Remote ICD transmission.   

## 2020-11-18 ENCOUNTER — Other Ambulatory Visit: Payer: Self-pay | Admitting: Interventional Cardiology

## 2020-11-22 ENCOUNTER — Other Ambulatory Visit: Payer: Self-pay | Admitting: Interventional Cardiology

## 2020-11-22 DIAGNOSIS — I480 Paroxysmal atrial fibrillation: Secondary | ICD-10-CM

## 2020-11-22 NOTE — Telephone Encounter (Signed)
Prescription refill request for Eliquis received. Indication: Afib  Last office visit:09/03/20 Tamala Julian)  Scr: 0.96 (08/24/20)  Age: 70 Weight: 90.3kg   Appropriate dose and refill sent to requested pharmacy.

## 2020-12-07 ENCOUNTER — Telehealth: Payer: Self-pay | Admitting: Gastroenterology

## 2020-12-07 MED ORDER — NA SULFATE-K SULFATE-MG SULF 17.5-3.13-1.6 GM/177ML PO SOLN
1.0000 | ORAL | 0 refills | Status: DC
Start: 2020-12-07 — End: 2022-02-19

## 2020-12-07 NOTE — Telephone Encounter (Signed)
Inbound call from patient, states he was waiting for a call from you. States he is wanting to discus with you about his Eliguis. Please call back to advise.

## 2020-12-07 NOTE — Telephone Encounter (Signed)
Patient advised that he has been given clearance to hold Eliquis 2 days prior to colonoscopy scheduled for 12-13-20.  Patient advised to take last dose of Eliqus on 12-10-20, and he will be advised when to restart Eliquis by Dr Ardis Hughs after the procedure.  Patient agreed to plan and verbalized understanding.    Patient asked for another Rx for Suprep to be sent to pharmacy. Rx sent as requested.

## 2020-12-08 ENCOUNTER — Other Ambulatory Visit: Payer: Self-pay | Admitting: Interventional Cardiology

## 2020-12-12 ENCOUNTER — Other Ambulatory Visit: Payer: Self-pay | Admitting: Interventional Cardiology

## 2020-12-13 ENCOUNTER — Other Ambulatory Visit: Payer: Self-pay

## 2020-12-13 ENCOUNTER — Ambulatory Visit (AMBULATORY_SURGERY_CENTER): Payer: Medicare Other | Admitting: Gastroenterology

## 2020-12-13 ENCOUNTER — Encounter: Payer: Self-pay | Admitting: Gastroenterology

## 2020-12-13 VITALS — BP 167/75 | HR 75 | Temp 98.0°F | Resp 16 | Ht 72.0 in | Wt 199.0 lb

## 2020-12-13 DIAGNOSIS — Z800 Family history of malignant neoplasm of digestive organs: Secondary | ICD-10-CM

## 2020-12-13 DIAGNOSIS — D122 Benign neoplasm of ascending colon: Secondary | ICD-10-CM

## 2020-12-13 DIAGNOSIS — Z1211 Encounter for screening for malignant neoplasm of colon: Secondary | ICD-10-CM | POA: Diagnosis not present

## 2020-12-13 MED ORDER — SODIUM CHLORIDE 0.9 % IV SOLN: 500.0000 mL | Freq: Once | INTRAVENOUS | Status: DC

## 2020-12-13 NOTE — Progress Notes (Signed)
PT taken to PACU. Monitors in place. VSS. Report given to RN. 

## 2020-12-13 NOTE — Patient Instructions (Signed)
2 polyps removed- await pathology  Continue your normal medications- including Eliquis tonight      YOU HAD AN ENDOSCOPIC PROCEDURE TODAY AT [facility]:   Refer to the procedure report that was given to you for any specific questions about what was found during the examination.  If the procedure report does not answer your questions, please call your gastroenterologist to clarify.  If you requested that your care partner not be given the details of your procedure findings, then the procedure report has been included in a sealed envelope for you to review at your convenience later.  YOU SHOULD EXPECT: Some feelings of bloating in the abdomen. Passage of more gas than usual.  Walking can help get rid of the air that was put into your GI tract during the procedure and reduce the bloating. If you had a lower endoscopy (such as a colonoscopy or flexible sigmoidoscopy) you may notice spotting of blood in your stool or on the toilet paper. If you underwent a bowel prep for your procedure, you may not have a normal bowel movement for a few days.  Please Note:  You might notice some irritation and congestion in your nose or some drainage.  This is from the oxygen used during your procedure.  There is no need for concern and it should clear up in a day or so.  SYMPTOMS TO REPORT IMMEDIATELY:  Following lower endoscopy (colonoscopy or flexible sigmoidoscopy):  Excessive amounts of blood in the stool  Significant tenderness or worsening of abdominal pains  Swelling of the abdomen that is new, acute  Fever of 100F or higher  For urgent or emergent issues, a gastroenterologist can be reached at any hour by calling [phone]. Do not use MyChart messaging for urgent concerns.    DIET:  We do recommend a small meal at first, but then you may proceed to your regular diet.  Drink plenty of fluids but you should avoid alcoholic beverages for 24 hours.  ACTIVITY:  You should plan to  take it easy for the rest of today and you should NOT DRIVE or use heavy machinery until tomorrow (because of the sedation medicines used during the test).    FOLLOW UP: Our staff will call the number listed on your records 48-72 hours following your procedure to check on you and address any questions or concerns that you may have regarding the information given to you following your procedure. If we do not reach you, we will leave a message.  We will attempt to reach you two times.  During this call, we will ask if you have developed any symptoms of COVID 19. If you develop any symptoms (ie: fever, flu-like symptoms, shortness of breath, cough etc.) before then, please call [phone].  If you test positive for Covid 19 in the 2 weeks post procedure, please call and report this information to [place].    If any biopsies were taken you will be contacted by phone or by letter within the next 1-3 weeks.  Please call us at [phone] if you have not heard about the biopsies in 3 weeks.    SIGNATURES/CONFIDENTIALITY: You and/or your care partner have signed paperwork which will be entered into your electronic medical record.  These signatures attest to the fact that that the information above on your After Visit Summary has been reviewed and is understood.  Full responsibility of the confidentiality of this discharge information lies with you and/or your care-partner.

## 2020-12-13 NOTE — Op Note (Addendum)
[facility] Patient Name: Tommy [name] Procedure Date: 12/13/2020 10:31 AM MRN: [id number] Endoscopist: Milus Banister , MD Age: 70 Referring MD:  Date of Birth: 1950-04-19 Gender: Male Account #: [id number] Procedure:                Colonoscopy Indications:              Screening in patient at increased risk: Family                            history of 1st-degree relative with colorectal                            cancer; Father had colon cancer (was 40 at                            diagnosis):??Unsedated colonoscopy 07/2007 Ardis Hughs,                            was normal, recommended recall colonoscopy at 5                            years. ?Unsedated colonoscopy 2014, Dr. Ardis Hughs,                            found diverticulosis but was otherwise normal. Medicines:                Monitored Anesthesia Care Procedure:                Pre-Anesthesia Assessment:                           - Prior to the procedure, a History and Physical                            was performed, and patient medications and                            allergies were reviewed. The patient's tolerance of                            previous anesthesia was also reviewed. The risks                            and benefits of the procedure and the sedation                            options and risks were discussed with the patient.                            All questions were answered, and informed consent                            was obtained. Prior Anticoagulants: The patient has  taken Eliquis (apixaban), last dose was 2 days                            prior to procedure. ASA Grade Assessment: II - A                            patient with mild systemic disease. After reviewing                            the risks and benefits, the patient was deemed in                            satisfactory condition to undergo the procedure.                           After obtaining  informed consent, the colonoscope                            was passed under direct vision. Throughout the                            procedure, the patient's blood pressure, pulse, and                            oxygen saturations were monitored continuously. The                            Olympus CF-HQ190L [id number]) Colonoscope was                            introduced through the anus and advanced to the the                            cecum, identified by appendiceal orifice and                            ileocecal valve. The colonoscopy was performed                            without difficulty. The patient tolerated the                            procedure well. The quality of the bowel                            preparation was good. The ileocecal valve,                            appendiceal orifice, and rectum were photographed. Scope In: 10:34:22 AM Scope Out: 39:76:73 AM Scope Withdrawal Time: 0 hours 10 minutes 4 seconds  Total Procedure Duration: 0 hours 11 minutes 55 seconds  Findings:                 Two sessile polyps were found in the  ascending                            colon. The polyps were 2 to 3 mm in size. These                            polyps were removed with a cold snare. Resection                            was complete, but the polyp tissue was only                            partially retrieved.                           Multiple small and large-mouthed diverticula were                            found in the left colon.                           The exam was otherwise without abnormality on                            direct and retroflexion views. Complications:            No immediate complications. Estimated blood loss:                            None. Estimated Blood Loss:     Estimated blood loss: none. Impression:               - Two 2 to 3 mm polyps in the ascending colon,                            removed with a cold snare. Complete resection.                             Partial retrieval.                           - Diverticulosis in the left colon.                           - The examination was otherwise normal on direct                            and retroflexion views. Recommendation:           - Patient has a contact number available for                            emergencies. The signs and symptoms of potential                            delayed complications were discussed with the  patient. Return to normal activities tomorrow.                            Written discharge instructions were provided to the                            patient.                           - Resume previous diet.                           - Continue present medications. You can resume your                            blood thinner tonight.                           - Await pathology results. Milus Banister, MD 12/13/2020 10:49:11 AM This report has been signed electronically.

## 2020-12-13 NOTE — Progress Notes (Signed)
Father had colon cancer (was 32 at diagnosis):  Unsedated colonoscopy 07/2007 Ardis Hughs, was normal, recommended recall colonoscopy at 5 years.  Unsedated colonoscopy 2014, Dr. Ardis Hughs, found diverticulosis but was otherwise normal. Recommended repeat colonoscopy at five-year interval.  HPI: This is a man with FH CRC   ROS: complete GI ROS as described in HPI, all other review negative.  Constitutional:  No unintentional weight loss   Past Medical History:  Diagnosis Date   AICD (automatic cardioverter/defibrillator) present    BIV   Anginal pain ([place])    Blood transfusion without reported diagnosis    CAD (coronary artery disease)    with CABG LIMA to LAD, free radial PDA, SVG to diagonal, SVG to OM. 2002. DES May 2011 and 01-2010 to SVG of Diag, native OM, and Native RCA. Unable to stent LAD via LIMA10/18 PCI to dRCA, multi-site DES to SVG--> OM   GERD (gastroesophageal reflux disease)    Hyperlipidemia    unable to tolarate statin therapy   Hypertension    Kidney stone    OSA on CPAP 12/13/2017   Severe obstructive sleep apnea with an AHI of 30.3/h and no significant central sleep apnea.  His oxygen saturations dropped to 83%.  He is on CPAP at 8 cm H2O.   Vertigo     Past Surgical History:  Procedure Laterality Date   BIV ICD INSERTION CRT-D N/A 07/11/2016   Procedure: BiV ICD Insertion CRT-D;  Surgeon: Evans Lance, MD;  Location: [facility];  Service: Cardiovascular;  Laterality: N/A;   CHOLECYSTECTOMY     COLONOSCOPY     CORONARY ARTERY BYPASS GRAFT     CORONARY BALLOON ANGIOPLASTY N/A 10/12/2016   Procedure: CORONARY BALLOON ANGIOPLASTY;  Surgeon: Belva Crome, MD;  Location: [facility];  Service: Cardiovascular;  Laterality: N/A;   CORONARY STENT INTERVENTION  10/12/2016   PTCA of focal in-stent restenosis in the second obtuse marginal reducing 90% stenosis to 0%.   CORONARY STENT INTERVENTION N/A 10/12/2016   Procedure: CORONARY STENT INTERVENTION;   Surgeon: Belva Crome, MD;  Location: [facility];  Service: Cardiovascular;  Laterality: N/A;   LEFT HEART CATH AND CORS/GRAFTS ANGIOGRAPHY N/A 10/12/2016   Procedure: LEFT HEART CATH AND CORS/GRAFTS ANGIOGRAPHY;  Surgeon: Belva Crome, MD;  Location: [facility];  Service: Cardiovascular;  Laterality: N/A;   LEFT HEART CATH AND CORS/GRAFTS ANGIOGRAPHY N/A 01/08/2018   Procedure: LEFT HEART CATH AND CORS/GRAFTS ANGIOGRAPHY;  Surgeon: Belva Crome, MD;  Location: [facility];  Service: Cardiovascular;  Laterality: N/A;   RIGHT/LEFT HEART CATH AND CORONARY/GRAFT ANGIOGRAPHY N/A 03/01/2016   Procedure: Right/Left Heart Cath and Coronary/Graft Angiography;  Surgeon: Belva Crome, MD;  Location: [facility];  Service: Cardiovascular;  Laterality: N/A;   ULTRASOUND GUIDANCE FOR VASCULAR ACCESS  01/08/2018   Procedure: Ultrasound Guidance For Vascular Access;  Surgeon: Belva Crome, MD;  Location: [facility];  Service: Cardiovascular;;    Current Outpatient Medications  Medication Sig Dispense Refill   Alirocumab (PRALUENT) 75 MG/ML SOAJ Inject 1 pen into the skin every 14 (fourteen) days. 6 mL 3   cholecalciferol (VITAMIN D3) 25 MCG (1000 UT) tablet Take 1,000 Units by mouth daily.     ELIQUIS 5 MG TABS tablet TAKE 1 TABLET BY MOUTH TWICE A DAY 60 tablet 5   finasteride (PROPECIA) 1 MG tablet Take 1 mg by mouth daily.     icosapent Ethyl (  VASCEPA) 1 g capsule Take 2 capsules (2 g total) by mouth 2 (two) times daily. 360 capsule 2   metoprolol succinate (TOPROL-XL) 25 MG 24 hr tablet TAKE 1 TABLET BY MOUTH DAILY. TAKE WITH OR IMMEDIATELY FOLLOWING A MEAL. 90 tablet 2   Na Sulfate-K Sulfate-Mg Sulf (SUPREP BOWEL PREP KIT) 17.5-3.13-1.6 GM/177ML SOLN Take 1 kit by mouth as directed. 324 mL 0   niacinamide 100 MG tablet Take by mouth daily.     nitroGLYCERIN (NITROSTAT) 0.4 MG SL tablet Place 1 tablet (0.4 mg total) under the tongue every 5 (five) minutes as  needed for chest pain (MAX 3 TABLETS). 25 tablet 3   omeprazole (PRILOSEC) 40 MG capsule TAKE 1 CAPSULE BY MOUTH EVERY DAY 90 capsule 2   sacubitril-valsartan (ENTRESTO) 49-51 MG Take 1 tablet by mouth 2 (two) times daily. 180 tablet 2   sodium chloride (MURO 128) 2 % ophthalmic solution Place 1 drop into the left eye 3 (three) times daily.     tadalafil (CIALIS) 20 MG tablet Take by mouth as needed.     Current Facility-Administered Medications  Medication Dose Route Frequency Provider Last Rate Last Admin   0.9 %  sodium chloride infusion  500 mL Intravenous Once Milus Banister, MD        Allergies as of 12/13/2020 - Review Complete 12/13/2020  Allergen Reaction Noted   Fish oil Other (See Comments) 07/03/2013    Family History  Problem Relation Age of Onset   Colon cancer Father    Sudden death Neg Hx    Hypertension Neg Hx    Hyperlipidemia Neg Hx    Heart attack Neg Hx    Diabetes Neg Hx    Rectal cancer Neg Hx    Stomach cancer Neg Hx     Social History   Socioeconomic History   Marital status: Married    Spouse name: Not on file   Number of children: Not on file   Years of education: Not on file   Highest education level: Not on file  Occupational History   Not on file  Tobacco Use   Smoking status: Never   Smokeless tobacco: Never  Vaping Use   Vaping Use: Never used  Substance and Sexual Activity   Alcohol use: Yes    Alcohol/week: 1.0 standard drink    Types: 1 Glasses of wine per week    Comment: RARE   Drug use: No   Sexual activity: Not on file  Other Topics Concern   Not on file  Social History Narrative   Not on file   Social Determinants of Health   Financial Resource Strain: Not on file  Food Insecurity: Not on file  Transportation Needs: Not on file  Physical Activity: Not on file  Stress: Not on file  Social Connections: Not on file  Intimate Partner Violence: Not on file     Physical Exam: BP (!) 147/85   Pulse 70   Temp 98  F (36.7 C)   Ht 6' (1.829 m)   Wt 199 lb (90.3 kg)   SpO2 100%   BMI 26.99 kg/m  Constitutional: generally well-appearing Psychiatric: alert and oriented x3 Lungs: CTA bilaterally Heart: no MCR  Assessment and plan: 70 y.o. male with FH CRC  Colonsocopy  today, off eliquis 2 days  Care is appropriate for the ambulatory setting.  Owens Loffler, MD [facility] 12/13/2020, 10:20 AM

## 2020-12-13 NOTE — Progress Notes (Signed)
Called to room to assist during endoscopic procedure.  Patient ID and intended procedure confirmed with present staff. Received instructions for my participation in the procedure from the performing physician.  

## 2020-12-15 ENCOUNTER — Telehealth: Payer: Self-pay

## 2020-12-15 NOTE — Telephone Encounter (Signed)
  Follow up Call-  Call back number 12/13/2020  Post procedure Call Back phone  # [phone]  Permission to leave phone message Yes  Some recent data might be hidden     Patient questions:  Do you have a fever, pain , or abdominal swelling? No. Pain Score  0 *  Have you tolerated food without any problems? Yes.    Have you been able to return to your normal activities? Yes.    Do you have any questions about your discharge instructions: Diet   No. Medications  No. Follow up visit  No.  Do you have questions or concerns about your Care? No.  Actions: * If pain score is 4 or above: No action needed, pain <4.  Have you developed a fever since your procedure? No  2.   Have you had an respiratory symptoms (SOB or cough) since your procedure? no  3.   Have you tested positive for COVID 19 since your procedure no  4.   Have you had any family members/close contacts diagnosed with the COVID 19 since your procedure?  no   If yes to any of these questions please route to Joylene John, RN and Joella Prince, RN

## 2020-12-16 ENCOUNTER — Encounter: Payer: Self-pay | Admitting: Gastroenterology

## 2021-01-20 ENCOUNTER — Ambulatory Visit (INDEPENDENT_AMBULATORY_CARE_PROVIDER_SITE_OTHER): Payer: Medicare Other

## 2021-01-20 DIAGNOSIS — I255 Ischemic cardiomyopathy: Secondary | ICD-10-CM

## 2021-01-21 LAB — CUP PACEART REMOTE DEVICE CHECK
Battery Remaining Longevity: 108 mo
Battery Remaining Percentage: 100 %
Brady Statistic RA Percent Paced: 2 %
Brady Statistic RV Percent Paced: 9 %
Date Time Interrogation Session: 20230113044200
HighPow Impedance: 77 Ohm
Implantable Lead Implant Date: 20180703
Implantable Lead Implant Date: 20180703
Implantable Lead Implant Date: 20180703
Implantable Lead Location: 753858
Implantable Lead Location: 753859
Implantable Lead Location: 753860
Implantable Lead Model: 293
Implantable Lead Model: 4674
Implantable Lead Model: 7741
Implantable Lead Serial Number: 433305
Implantable Lead Serial Number: 801469
Implantable Lead Serial Number: 901025
Implantable Pulse Generator Implant Date: 20180703
Lead Channel Impedance Value: 408 Ohm
Lead Channel Impedance Value: 570 Ohm
Lead Channel Impedance Value: 591 Ohm
Lead Channel Setting Pacing Amplitude: 1.5 V
Lead Channel Setting Pacing Amplitude: 2 V
Lead Channel Setting Pacing Amplitude: 2 V
Lead Channel Setting Pacing Pulse Width: 0.4 ms
Lead Channel Setting Pacing Pulse Width: 0.4 ms
Lead Channel Setting Sensing Sensitivity: 0.6 mV
Lead Channel Setting Sensing Sensitivity: 1 mV
Pulse Gen Serial Number: 169965

## 2021-02-01 NOTE — Progress Notes (Signed)
Remote ICD transmission.   

## 2021-03-03 NOTE — Progress Notes (Signed)
Cardiology Office Note:    Date:  03/04/2021   ID:  [name], DOB 04/28/1950, MRN [id number]  PCP:  Patient, No Pcp Per (Inactive)  Cardiologist:  Sinclair Grooms, MD   Referring MD: No ref. provider found   Chief Complaint  Patient presents with   Coronary Artery Disease   Hypertension   Hyperlipidemia    History of Present Illness:    Tommy [name] is a 71 y.o. male with a hx of  ischemic cardiomyopathy, exertional angina pectoris, prior coronary bypass grafting 2002 with bypass graft failure and multiple native and bypass graft stents most recently October 2018, CRT-D 2018, hypertension, hyperlipidemia and chronic systolic heart failure, AICD, brief atrial fibrillation on telemetry, and OSA.   He is doing well.  Here for 78-monthfollow-up.  No interval issues.  Showed me pictures of his 2 not identical twins.  He has 3 other grandchildren.  He is physically active.  He is concerned about weight gain.  We discussed diet.  We discussed eliminating carbohydrates in the diet.  Past Medical History:  Diagnosis Date   AICD (automatic cardioverter/defibrillator) present    BIV   Anginal pain ([place]    Blood transfusion without reported diagnosis    CAD (coronary artery disease)    with CABG LIMA to LAD, free radial PDA, SVG to diagonal, SVG to OM. 2002. DES May 2011 and 01-2010 to SVG of Diag, native OM, and Native RCA. Unable to stent LAD via LIMA10/18 PCI to dRCA, multi-site DES to SVG--> OM   GERD (gastroesophageal reflux disease)    Hyperlipidemia    unable to tolarate statin therapy   Hypertension    Kidney stone    OSA on CPAP 12/13/2017   Severe obstructive sleep apnea with an AHI of 30.3/h and no significant central sleep apnea.  His oxygen saturations dropped to 83%.  He is on CPAP at 8 cm H2O.   Sleep apnea    Vertigo     Past Surgical History:  Procedure Laterality Date   BIV ICD INSERTION CRT-D N/A 07/11/2016   Procedure: BiV ICD Insertion CRT-D;  Surgeon:  TEvans Lance MD;  [facility];  Service: Cardiovascular;  Laterality: N/A;   CHOLECYSTECTOMY     COLONOSCOPY     CORONARY ARTERY BYPASS GRAFT     CORONARY BALLOON ANGIOPLASTY N/A 10/12/2016   Procedure: CORONARY BALLOON ANGIOPLASTY;  Surgeon: SBelva Crome MD;  [facility];  Service: Cardiovascular;  Laterality: N/A;   CORONARY STENT INTERVENTION  10/12/2016   PTCA of focal in-stent restenosis in the second obtuse marginal reducing 90% stenosis to 0%.   CORONARY STENT INTERVENTION N/A 10/12/2016   Procedure: CORONARY STENT INTERVENTION;  Surgeon: SBelva Crome MD;  [facility];  Service: Cardiovascular;  Laterality: N/A;   LEFT HEART CATH AND CORS/GRAFTS ANGIOGRAPHY N/A 10/12/2016   Procedure: LEFT HEART CATH AND CORS/GRAFTS ANGIOGRAPHY;  Surgeon: SBelva Crome MD;  [facility];  Service: Cardiovascular;  Laterality: N/A;   LEFT HEART CATH AND CORS/GRAFTS ANGIOGRAPHY N/A 01/08/2018   Procedure: LEFT HEART CATH AND CORS/GRAFTS ANGIOGRAPHY;  Surgeon: SBelva Crome MD;  [facility];  Service: Cardiovascular;  Laterality: N/A;   RIGHT/LEFT HEART CATH AND CORONARY/GRAFT ANGIOGRAPHY N/A 03/01/2016   Procedure: Right/Left Heart Cath and Coronary/Graft Angiography;  Surgeon: HBelva Crome MD;  [facility];  Service: Cardiovascular;  Laterality: N/A;  ULTRASOUND GUIDANCE FOR VASCULAR ACCESS  01/08/2018   Procedure: Ultrasound Guidance For Vascular Access;  Surgeon: Belva Crome, MD;  [facility];  Service: Cardiovascular;;    Current Medications: Current Meds  Medication Sig   Alirocumab (PRALUENT) 75 MG/ML SOAJ INJECT 1 PEN INTO THE SKIN EVERY 14 (FOURTEEN) DAYS.   cholecalciferol (VITAMIN D3) 25 MCG (1000 UT) tablet Take 1,000 Units by mouth daily.   ELIQUIS 5 MG TABS tablet TAKE 1 TABLET BY MOUTH TWICE A DAY   ergocalciferol (VITAMIN D2) 1.25 MG (50000 UT) capsule  Take 1 capsule by mouth daily.   finasteride (PROPECIA) 1 MG tablet Take 1 mg by mouth daily.   icosapent Ethyl (VASCEPA) 1 g capsule Take 2 capsules (2 g total) by mouth 2 (two) times daily.   metoprolol succinate (TOPROL-XL) 25 MG 24 hr tablet TAKE 1 TABLET BY MOUTH DAILY. TAKE WITH OR IMMEDIATELY FOLLOWING A MEAL.   Na Sulfate-K Sulfate-Mg Sulf (SUPREP BOWEL PREP KIT) 17.5-3.13-1.6 GM/177ML SOLN Take 1 kit by mouth as directed.   nitroGLYCERIN (NITROSTAT) 0.4 MG SL tablet Place 1 tablet (0.4 mg total) under the tongue every 5 (five) minutes as needed for chest pain (MAX 3 TABLETS).   omeprazole (PRILOSEC) 40 MG capsule TAKE 1 CAPSULE BY MOUTH EVERY DAY   sacubitril-valsartan (ENTRESTO) 49-51 MG Take 1 tablet by mouth 2 (two) times daily.   sodium chloride (MURO 128) 2 % ophthalmic solution Place 1 drop into the left eye 3 (three) times daily.   tadalafil (CIALIS) 20 MG tablet Take by mouth as needed.     Allergies:   Fish oil   Social History   Socioeconomic History   Marital status: Married    Spouse name: Not on file   Number of children: Not on file   Years of education: Not on file   Highest education level: Not on file  Occupational History   Not on file  Tobacco Use   Smoking status: Never   Smokeless tobacco: Never  Vaping Use   Vaping Use: Never used  Substance and Sexual Activity   Alcohol use: Yes    Alcohol/week: 1.0 standard drink    Types: 1 Glasses of wine per week    Comment: RARE   Drug use: No   Sexual activity: Not on file  Other Topics Concern   Not on file  Social History Narrative   Not on file   Social Determinants of Health   Financial Resource Strain: Not on file  Food Insecurity: Not on file  Transportation Needs: Not on file  Physical Activity: Not on file  Stress: Not on file  Social Connections: Not on file     Family History: The patient's family history includes Colon cancer in his father. There is no history of Sudden death,  Hypertension, Hyperlipidemia, Heart attack, Diabetes, Rectal cancer, Stomach cancer, Esophageal cancer, or Ulcerative colitis.  ROS:   Please see the history of present illness.    Will need help getting Praluent. All other systems reviewed and are negative.  EKGs/Labs/Other Studies Reviewed:    The following studies were reviewed today: 2D Doppler echocardiogram August 2022: IMPRESSIONS     1. Mild basal to mid anterseptal and inferoseptal hypokinesis. Compared  with the echo 09/6293, systolic function has improved. Left ventricular  ejection fraction, by estimation, is 50 to 55%. The left ventricle has low  normal function. The left ventricle   demonstrates regional wall motion abnormalities (see scoring  diagram/findings  for description). There is mild concentric left  ventricular hypertrophy. Left ventricular diastolic parameters are  consistent with Grade I diastolic dysfunction (impaired  relaxation).   2. Right ventricular systolic function is normal. The right ventricular  size is normal. There is normal pulmonary artery systolic pressure.   3. Left atrial size was mildly dilated.   4. The mitral valve is normal in structure. Mild mitral valve  regurgitation. No evidence of mitral stenosis.   5. The aortic valve is tricuspid. Aortic valve regurgitation is not  visualized. No aortic stenosis is present.   6. The inferior vena cava is normal in size with greater than 50%  respiratory variability, suggesting right atrial pressure of 3 mmHg.    EKG:  EKG no new data  Recent Labs: 08/24/2020: ALT 30; BUN 17; Creatinine, Ser 0.96; Hemoglobin 14.4; Platelets 285; Potassium 4.3; Sodium 139  Recent Lipid Panel    Component Value Date/Time   CHOL 133 08/24/2020 0909   TRIG 134 08/24/2020 0909   HDL 49 08/24/2020 0909   CHOLHDL 2.7 08/24/2020 0909   CHOLHDL 3.3 03/03/2015 0744   VLDL 22 03/03/2015 0744   LDLCALC 61 08/24/2020 0909   LDLDIRECT 81 10/24/2016 1119   LDLDIRECT  91.0 02/05/2014 0809    Physical Exam:    VS:  BP 116/78    Pulse 73    Ht 6' (1.829 m)    Wt 199 lb 12.8 oz (90.6 kg)    SpO2 98%    BMI 27.10 kg/m     Wt Readings from Last 3 Encounters:  03/04/21 199 lb 12.8 oz (90.6 kg)  12/13/20 199 lb (90.3 kg)  10/19/20 199 lb (90.3 kg)     GEN: Healthy in appearance. No acute distress HEENT: Normal NECK: No JVD. LYMPHATICS: No lymphadenopathy CARDIAC: No murmur. RRR no gallop, or edema. VASCULAR:  Normal Pulses. No bruits. RESPIRATORY:  Clear to auscultation without rales, wheezing or rhonchi  ABDOMEN: Soft, non-tender, non-distended, No pulsatile mass, MUSCULOSKELETAL: No deformity  SKIN: Warm and dry NEUROLOGIC:  Alert and oriented x 3 PSYCHIATRIC:  Normal affect   ASSESSMENT:    1. Chronic systolic heart failure ([place])   2. Coronary artery disease involving coronary bypass graft of native heart with angina pectoris ([place])   3. LBBB (left bundle branch block)   4. Paroxysmal atrial fibrillation ([facility])   5. Mixed hyperlipidemia   6. Severe obstructive sleep apnea    PLAN:    In order of problems listed above:  Continue double therapy (Toprol-XL/Entresto).  Has been unable to tolerate MRA and is not on SGLT2.  With improved EF, will not push further. Secondary prevention reviewed.  Ample physical activity. Did not discuss No recent issues Continue Praluent and Vascepa. Compliance with CPAP encouraged.  Overall education and awareness concerning primary/secondary risk prevention was discussed in detail: LDL less than 70, hemoglobin A1c less than 7, blood pressure target less than 130/80 mmHg, >150 minutes of moderate aerobic activity per week, avoidance of smoking, weight control (via diet and exercise), and continued surveillance/management of/for obstructive sleep apnea.    Medication Adjustments/Labs and Tests Ordered: Current medicines are reviewed at length with the patient today.  Concerns regarding medicines are outlined  above.  Orders Placed This Encounter  Procedures   Lipid panel   Hepatic function panel   No orders of the defined types were placed in this encounter.   Patient Instructions  Medication Instructions:  Your physician recommends that you continue on your current  medications as directed. Please refer to the Current Medication list given to you today.  *If you need a refill on your cardiac medications before your next appointment, please call your pharmacy*   Lab Work: Lipid and Liver today  If you have labs (blood work) drawn today and your tests are completely normal, you will receive your results only by: [place] (if you have MyChart) OR A paper copy in the mail If you have any lab test that is abnormal or we need to change your treatment, we will call you to review the results.   Testing/Procedures: None   Follow-Up: At [facility], you and your health needs are our priority.  As part of our continuing mission to provide you with exceptional heart care, we have created designated Provider Care Teams.  These Care Teams include your primary Cardiologist (physician) and Advanced Practice Providers (APPs -  Physician Assistants and Nurse Practitioners) who all work together to provide you with the care you need, when you need it.  We recommend signing up for the patient portal called "MyChart".  Sign up information is provided on this After Visit Summary.  MyChart is used to connect with patients for Virtual Visits (Telemedicine).  Patients are able to view lab/test results, encounter notes, upcoming appointments, etc.  Non-urgent messages can be sent to your provider as well.   To learn more about what you can do with MyChart, go to [email or web address].    Your next appointment:   6 month(s)  The format for your next appointment:   In Person  Provider:   Sinclair Grooms, MD     Other Instructions     Signed, Sinclair Grooms, MD  03/04/2021 8:20 AM     [place]

## 2021-03-04 ENCOUNTER — Encounter: Payer: Self-pay | Admitting: Interventional Cardiology

## 2021-03-04 ENCOUNTER — Other Ambulatory Visit: Payer: Self-pay

## 2021-03-04 ENCOUNTER — Ambulatory Visit (INDEPENDENT_AMBULATORY_CARE_PROVIDER_SITE_OTHER): Payer: Medicare Other | Admitting: Interventional Cardiology

## 2021-03-04 VITALS — BP 116/78 | HR 73 | Ht 72.0 in | Wt 199.8 lb

## 2021-03-04 DIAGNOSIS — I480 Paroxysmal atrial fibrillation: Secondary | ICD-10-CM

## 2021-03-04 DIAGNOSIS — I25709 Atherosclerosis of coronary artery bypass graft(s), unspecified, with unspecified angina pectoris: Secondary | ICD-10-CM | POA: Diagnosis not present

## 2021-03-04 DIAGNOSIS — I5022 Chronic systolic (congestive) heart failure: Secondary | ICD-10-CM | POA: Diagnosis not present

## 2021-03-04 DIAGNOSIS — I255 Ischemic cardiomyopathy: Secondary | ICD-10-CM

## 2021-03-04 DIAGNOSIS — I447 Left bundle-branch block, unspecified: Secondary | ICD-10-CM | POA: Diagnosis not present

## 2021-03-04 DIAGNOSIS — G4733 Obstructive sleep apnea (adult) (pediatric): Secondary | ICD-10-CM

## 2021-03-04 DIAGNOSIS — E782 Mixed hyperlipidemia: Secondary | ICD-10-CM

## 2021-03-04 LAB — HEPATIC FUNCTION PANEL
ALT: 100 IU/L — ABNORMAL HIGH (ref 0–44)
AST: 52 IU/L — ABNORMAL HIGH (ref 0–40)
Albumin: 4.7 g/dL (ref 3.8–4.8)
Alkaline Phosphatase: 103 IU/L (ref 44–121)
Bilirubin Total: 0.3 mg/dL (ref 0.0–1.2)
Bilirubin, Direct: 0.12 mg/dL (ref 0.00–0.40)
Total Protein: 6.8 g/dL (ref 6.0–8.5)

## 2021-03-04 LAB — LIPID PANEL
Chol/HDL Ratio: 2.5 ratio (ref 0.0–5.0)
Cholesterol, Total: 106 mg/dL (ref 100–199)
HDL: 42 mg/dL (ref >39)
LDL Chol Calc (NIH): 42 mg/dL (ref 0–99)
Triglycerides: 125 mg/dL (ref 0–149)
VLDL Cholesterol Cal: 22 mg/dL (ref 5–40)

## 2021-03-04 NOTE — Patient Instructions (Signed)
Medication Instructions:  Your physician recommends that you continue on your current medications as directed. Please refer to the Current Medication list given to you today.  *If you need a refill on your cardiac medications before your next appointment, please call your pharmacy*   Lab Work: Lipid and Liver today  If you have labs (blood work) drawn today and your tests are completely normal, you will receive your results only by: [place] (if you have MyChart) OR A paper copy in the mail If you have any lab test that is abnormal or we need to change your treatment, we will call you to review the results.   Testing/Procedures: None   Follow-Up: At [facility], you and your health needs are our priority.  As part of our continuing mission to provide you with exceptional heart care, we have created designated Provider Care Teams.  These Care Teams include your primary Cardiologist (physician) and Advanced Practice Providers (APPs -  Physician Assistants and Nurse Practitioners) who all work together to provide you with the care you need, when you need it.  We recommend signing up for the patient portal called "MyChart".  Sign up information is provided on this After Visit Summary.  MyChart is used to connect with patients for Virtual Visits (Telemedicine).  Patients are able to view lab/test results, encounter notes, upcoming appointments, etc.  Non-urgent messages can be sent to your provider as well.   To learn more about what you can do with MyChart, go to [email or web address].    Your next appointment:   6 month(s)  The format for your next appointment:   In Person  Provider:   Sinclair Grooms, MD     Other Instructions

## 2021-03-05 ENCOUNTER — Encounter: Payer: Self-pay | Admitting: Interventional Cardiology

## 2021-03-05 DIAGNOSIS — R748 Abnormal levels of other serum enzymes: Secondary | ICD-10-CM

## 2021-03-07 MED ORDER — PRALUENT 75 MG/ML ~~LOC~~ SOAJ: 1.0000 "pen " | SUBCUTANEOUS | 11 refills | Status: DC

## 2021-03-07 NOTE — Addendum Note (Signed)
Addended by: Marcelle Overlie D on: 03/07/2021 09:12 AM   Modules accepted: Orders

## 2021-04-01 ENCOUNTER — Other Ambulatory Visit: Payer: Self-pay | Admitting: Interventional Cardiology

## 2021-04-01 MED ORDER — ENTRESTO 49-51 MG PO TABS: 1.0000 | ORAL_TABLET | Freq: Two times a day (BID) | ORAL | 3 refills | Status: DC

## 2021-04-06 ENCOUNTER — Other Ambulatory Visit: Payer: Medicare Other | Admitting: *Deleted

## 2021-04-06 ENCOUNTER — Other Ambulatory Visit: Payer: Self-pay

## 2021-04-06 DIAGNOSIS — R748 Abnormal levels of other serum enzymes: Secondary | ICD-10-CM

## 2021-04-06 LAB — HEPATIC FUNCTION PANEL
ALT: 20 IU/L (ref 0–44)
AST: 18 IU/L (ref 0–40)
Albumin: 4.7 g/dL (ref 3.8–4.8)
Alkaline Phosphatase: 85 IU/L (ref 44–121)
Bilirubin Total: 0.2 mg/dL (ref 0.0–1.2)
Bilirubin, Direct: <0.1 mg/dL (ref 0.00–0.40)
Total Protein: 6.7 g/dL (ref 6.0–8.5)

## 2021-04-21 ENCOUNTER — Ambulatory Visit (INDEPENDENT_AMBULATORY_CARE_PROVIDER_SITE_OTHER): Payer: Medicare Other

## 2021-04-21 DIAGNOSIS — I5022 Chronic systolic (congestive) heart failure: Secondary | ICD-10-CM

## 2021-04-21 DIAGNOSIS — I255 Ischemic cardiomyopathy: Secondary | ICD-10-CM

## 2021-04-22 LAB — CUP PACEART REMOTE DEVICE CHECK
Battery Remaining Longevity: 108 mo
Battery Remaining Percentage: 100 %
Brady Statistic RA Percent Paced: 2 %
Brady Statistic RV Percent Paced: 9 %
Date Time Interrogation Session: 20230414044100
HighPow Impedance: 70 Ohm
Implantable Lead Implant Date: 20180703
Implantable Lead Implant Date: 20180703
Implantable Lead Implant Date: 20180703
Implantable Lead Location: 753858
Implantable Lead Location: 753859
Implantable Lead Location: 753860
Implantable Lead Model: 293
Implantable Lead Model: 4674
Implantable Lead Model: 7741
Implantable Lead Serial Number: 433305
Implantable Lead Serial Number: 801469
Implantable Lead Serial Number: 901025
Implantable Pulse Generator Implant Date: 20180703
Lead Channel Impedance Value: 391 Ohm
Lead Channel Impedance Value: 556 Ohm
Lead Channel Impedance Value: 583 Ohm
Lead Channel Setting Pacing Amplitude: 1.5 V
Lead Channel Setting Pacing Amplitude: 2 V
Lead Channel Setting Pacing Amplitude: 2 V
Lead Channel Setting Pacing Pulse Width: 0.4 ms
Lead Channel Setting Pacing Pulse Width: 0.4 ms
Lead Channel Setting Sensing Sensitivity: 0.6 mV
Lead Channel Setting Sensing Sensitivity: 1 mV
Pulse Gen Serial Number: 169965

## 2021-05-09 NOTE — Progress Notes (Signed)
Remote ICD transmission.   

## 2021-05-13 ENCOUNTER — Telehealth: Payer: Self-pay | Admitting: Interventional Cardiology

## 2021-05-13 NOTE — Telephone Encounter (Signed)
Pt would like for nurse to call him. Please advise ?

## 2021-05-13 NOTE — Telephone Encounter (Signed)
Spoke with patient, he states his DME company for his CPAP is requesting a prescription for a new mask. He states his DME company faxed the request to our office either last week or this week on Monday. ? ?Patient was not able to give the name of his DME company while on the phone. ? ?No fax received. Will have patient contact his DME company to resend fax to our office. ?

## 2021-05-15 ENCOUNTER — Other Ambulatory Visit: Payer: Self-pay | Admitting: Interventional Cardiology

## 2021-05-24 ENCOUNTER — Other Ambulatory Visit: Payer: Self-pay | Admitting: Interventional Cardiology

## 2021-05-24 DIAGNOSIS — I480 Paroxysmal atrial fibrillation: Secondary | ICD-10-CM

## 2021-05-24 NOTE — Telephone Encounter (Signed)
Eliquis '5mg'$  refill request received. Patient is 71 years old, weight-90.6kg, Crea-0.96 on 08/24/2020, Diagnosis-Afib, and last seen by Dr. Tamala Julian on 03/04/21. Dose is appropriate based on dosing criteria. Will send in refill to requested pharmacy.   ?

## 2021-05-30 ENCOUNTER — Other Ambulatory Visit: Payer: Self-pay | Admitting: Interventional Cardiology

## 2021-07-21 ENCOUNTER — Ambulatory Visit (INDEPENDENT_AMBULATORY_CARE_PROVIDER_SITE_OTHER): Payer: Medicare Other

## 2021-07-21 DIAGNOSIS — I255 Ischemic cardiomyopathy: Secondary | ICD-10-CM

## 2021-07-21 DIAGNOSIS — Z95810 Presence of automatic (implantable) cardiac defibrillator: Secondary | ICD-10-CM

## 2021-07-26 LAB — CUP PACEART REMOTE DEVICE CHECK
Battery Remaining Longevity: 102 mo
Battery Remaining Percentage: 100 %
Brady Statistic RA Percent Paced: 2 %
Brady Statistic RV Percent Paced: 9 %
Date Time Interrogation Session: 20230717044100
HighPow Impedance: 82 Ohm
Implantable Lead Implant Date: 20180703
Implantable Lead Implant Date: 20180703
Implantable Lead Implant Date: 20180703
Implantable Lead Location: 753858
Implantable Lead Location: 753859
Implantable Lead Location: 753860
Implantable Lead Model: 293
Implantable Lead Model: 4674
Implantable Lead Model: 7741
Implantable Lead Serial Number: 433305
Implantable Lead Serial Number: 801469
Implantable Lead Serial Number: 901025
Implantable Pulse Generator Implant Date: 20180703
Lead Channel Impedance Value: 445 Ohm
Lead Channel Impedance Value: 592 Ohm
Lead Channel Impedance Value: 615 Ohm
Lead Channel Setting Pacing Amplitude: 1.5 V
Lead Channel Setting Pacing Amplitude: 2 V
Lead Channel Setting Pacing Amplitude: 2 V
Lead Channel Setting Pacing Pulse Width: 0.4 ms
Lead Channel Setting Pacing Pulse Width: 0.4 ms
Lead Channel Setting Sensing Sensitivity: 0.6 mV
Lead Channel Setting Sensing Sensitivity: 1 mV
Pulse Gen Serial Number: 169965

## 2021-08-05 NOTE — Progress Notes (Signed)
Remote ICD transmission.   

## 2021-08-26 ENCOUNTER — Other Ambulatory Visit: Payer: Self-pay | Admitting: Interventional Cardiology

## 2021-09-02 ENCOUNTER — Telehealth: Payer: Self-pay | Admitting: Interventional Cardiology

## 2021-09-02 NOTE — Telephone Encounter (Signed)
  Patient would like to have lab work done before his appointment with Dr Tamala Julian on 09/07/21. Could labs be placed?

## 2021-09-02 NOTE — Telephone Encounter (Signed)
Left message for patient informing him there are no lab orders or documentation of labs requested by provider. Provided office number for callback.

## 2021-09-06 NOTE — Progress Notes (Unsigned)
Cardiology Office Note:    Date:  09/07/2021   ID:  Tommy [name], DOB 1950/11/04, MRN [id number]  PCP:  Patient, No Pcp Per  Cardiologist:  Sinclair Grooms, MD   Referring MD: No ref. provider found   Chief Complaint  Patient presents with   Congestive Heart Failure    Systolic heart failure   Coronary Artery Disease    History of Present Illness:    Tommy [name] is a 71 y.o. male with a hx of  ischemic cardiomyopathy, exertional angina pectoris, prior coronary bypass grafting 2002 with bypass graft failure and multiple native and bypass graft stents most recently October 2018, CRT-D 2018, hypertension, hyperlipidemia and chronic systolic heart failure, AICD, brief atrial fibrillation on telemetry, and OSA.   He is doing well.  He has no cardiac complaints.  No device therapy.  Denies syncope.  No angina.  Still runs approximately 5 miles 4 days a week.  No particular symptoms.  LVEF increased from less than 40% into the normal range  Past Medical History:  Diagnosis Date   AICD (automatic cardioverter/defibrillator) present    BIV   Anginal pain ([place])    Blood transfusion without reported diagnosis    CAD (coronary artery disease)    with CABG LIMA to LAD, free radial PDA, SVG to diagonal, SVG to OM. 2002. DES May 2011 and 01-2010 to SVG of Diag, native OM, and Native RCA. Unable to stent LAD via LIMA10/18 PCI to dRCA, multi-site DES to SVG--> OM   GERD (gastroesophageal reflux disease)    Hyperlipidemia    unable to tolarate statin therapy   Hypertension    Kidney stone    OSA on CPAP 12/13/2017   Severe obstructive sleep apnea with an AHI of 30.3/h and no significant central sleep apnea.  His oxygen saturations dropped to 83%.  He is on CPAP at 8 cm H2O.   Sleep apnea    Vertigo     Past Surgical History:  Procedure Laterality Date   BIV ICD INSERTION CRT-D N/A 07/11/2016   Procedure: BiV ICD Insertion CRT-D;  Surgeon: Evans Lance, MD;  Location: [place]  [facility];  Service: Cardiovascular;  Laterality: N/A;   CHOLECYSTECTOMY     COLONOSCOPY     CORONARY ARTERY BYPASS GRAFT     CORONARY BALLOON ANGIOPLASTY N/A 10/12/2016   Procedure: CORONARY BALLOON ANGIOPLASTY;  Surgeon: Belva Crome, MD;  Location: [place] [facility];  Service: Cardiovascular;  Laterality: N/A;   CORONARY STENT INTERVENTION  10/12/2016   PTCA of focal in-stent restenosis in the second obtuse marginal reducing 90% stenosis to 0%.   CORONARY STENT INTERVENTION N/A 10/12/2016   Procedure: CORONARY STENT INTERVENTION;  Surgeon: Belva Crome, MD;  Location: [place] [facility];  Service: Cardiovascular;  Laterality: N/A;   LEFT HEART CATH AND CORS/GRAFTS ANGIOGRAPHY N/A 10/12/2016   Procedure: LEFT HEART CATH AND CORS/GRAFTS ANGIOGRAPHY;  Surgeon: Belva Crome, MD;  Location: [place];  Service: Cardiovascular;  Laterality: N/A;   LEFT HEART CATH AND CORS/GRAFTS ANGIOGRAPHY N/A 01/08/2018   Procedure: LEFT HEART CATH AND CORS/GRAFTS ANGIOGRAPHY;  Surgeon: Belva Crome, MD;  Location: [place] [facility];  Service: Cardiovascular;  Laterality: N/A;   RIGHT/LEFT HEART CATH AND CORONARY/GRAFT ANGIOGRAPHY N/A 03/01/2016   Procedure: Right/Left Heart Cath and Coronary/Graft Angiography;  Surgeon: Belva Crome, MD;  Location: [facility];  Service: Cardiovascular;  Laterality: N/A;   ULTRASOUND GUIDANCE  FOR VASCULAR ACCESS  01/08/2018   Procedure: Ultrasound Guidance For Vascular Access;  Surgeon: Belva Crome, MD;  Location: [place] [facility];  Service: Cardiovascular;;    Current Medications: Current Meds  Medication Sig   Alirocumab (PRALUENT) 75 MG/ML SOAJ Inject 1 pen into the skin every 14 (fourteen) days.   cholecalciferol (VITAMIN D3) 25 MCG (1000 UT) tablet Take 1,000 Units by mouth daily.   ELIQUIS 5 MG TABS tablet TAKE 1 TABLET BY MOUTH TWICE A DAY   ergocalciferol (VITAMIN D2) 1.25 MG (50000 UT) capsule Take 1 capsule by mouth daily.   finasteride  (PROPECIA) 1 MG tablet Take 1 mg by mouth daily.   metoprolol succinate (TOPROL-XL) 25 MG 24 hr tablet TAKE 1 TABLET BY MOUTH EVERY DAY WITH OR IMMEDIATELY FOLLOWING A MEAL   Na Sulfate-K Sulfate-Mg Sulf (SUPREP BOWEL PREP KIT) 17.5-3.13-1.6 GM/177ML SOLN Take 1 kit by mouth as directed.   nitroGLYCERIN (NITROSTAT) 0.4 MG SL tablet Place 1 tablet (0.4 mg total) under the tongue every 5 (five) minutes as needed for chest pain (MAX 3 TABLETS).   omeprazole (PRILOSEC) 40 MG capsule TAKE 1 CAPSULE BY MOUTH EVERY DAY   sacubitril-valsartan (ENTRESTO) 49-51 MG Take 1 tablet by mouth 2 (two) times daily.   sodium chloride (MURO 128) 2 % ophthalmic solution Place 1 drop into the left eye 3 (three) times daily.   tadalafil (CIALIS) 20 MG tablet Take by mouth as needed.   VASCEPA 1 g capsule TAKE 2 CAPSULES BY MOUTH 2 TIMES DAILY.     Allergies:   Fish oil   Social History   Socioeconomic History   Marital status: Married    Spouse name: Not on file   Number of children: Not on file   Years of education: Not on file   Highest education level: Not on file  Occupational History   Not on file  Tobacco Use   Smoking status: Never   Smokeless tobacco: Never  Vaping Use   Vaping Use: Never used  Substance and Sexual Activity   Alcohol use: Yes    Alcohol/week: 1.0 standard drink of alcohol    Types: 1 Glasses of wine per week    Comment: RARE   Drug use: No   Sexual activity: Not on file  Other Topics Concern   Not on file  Social History Narrative   Not on file   Social Determinants of Health   Financial Resource Strain: Not on file  Food Insecurity: Not on file  Transportation Needs: Not on file  Physical Activity: Not on file  Stress: Not on file  Social Connections: Not on file     Family History: The patient's family history includes Colon cancer in his father. There is no history of Sudden death, Hypertension, Hyperlipidemia, Heart attack, Diabetes, Rectal cancer, Stomach  cancer, Esophageal cancer, or Ulcerative colitis.  ROS:   Please see the history of present illness.    Was having bruising on Eliquis therapy.  This is resolved on riboflavin ordered.  All other systems reviewed and are negative.  EKGs/Labs/Other Studies Reviewed:    The following studies were reviewed today:  2D Doppler echocardiogram August 2022: IMPRESSIONS   1. Mild basal to mid anterseptal and inferoseptal hypokinesis. Compared  with the echo 01/5054, systolic function has improved. Left ventricular  ejection fraction, by estimation, is 50 to 55%. The left ventricle has low  normal function. The left ventricle   demonstrates regional wall motion abnormalities (see scoring  diagram/findings for description). There is mild concentric left  ventricular hypertrophy. Left ventricular diastolic parameters are  consistent with Grade I diastolic dysfunction (impaired  relaxation).   2. Right ventricular systolic function is normal. The right ventricular  size is normal. There is normal pulmonary artery systolic pressure.   3. Left atrial size was mildly dilated.   4. The mitral valve is normal in structure. Mild mitral valve  regurgitation. No evidence of mitral stenosis.   5. The aortic valve is tricuspid. Aortic valve regurgitation is not  visualized. No aortic stenosis is present.   6. The inferior vena cava is normal in size with greater than 50%  respiratory variability, suggesting right atrial pressure of 3 mmHg.    EKG:  EKG sinus rhythm with ventricular tracking.  No change compared to prior tracing.  Rate 62 bpm.  Recent Labs: 04/06/2021: ALT 20  Recent Lipid Panel    Component Value Date/Time   CHOL 106 03/04/2021 0822   TRIG 125 03/04/2021 0822   HDL 42 03/04/2021 0822   CHOLHDL 2.5 03/04/2021 0822   CHOLHDL 3.3 03/03/2015 0744   VLDL 22 03/03/2015 0744   LDLCALC 42 03/04/2021 0822   LDLDIRECT 81 10/24/2016 1119   LDLDIRECT 91.0 02/05/2014 0809    Physical  Exam:    VS:  BP 124/80   Pulse 68   Ht 6' (1.829 m)   Wt 200 lb (90.7 kg)   SpO2 98%   BMI 27.12 kg/m     Wt Readings from Last 3 Encounters:  09/07/21 200 lb (90.7 kg)  03/04/21 199 lb 12.8 oz (90.6 kg)  12/13/20 199 lb (90.3 kg)     GEN: Slender and appears younger than stated age.. No acute distress HEENT: Normal NECK: No JVD. LYMPHATICS: No lymphadenopathy CARDIAC: No murmur. RRR S4 but no S3 gallop, or edema. VASCULAR:  Normal Pulses. No bruits. RESPIRATORY:  Clear to auscultation without rales, wheezing or rhonchi  ABDOMEN: Soft, non-tender, non-distended, No pulsatile mass, MUSCULOSKELETAL: No deformity  SKIN: Warm and dry NEUROLOGIC:  Alert and oriented x 3 PSYCHIATRIC:  Normal affect   ASSESSMENT:    1. Coronary artery disease involving coronary bypass graft of native heart with angina pectoris ([place])   2. Chronic systolic heart failure (H[facility])   3. Paroxysmal atrial fibrillation ([place])   4. Biventricular automatic implantable cardioverter defibrillator in situ   5. Severe obstructive sleep apnea   6. Mixed hyperlipidemia   7. HYPERTENSION, BENIGN SYSTEMIC    PLAN:    In order of problems listed above:  Secondary prevention discussed.  He is very motivated and gets much more than 150 minutes of moderate activity per week.  Not on antiplatelet therapy due to Eliquis.  On Eliquis and aspirin he was having excessive nuisance bleeding. Uptitrate intensity of heart failure regimen by adding SGLT2 (Farxiga 10 mg/day).  We will do with this comprehensive metabolic panel in 2 weeks.  He will probably not be able to tolerate an MRA because of blood pressure. Continue Eliquis therapy. Continue follow-up in device clinic. CPAP compliance is recommended. Continue Vascepa, Praluent, diet, and weight control. Excellent control on heart failure regimen.   Guideline directed therapy for left ventricular systolic dysfunction: Angiotensin receptor-neprilysin inhibitor  (ARNI)-Entresto; beta-blocker therapy - carvedilol, metoprolol succinate, or bisoprolol; mineralocorticoid receptor antagonist (MRA) therapy -spironolactone or eplerenone.  SGLT-2 agents -  Dapagliflozin [place]) or Empagliflozin ([place]).These therapies have been shown to improve clinical outcomes including reduction of rehospitalization, survival, and acute heart failure.  We will request Dr. Sallyanne Kuster as cardiologist going forward.   Medication Adjustments/Labs and Tests Ordered: Current medicines are reviewed at length with the patient today.  Concerns regarding medicines are outlined above.  No orders of the defined types were placed in this encounter.  No orders of the defined types were placed in this encounter.   There are no Patient Instructions on file for this visit.   Signed, Sinclair Grooms, MD  09/07/2021 8:27 AM    Deal

## 2021-09-07 ENCOUNTER — Encounter: Payer: Self-pay | Admitting: Interventional Cardiology

## 2021-09-07 ENCOUNTER — Ambulatory Visit: Payer: Medicare Other | Attending: Interventional Cardiology | Admitting: Interventional Cardiology

## 2021-09-07 VITALS — BP 124/80 | HR 68 | Ht 72.0 in | Wt 200.0 lb

## 2021-09-07 DIAGNOSIS — E782 Mixed hyperlipidemia: Secondary | ICD-10-CM | POA: Insufficient documentation

## 2021-09-07 DIAGNOSIS — I255 Ischemic cardiomyopathy: Secondary | ICD-10-CM | POA: Diagnosis not present

## 2021-09-07 DIAGNOSIS — I10 Essential (primary) hypertension: Secondary | ICD-10-CM | POA: Insufficient documentation

## 2021-09-07 DIAGNOSIS — I480 Paroxysmal atrial fibrillation: Secondary | ICD-10-CM | POA: Insufficient documentation

## 2021-09-07 DIAGNOSIS — I25709 Atherosclerosis of coronary artery bypass graft(s), unspecified, with unspecified angina pectoris: Secondary | ICD-10-CM | POA: Insufficient documentation

## 2021-09-07 DIAGNOSIS — I5022 Chronic systolic (congestive) heart failure: Secondary | ICD-10-CM | POA: Diagnosis present

## 2021-09-07 DIAGNOSIS — G4733 Obstructive sleep apnea (adult) (pediatric): Secondary | ICD-10-CM | POA: Diagnosis present

## 2021-09-07 DIAGNOSIS — Z95810 Presence of automatic (implantable) cardiac defibrillator: Secondary | ICD-10-CM | POA: Diagnosis present

## 2021-09-07 MED ORDER — DAPAGLIFLOZIN PROPANEDIOL 10 MG PO TABS: 10.0000 mg | ORAL_TABLET | Freq: Every day | ORAL | 11 refills | Status: DC

## 2021-09-07 NOTE — Patient Instructions (Signed)
Medication Instructions:  Your physician has recommended you make the following change in your medication:   1) START Farxiga '10mg'$  daily (in the morning)  *If you need a refill on your cardiac medications before your next appointment, please call your pharmacy*  Lab Work: In 2 weeks: CMET, Lipid If you have labs (blood work) drawn today and your tests are completely normal, you will receive your results only by: [place] (if you have MyChart) OR A paper copy in the mail If you have any lab test that is abnormal or we need to change your treatment, we will call you to review the results.  Testing/Procedures: Your physician has requested that you have an echocardiogram January 2024. Echocardiography is a painless test that uses sound waves to create images of your heart. It provides your doctor with information about the size and shape of your heart and how well your heart's chambers and valves are working. This procedure takes approximately one hour. There are no restrictions for this procedure.   Follow-Up: At [facility], you and your health needs are our priority.  As part of our continuing mission to provide you with exceptional heart care, we have created designated Provider Care Teams.  These Care Teams include your primary Cardiologist (physician) and Advanced Practice Providers (APPs -  Physician Assistants and Nurse Practitioners) who all work together to provide you with the care you need, when you need it.  Your next appointment:   6 day(s)  The format for your next appointment:   In Person  Provider:   Dr. Sallyanne Kuster  Important Information About Sugar

## 2021-09-27 ENCOUNTER — Ambulatory Visit: Payer: Medicare Other | Attending: Interventional Cardiology

## 2021-09-27 DIAGNOSIS — I10 Essential (primary) hypertension: Secondary | ICD-10-CM | POA: Insufficient documentation

## 2021-09-27 DIAGNOSIS — I5022 Chronic systolic (congestive) heart failure: Secondary | ICD-10-CM | POA: Insufficient documentation

## 2021-09-27 DIAGNOSIS — E782 Mixed hyperlipidemia: Secondary | ICD-10-CM | POA: Insufficient documentation

## 2021-09-27 DIAGNOSIS — I25709 Atherosclerosis of coronary artery bypass graft(s), unspecified, with unspecified angina pectoris: Secondary | ICD-10-CM | POA: Insufficient documentation

## 2021-09-27 LAB — COMPREHENSIVE METABOLIC PANEL
ALT: 27 IU/L (ref 0–44)
AST: 19 IU/L (ref 0–40)
Albumin/Globulin Ratio: 2.5 — ABNORMAL HIGH (ref 1.2–2.2)
Albumin: 4.9 g/dL — ABNORMAL HIGH (ref 3.8–4.8)
Alkaline Phosphatase: 80 IU/L (ref 44–121)
BUN/Creatinine Ratio: 21 (ref 10–24)
BUN: 21 mg/dL (ref 8–27)
Bilirubin Total: 0.6 mg/dL (ref 0.0–1.2)
CO2: 27 mmol/L (ref 20–29)
Calcium: 9.3 mg/dL (ref 8.6–10.2)
Chloride: 101 mmol/L (ref 96–106)
Creatinine, Ser: 0.99 mg/dL (ref 0.76–1.27)
Globulin, Total: 2 g/dL (ref 1.5–4.5)
Glucose: 104 mg/dL — ABNORMAL HIGH (ref 70–99)
Potassium: 4.5 mmol/L (ref 3.5–5.2)
Sodium: 139 mmol/L (ref 134–144)
Total Protein: 6.9 g/dL (ref 6.0–8.5)
eGFR: 81 mL/min/{1.73_m2} (ref >59)

## 2021-09-27 LAB — LIPID PANEL
Chol/HDL Ratio: 2.6 ratio (ref 0.0–5.0)
Cholesterol, Total: 123 mg/dL (ref 100–199)
HDL: 48 mg/dL (ref >39)
LDL Chol Calc (NIH): 56 mg/dL (ref 0–99)
Triglycerides: 105 mg/dL (ref 0–149)
VLDL Cholesterol Cal: 19 mg/dL (ref 5–40)

## 2021-10-20 ENCOUNTER — Ambulatory Visit (INDEPENDENT_AMBULATORY_CARE_PROVIDER_SITE_OTHER): Payer: Medicare Other

## 2021-10-20 DIAGNOSIS — I255 Ischemic cardiomyopathy: Secondary | ICD-10-CM

## 2021-10-24 LAB — CUP PACEART REMOTE DEVICE CHECK
Battery Remaining Longevity: 102 mo
Battery Remaining Percentage: 100 %
Brady Statistic RA Percent Paced: 2 %
Brady Statistic RV Percent Paced: 8 %
Date Time Interrogation Session: 20231013144400
HighPow Impedance: 76 Ohm
Implantable Lead Implant Date: 20180703
Implantable Lead Implant Date: 20180703
Implantable Lead Implant Date: 20180703
Implantable Lead Location: 753858
Implantable Lead Location: 753859
Implantable Lead Location: 753860
Implantable Lead Model: 293
Implantable Lead Model: 4674
Implantable Lead Model: 7741
Implantable Lead Serial Number: 433305
Implantable Lead Serial Number: 801469
Implantable Lead Serial Number: 901025
Implantable Pulse Generator Implant Date: 20180703
Lead Channel Impedance Value: 423 Ohm
Lead Channel Impedance Value: 561 Ohm
Lead Channel Impedance Value: 564 Ohm
Lead Channel Setting Pacing Amplitude: 1.5 V
Lead Channel Setting Pacing Amplitude: 2 V
Lead Channel Setting Pacing Amplitude: 2 V
Lead Channel Setting Pacing Pulse Width: 0.4 ms
Lead Channel Setting Pacing Pulse Width: 0.4 ms
Lead Channel Setting Sensing Sensitivity: 0.6 mV
Lead Channel Setting Sensing Sensitivity: 1 mV
Pulse Gen Serial Number: 169965

## 2021-10-27 NOTE — Progress Notes (Signed)
Remote ICD transmission.   

## 2021-11-22 ENCOUNTER — Other Ambulatory Visit: Payer: Self-pay | Admitting: Interventional Cardiology

## 2021-11-22 DIAGNOSIS — I480 Paroxysmal atrial fibrillation: Secondary | ICD-10-CM

## 2021-11-22 NOTE — Telephone Encounter (Signed)
Prescription refill request for Eliquis received. Indication:afib Last office visit:8/23 Scr:0.9 Age: 71 Weight:90.7 kg  Prescription refilled

## 2022-01-16 ENCOUNTER — Encounter: Payer: Self-pay | Admitting: Pharmacist

## 2022-01-16 NOTE — Telephone Encounter (Signed)
This encounter was created in error - please disregard.

## 2022-01-17 ENCOUNTER — Ambulatory Visit (HOSPITAL_COMMUNITY): Payer: PRIVATE HEALTH INSURANCE | Attending: Interventional Cardiology

## 2022-01-17 DIAGNOSIS — I5022 Chronic systolic (congestive) heart failure: Secondary | ICD-10-CM

## 2022-01-17 LAB — ECHOCARDIOGRAM COMPLETE
Area-P 1/2: 3.34 cm2
MV M vel: 4.96 m/s
MV Peak grad: 98.4 mmHg
Radius: 0.4 cm
S' Lateral: 3.8 cm

## 2022-01-19 ENCOUNTER — Ambulatory Visit (INDEPENDENT_AMBULATORY_CARE_PROVIDER_SITE_OTHER): Payer: PRIVATE HEALTH INSURANCE

## 2022-01-19 DIAGNOSIS — I255 Ischemic cardiomyopathy: Secondary | ICD-10-CM

## 2022-01-23 ENCOUNTER — Other Ambulatory Visit (HOSPITAL_COMMUNITY): Payer: Self-pay

## 2022-01-23 ENCOUNTER — Telehealth: Payer: Self-pay

## 2022-01-23 LAB — CUP PACEART REMOTE DEVICE CHECK
Battery Remaining Longevity: 96 mo
Battery Remaining Percentage: 100 %
Brady Statistic RA Percent Paced: 2 %
Brady Statistic RV Percent Paced: 8 %
Date Time Interrogation Session: 20240112133500
HighPow Impedance: 79 Ohm
Implantable Lead Connection Status: 753985
Implantable Lead Connection Status: 753985
Implantable Lead Connection Status: 753985
Implantable Lead Implant Date: 20180703
Implantable Lead Implant Date: 20180703
Implantable Lead Implant Date: 20180703
Implantable Lead Location: 753858
Implantable Lead Location: 753859
Implantable Lead Location: 753860
Implantable Lead Model: 293
Implantable Lead Model: 4674
Implantable Lead Model: 7741
Implantable Lead Serial Number: 433305
Implantable Lead Serial Number: 801469
Implantable Lead Serial Number: 901025
Implantable Pulse Generator Implant Date: 20180703
Lead Channel Impedance Value: 373 Ohm
Lead Channel Impedance Value: 571 Ohm
Lead Channel Impedance Value: 575 Ohm
Lead Channel Setting Pacing Amplitude: 1.5 V
Lead Channel Setting Pacing Amplitude: 2 V
Lead Channel Setting Pacing Amplitude: 2 V
Lead Channel Setting Pacing Pulse Width: 0.4 ms
Lead Channel Setting Pacing Pulse Width: 0.4 ms
Lead Channel Setting Sensing Sensitivity: 0.6 mV
Lead Channel Setting Sensing Sensitivity: 1 mV
Pulse Gen Serial Number: 169965

## 2022-01-23 NOTE — Telephone Encounter (Signed)
Pharmacy Patient Advocate Encounter  Prior Authorization for Praluent '75mg'$ /ml has been approved.     Effective dates: 1.15.24 through 1.15.25  Spoke with Pharmacy to process.

## 2022-01-23 NOTE — Telephone Encounter (Signed)
Pharmacy Patient Advocate Encounter   Received notification from [facility] that prior authorization for Praluent '75mg'$ /ml is required/requested.    PA submitted on 1.15.24 to (ins) [employer] via CoverMyMeds Key [id number]  Status is pending

## 2022-01-26 ENCOUNTER — Other Ambulatory Visit: Payer: Self-pay | Admitting: Interventional Cardiology

## 2022-01-27 MED ORDER — ENTRESTO 49-51 MG PO TABS: 1.0000 | ORAL_TABLET | Freq: Two times a day (BID) | ORAL | 2 refills | Status: DC

## 2022-02-08 NOTE — Progress Notes (Signed)
Remote ICD transmission.   

## 2022-02-10 ENCOUNTER — Other Ambulatory Visit: Payer: Self-pay

## 2022-02-10 MED ORDER — ICOSAPENT ETHYL 1 G PO CAPS: ORAL_CAPSULE | ORAL | 2 refills | Status: DC

## 2022-02-13 ENCOUNTER — Encounter: Payer: Self-pay | Admitting: Cardiovascular Disease

## 2022-02-13 ENCOUNTER — Ambulatory Visit: Payer: HMO | Attending: Cardiovascular Disease | Admitting: Cardiovascular Disease

## 2022-02-13 VITALS — BP 116/78 | HR 73 | Ht 72.0 in | Wt 195.0 lb

## 2022-02-13 DIAGNOSIS — D6869 Other thrombophilia: Secondary | ICD-10-CM | POA: Diagnosis not present

## 2022-02-13 DIAGNOSIS — G4733 Obstructive sleep apnea (adult) (pediatric): Secondary | ICD-10-CM

## 2022-02-13 DIAGNOSIS — I5022 Chronic systolic (congestive) heart failure: Secondary | ICD-10-CM

## 2022-02-13 DIAGNOSIS — K219 Gastro-esophageal reflux disease without esophagitis: Secondary | ICD-10-CM

## 2022-02-13 DIAGNOSIS — T466X5A Adverse effect of antihyperlipidemic and antiarteriosclerotic drugs, initial encounter: Secondary | ICD-10-CM

## 2022-02-13 DIAGNOSIS — I25708 Atherosclerosis of coronary artery bypass graft(s), unspecified, with other forms of angina pectoris: Secondary | ICD-10-CM | POA: Diagnosis not present

## 2022-02-13 DIAGNOSIS — G720 Drug-induced myopathy: Secondary | ICD-10-CM

## 2022-02-13 DIAGNOSIS — Z95810 Presence of automatic (implantable) cardiac defibrillator: Secondary | ICD-10-CM

## 2022-02-13 DIAGNOSIS — I480 Paroxysmal atrial fibrillation: Secondary | ICD-10-CM | POA: Diagnosis not present

## 2022-02-13 DIAGNOSIS — E7800 Pure hypercholesterolemia, unspecified: Secondary | ICD-10-CM

## 2022-02-13 DIAGNOSIS — I25709 Atherosclerosis of coronary artery bypass graft(s), unspecified, with unspecified angina pectoris: Secondary | ICD-10-CM

## 2022-02-13 NOTE — Progress Notes (Signed)
Cardiology Office Note:    Date:  02/19/2022   ID:  [name], DOB Aug 26, 1950, MRN [id number]  PCP:  Patient, No Pcp Per   [facility] Cardiologist:  Tommy Grooms, MD (Inactive)     Referring MD: No ref. provider found   Chief Complaint  Patient presents with   Coronary Artery Disease  Here to establish care after Tommy [name] retirement.  History of Present Illness:    Tommy [name] is a 72 y.o. male with a hx of CAD s/p CABG 2002 with bypass graft failure and subsequent PCI (last PCI 2018, last cath 2019), mild ischemic CMP (recovered to EF 50-55% by echo 01/17/2022), s/p CRT-D [facility] 2018, Tommy [name]), HTN, HLP intolerant to statins, on Praluent, GERD, OSA on CPAP history of nephrolithiasis.  He has been cared for by Tommy [name] for very long time and is now transferring care with Tommy [name] recent retirement. He had CABG at age 89 in 2002, started receiving multiple stents to native RCA, native LCX-OM1 and SVG-diagonal in 2011-2012 and then again in 2018.  In 2018 his EF dropped to 20-25%.  He underwent stenting of the SVG to the diagonal artery.  He started treatment with Entresto.  He received a biventricular pacemaker defibrillator.  MRI showed little scar and a disproportionate duction LVEF.  He subsequently had marked recovery of LV function, almost back to normal.  He has been athletic his whole life and has run numerous marathons.  He still plays doubles tennis 3-4 times a week and runs the other days of the week (typically 3-5 miles in an hour).  He does not feel limited by shortness of breath or chest pain.  About 8 years ago he was placed on statin and had to stop it because he "could not run".  His muscles were very weak.  This happened with more than 1 agent.  He has been on Praluent with good reduction in his LDL cholesterol since then.  He denies problems with palpitations, dizziness, syncope or defibrillator discharges.  Has not had  edema, orthopnea, PND, claudication, focal neurological events, falls, bleeding problems.  He is on chronic anticoagulation with Eliquis.  Most recent labs performed about 4 months ago showed good lipid parameters (HDL 48, LDL 56, normal triglycerides), normal renal function (creatinine 0.99) and no anemia (hemoglobin 14.4.  Electrolytes were normal.  Has statin myopathy.  His lipid-lowering is with Praluent and Vascepa.  Heart failure regimen includes Entresto 49-51 bid and Toprol XL 25 mg daily and Farxiga 10 mg daily.  He does not require loop diuretics.  Other than the low-dose beta-blocker, he is not on other antianginals and has not had any recent angina pectoris.  Has not taken any nitroglycerin in the last year.  Has used tadalafil, but not using recently.  Anticoagulation is with apixaban 5 mg twice daily.  Not on aspirin.  Most recent PCI in October 2018: - POBA in-stent restenosis OM1 - POBA in-stent restenosis mid RCA - de novo stents SVG-Diagonal (proximal 3.5 x 26 Onyx; distal anastomosis 2.5 x 12 Onyx) - de novo stent distal RCA (3.5 x 18 Onyx)  His most recent cardiac catheterization 2019 shows advanced native and graft disease, summarized as follows: - Occluded proximal LAD, patent LIMA to LAD, 95% stenosis in distal LAD beyond bypass (not amenable to PCI due to tortuosity of bypass); occluded proximal diagonal artery with patent SVG to diagonal (stents in this vessel are the most  recent PCI placed in 2018) - Patent stent in the proximal OM1 vessel with 30% in-stent restenosis, occluded SVG to OM - " Full metal jacket" of the proximal native right coronary artery with a 60% stenosis in the middle portion of this stented vessel, " nondilatable" by previous attempts.  The SVG to RCA is occluded.  In 2018 he underwent cardiac MRI that showed only very limited scar in the basal-mid septum and anterior wall with a disproportionate reduction in LVEF at 29%.  He subsequently had multiple  PCI and CRT-D implantation.  This is his first in office device check since January 08, 2018.  He has been doing remote downloads every 3 months and these have been reviewed by Tommy [name].  Device function is normal.  Estimated generator longevity is another 8 years.  Charge time was normal.  All lead parameters were checked today and found to be normal.  Atrial sensitivity was adjusted slightly to 1.0 mV due to occasional far field R wave oversensing.  Lower rate limit is programmed at 50 bpm and he almost never requires atrial pacing that he has excellent biventricular pacing.  His device is programmed with a VT 1 zone at 190 bpm with ATP followed by shocks, a second VT zone at 210 bpm and a VF zone with ATP during charging at 240 bpm.  Counters show 2% atrial pacing and 97% LVA pacing.  The burden of atrial fibrillation has been less than 1%.  The most recent episode that I can detect was on February 3 of this year and lasted for just over 2 minutes, with good ventricular rate control.  Heart rate histograms are excellent.  His device has not recorded any nonsustained ventricular tachycardia since June 2021.  There have been rare episodes of pacemaker mediated tachycardia, most recently on February 3 of this year.  Past Medical History:  Diagnosis Date   AICD (automatic cardioverter/defibrillator) present    BIV   Anginal pain ([place])    Blood transfusion without reported diagnosis    CAD (coronary artery disease)    with CABG LIMA to LAD, free radial PDA, SVG to diagonal, SVG to OM. 2002. DES May 2011 and 01-2010 to SVG of Diag, native OM, and Native RCA. Unable to stent LAD via LIMA10/18 PCI to dRCA, multi-site DES to SVG--> OM   GERD (gastroesophageal reflux disease)    Hyperlipidemia    unable to tolarate statin therapy   Hypertension    Kidney stone    OSA on CPAP 12/13/2017   Severe obstructive sleep apnea with an AHI of 30.3/h and no significant central sleep apnea.  His oxygen saturations  dropped to 83%.  He is on CPAP at 8 cm H2O.   Sleep apnea    Vertigo     Past Surgical History:  Procedure Laterality Date   BIV ICD INSERTION CRT-D N/A 07/11/2016   Procedure: BiV ICD Insertion CRT-D;  Surgeon: Evans Lance, MD;  Location: [place] [facility];  Service: Cardiovascular;  Laterality: N/A;   CHOLECYSTECTOMY     COLONOSCOPY     CORONARY ARTERY BYPASS GRAFT     CORONARY BALLOON ANGIOPLASTY N/A 10/12/2016   Procedure: CORONARY BALLOON ANGIOPLASTY;  Surgeon: Belva Crome, MD;  Location: [place] [facility];  Service: Cardiovascular;  Laterality: N/A;   CORONARY STENT INTERVENTION  10/12/2016   PTCA of focal in-stent restenosis in the second obtuse marginal reducing 90% stenosis to 0%.   CORONARY STENT INTERVENTION N/A 10/12/2016  Procedure: CORONARY STENT INTERVENTION;  Surgeon: Belva Crome, MD;  Location: [place] [facility];  Service: Cardiovascular;  Laterality: N/A;   LEFT HEART CATH AND CORS/GRAFTS ANGIOGRAPHY N/A 10/12/2016   Procedure: LEFT HEART CATH AND CORS/GRAFTS ANGIOGRAPHY;  Surgeon: Belva Crome, MD;  Location: [place] [facility];  Service: Cardiovascular;  Laterality: N/A;   LEFT HEART CATH AND CORS/GRAFTS ANGIOGRAPHY N/A 01/08/2018   Procedure: LEFT HEART CATH AND CORS/GRAFTS ANGIOGRAPHY;  Surgeon: Belva Crome, MD;  Location: [place] [facility];  Service: Cardiovascular;  Laterality: N/A;   RIGHT/LEFT HEART CATH AND CORONARY/GRAFT ANGIOGRAPHY N/A 03/01/2016   Procedure: Right/Left Heart Cath and Coronary/Graft Angiography;  Surgeon: Belva Crome, MD;  Location: [place] [facility];  Service: Cardiovascular;  Laterality: N/A;   ULTRASOUND GUIDANCE FOR VASCULAR ACCESS  01/08/2018   Procedure: Ultrasound Guidance For Vascular Access;  Surgeon: Belva Crome, MD;  Location: [place] [facility];  Service: Cardiovascular;;    Current Medications: Current Meds  Medication Sig   Alirocumab (PRALUENT) 75 MG/ML SOAJ INJECT 1 PEN INTO THE SKIN EVERY 14 (FOURTEEN)  DAYS.   cholecalciferol (VITAMIN D3) 25 MCG (1000 UT) tablet Take 1,000 Units by mouth daily.   ELIQUIS 5 MG TABS tablet TAKE 1 TABLET BY MOUTH TWICE A DAY   ergocalciferol (VITAMIN D2) 1.25 MG (50000 UT) capsule Take 1 capsule by mouth daily.   finasteride (PROPECIA) 1 MG tablet Take 1 mg by mouth daily.   icosapent Ethyl (VASCEPA) 1 g capsule TAKE 2 CAPSULES BY MOUTH 2 TIMES DAILY.   metoprolol succinate (TOPROL-XL) 25 MG 24 hr tablet TAKE 1 TABLET BY MOUTH EVERY DAY WITH OR IMMEDIATELY FOLLOWING A MEAL   omeprazole (PRILOSEC) 40 MG capsule TAKE 1 CAPSULE BY MOUTH EVERY DAY   prednisoLONE acetate (PRED FORTE) 1 % ophthalmic suspension Place 1 drop into the left eye every other day.   sacubitril-valsartan (ENTRESTO) 49-51 MG Take 1 tablet by mouth 2 (two) times daily.   sodium chloride (MURO 128) 2 % ophthalmic solution Place 1 drop into the left eye 3 (three) times daily.     Allergies:   Fish oil   Social History   Socioeconomic History   Marital status: Married    Spouse name: Not on file   Number of children: Not on file   Years of education: Not on file   Highest education level: Not on file  Occupational History   Not on file  Tobacco Use   Smoking status: Never   Smokeless tobacco: Never  Vaping Use   Vaping Use: Never used  Substance and Sexual Activity   Alcohol use: Yes    Alcohol/week: 1.0 standard drink of alcohol    Types: 1 Glasses of wine per week    Comment: RARE   Drug use: No   Sexual activity: Not on file  Other Topics Concern   Not on file  Social History Narrative   Not on file   Social Determinants of Health   Financial Resource Strain: Not on file  Food Insecurity: Not on file  Transportation Needs: Not on file  Physical Activity: Not on file  Stress: Not on file  Social Connections: Not on file     Family History: The patient's family history includes Colon cancer in his father. There is no history of Sudden death, Hypertension,  Hyperlipidemia, Heart attack, Diabetes, Rectal cancer, Stomach cancer, Esophageal cancer, or Ulcerative colitis.  ROS:   Please see the history of  present illness.     All other systems reviewed and are negative.  EKGs/Labs/Other Studies Reviewed:    The following studies were reviewed today: Cardiac MRI in 2018 1) Moderate LVE with diffuse hypokinesis and marked dyssynergy between the septum and lateral walls. EF 29% patient would appear to benefit from biventricular pacing 2) Small area of subendocardial scar in the mid and basal septum and anterior wall 3) Mild MR 4) Mild LAE 5) Normal RV    Cardiac catheterization 01/08/2018 Bypass graft occlusive disease with total occlusion of the free radial to RCA, and SVG to OM. Patent LIMA to LAD with high-grade obstruction distal to the LIMA insertion site.  Not significantly changed compared to October 2018.  I am concerned about the stenosis in the distal LAD but feel that delivery of the stent would not be possible because of tortuosity and distance from guide catheter. Widely patent saphenous vein graft to the large first diagonal with widely patent stents placed in October 2018.   Native right coronary patent with eccentric 60% narrowing in the mid vessel at site of gap in what is otherwise a full metal jacket.  This was non-dilatable up to 20 atm in October 2018.  The newly placed stent in the distal RCA is widely patent.  I am concerned about the mid vessel stenosis.  If we are unable to control angina with medication may consider shockwave or laser for the non-dilatable lesion. Circumflex is diffusely diseased.  The large first obtuse marginal which was restented in 2018 remains widely patent but with diffuse 30% ISR in the newly placed stent. Patent left main. LV function was poorly assessed but there is normal anterior wall motion.  Estimated ejection fraction greater than 35 to 40%.  LVEDP was normal. Diagnostic Dominance: Right     Echocardiogram 01/17/2022   1. Left ventricular ejection fraction, by estimation, is 50 to 55%. The  left ventricle has low normal function. The left ventricle demonstrates  regional wall motion abnormalities (see scoring diagram/findings for  description). Left ventricular diastolic   parameters are consistent with Grade I diastolic dysfunction (impaired  relaxation).   2. Right ventricular systolic function is normal. The right ventricular  size is normal. There is normal pulmonary artery systolic pressure. The  estimated right ventricular systolic pressure is [id number] mmHg.   3. The mitral valve is normal in structure. Mild mitral valve  regurgitation. No evidence of mitral stenosis.   4. The aortic valve is tricuspid. Aortic valve regurgitation is not  visualized. No aortic stenosis is present.   5. The inferior vena cava is normal in size with greater than 50%  respiratory variability, suggesting right atrial pressure of 3 mmHg.   LV Wall Scoring:  The basal inferolateral segment and basal inferior segment are akinetic.    EKG:  EKG is ordered today.  The ekg ordered today demonstrates atrial sensed, biventricular paced rhythm with RBBB pattern, QRS 150 ms.  QTc 504 ms.  Recent Labs: 09/27/2021: ALT 27; BUN 21; Creatinine, Ser 0.99; Potassium 4.5; Sodium 139  Recent Lipid Panel    Component Value Date/Time   CHOL 123 09/27/2021 0924   TRIG 105 09/27/2021 0924   HDL 48 09/27/2021 0924   CHOLHDL 2.6 09/27/2021 0924   CHOLHDL 3.3 03/03/2015 0744   VLDL 22 03/03/2015 0744   LDLCALC 56 09/27/2021 0924   LDLDIRECT 81 10/24/2016 1119   LDLDIRECT 91.0 02/05/2014 0809     Risk Assessment/Calculations:    CHA2DS2-VASc Score =  4   This indicates a 4.8% annual risk of stroke. The patient's score is based upon: CHF History: 1 HTN History: 1 Diabetes History: 0 Stroke History: 0 Vascular Disease History: 1 Age Score: 1 Gender Score: 0            Physical Exam:    VS:   BP 116/78 (BP Location: Left Arm, Patient Position: Sitting, Cuff Size: Normal)   Pulse 73   Ht 6' (1.829 m)   Wt 195 lb (88.5 kg)   SpO2 96%   BMI 26.45 kg/m     Wt Readings from Last 3 Encounters:  02/13/22 195 lb (88.5 kg)  09/07/21 200 lb (90.7 kg)  03/04/21 199 lb 12.8 oz (90.6 kg)     GEN: Lean, fit appears younger than stated age. Well nourished, well developed in no acute distress. Healthy L subclavian ICD site. HEENT: Normal NECK: No JVD; No carotid bruits LYMPHATICS: No lymphadenopathy CARDIAC: RRR, no murmurs, rubs, gallops RESPIRATORY:  Clear to auscultation without rales, wheezing or rhonchi  ABDOMEN: Soft, non-tender, non-distended MUSCULOSKELETAL:  No edema; No deformity  SKIN: Warm and dry NEUROLOGIC:  Alert and oriented x 3 PSYCHIATRIC:  Normal affect   ASSESSMENT:    1. Chronic systolic heart failure ([place])   2. Coronary artery disease of bypass graft of native heart with stable angina pectoris (HCC)   3. Paroxysmal atrial fibrillation ([place])   4. Acquired thrombophilia ([place])   5. Biventricular automatic implantable cardioverter defibrillator in situ   6. Hypercholesterolemia   7. Statin myopathy   8. Gastroesophageal reflux disease, unspecified whether esophagitis present   9. OSA on CPAP    PLAN:    In order of problems listed above:  CHF:  recovered EF. NYHA class 1 and euvolemic without loop diuretics. On ARNI/SGLT2 inh and beta blocker.  On appropriate lipid-lowering therapy.  Not on aspirin due to full anticoagulation. CAD: asymptomatic on low dose beta blocker with very active lifestyle.  Extensive native artery and graft disease with multiple stents placed in different coronary distribution and residual stenosis in the distal LAD artery beyond the bypass insertion.  Not on aspirin due to full anticoagulation AFib: Very low burden of atrial fibrillation (<1%), but has a documented episode as recently as February 3 of this year.  On anticoagulation,  CHA2DS2-VASc score 4. Anticoagulation: Well-tolerated, no bleeding problems. CRT-D: Implanted and followed by Dr. Cristopher Peru.  Checked all leads manually today and reset counters, first office visit since 2019.  Has never required VT/VF therapy other than the testing at the time of implantation.  Has excellent biventricular pacing.  Will reestablish in office follow-up on a yearly basis.  Lower rate limit for tachy arrhythmia therapy is 190 bpm.  He does not even approach this during intense activity. NSVT is very rare, none documented by his device since 2021.  HLP: Excellent parameters on PCSK9 inhibitor and Vascepa.  Has statin myopathy. GERD: Well-controlled on chronic PPI. OSA: Compliant with CPAP, denies hypersomnolence.           Medication Adjustments/Labs and Tests Ordered: Current medicines are reviewed at length with the patient today.  Concerns regarding medicines are outlined above.  Orders Placed This Encounter  Procedures   Pacemaker external - parameters   EKG 12-Lead   No orders of the defined types were placed in this encounter.   Patient Instructions  Medication Instructions:  No changes *If you need a refill on your cardiac medications before your next appointment, please  call your pharmacy*  Follow-Up: At [facility], you and your health needs are our priority.  As part of our continuing mission to provide you with exceptional heart care, we have created designated Provider Care Teams.  These Care Teams include your primary Cardiologist (physician) and Advanced Practice Providers (APPs -  Physician Assistants and Nurse Practitioners) who all work together to provide you with the care you need, when you need it.  We recommend signing up for the patient portal called "MyChart".  Sign up information is provided on this After Visit Summary.  MyChart is used to connect with patients for Virtual Visits (Telemedicine).  Patients are able to view lab/test  results, encounter notes, upcoming appointments, etc.  Non-urgent messages can be sent to your provider as well.   To learn more about what you can do with MyChart, go to [email or web address].    Your next appointment:   1 year(s)  Provider:   Dr Sallyanne Kuster     Signed, Sanda Klein, MD  02/19/2022 4:51 PM    [place]

## 2022-02-13 NOTE — Patient Instructions (Signed)
Medication Instructions:  No changes *If you need a refill on your cardiac medications before your next appointment, please call your pharmacy*  Follow-Up: At [facility], you and your health needs are our priority.  As part of our continuing mission to provide you with exceptional heart care, we have created designated Provider Care Teams.  These Care Teams include your primary Cardiologist (physician) and Advanced Practice Providers (APPs -  Physician Assistants and Nurse Practitioners) who all work together to provide you with the care you need, when you need it.  We recommend signing up for the patient portal called "MyChart".  Sign up information is provided on this After Visit Summary.  MyChart is used to connect with patients for Virtual Visits (Telemedicine).  Patients are able to view lab/test results, encounter notes, upcoming appointments, etc.  Non-urgent messages can be sent to your provider as well.   To learn more about what you can do with MyChart, go to [email or web address].    Your next appointment:   1 year(s)  Provider:   Dr Croitoru  

## 2022-02-19 ENCOUNTER — Encounter: Payer: Self-pay | Admitting: Cardiovascular Disease

## 2022-03-21 ENCOUNTER — Telehealth: Payer: Self-pay

## 2022-03-21 NOTE — Telephone Encounter (Signed)
**Note De-Identified Tommy [name] Obfuscation** Dapagliflozin PA started through covermymeds. Key: [id number]

## 2022-03-22 NOTE — Telephone Encounter (Signed)
**Note De-Identified Katelinn Justice Obfuscation** Rosalie Doctor (Key: [id number]) PA Case ID #: [id number] Outcome Approved on March 12 12-MAR-24:31-DEC-24 Dapagliflozin Propanediol '10MG'$  OR TABS Quantity:30; Drug Dapagliflozin Propanediol '10MG'$  tablets [place] [employer] Electronic Prior Authorization Form 2017 NCPDP  I have notified [id number](Ph: [phone] of this approval.

## 2022-05-19 ENCOUNTER — Telehealth: Payer: Self-pay | Admitting: Cardiology

## 2022-05-19 DIAGNOSIS — G4733 Obstructive sleep apnea (adult) (pediatric): Secondary | ICD-10-CM

## 2022-05-19 DIAGNOSIS — I5022 Chronic systolic (congestive) heart failure: Secondary | ICD-10-CM

## 2022-05-19 NOTE — Telephone Encounter (Signed)
Pt states he needs new sleep supplies ordered

## 2022-06-01 NOTE — Telephone Encounter (Signed)
Cpap supplies ordered with [facility] via community message.  Upon patient request DME selection is [facility] Patient understands he will be contacted by [facility] to set up his cpap. Patient understands to call if [facility] does not contact him with new setup in a timely manner. Patient understands they will be called once confirmation has been received from [place] that they have received their new machine to schedule 10 week follow up appointment.   [facility] notified of new cpap order  Please add to airview Patient was grateful for the call and thanked me

## 2022-06-09 ENCOUNTER — Encounter: Payer: Self-pay | Admitting: Cardiovascular Disease

## 2022-06-20 ENCOUNTER — Ambulatory Visit: Payer: HMO | Attending: Internal Medicine | Admitting: Internal Medicine

## 2022-06-20 ENCOUNTER — Encounter: Payer: Self-pay | Admitting: Internal Medicine

## 2022-06-20 VITALS — BP 104/62 | HR 59 | Ht 72.0 in | Wt 196.0 lb

## 2022-06-20 DIAGNOSIS — I5022 Chronic systolic (congestive) heart failure: Secondary | ICD-10-CM

## 2022-06-20 DIAGNOSIS — Z95810 Presence of automatic (implantable) cardiac defibrillator: Secondary | ICD-10-CM | POA: Diagnosis not present

## 2022-06-20 DIAGNOSIS — I480 Paroxysmal atrial fibrillation: Secondary | ICD-10-CM | POA: Diagnosis not present

## 2022-06-20 DIAGNOSIS — I25709 Atherosclerosis of coronary artery bypass graft(s), unspecified, with unspecified angina pectoris: Secondary | ICD-10-CM

## 2022-06-20 LAB — CUP PACEART INCLINIC DEVICE CHECK
Date Time Interrogation Session: 20240611165640
HighPow Impedance: 80 Ohm
Implantable Lead Connection Status: 753985
Implantable Lead Connection Status: 753985
Implantable Lead Connection Status: 753985
Implantable Lead Implant Date: 20180703
Implantable Lead Implant Date: 20180703
Implantable Lead Implant Date: 20180703
Implantable Lead Location: 753858
Implantable Lead Location: 753859
Implantable Lead Location: 753860
Implantable Lead Model: 293
Implantable Lead Model: 4674
Implantable Lead Model: 7741
Implantable Lead Serial Number: 433305
Implantable Lead Serial Number: 801469
Implantable Lead Serial Number: 901025
Implantable Pulse Generator Implant Date: 20180703
Lead Channel Impedance Value: 383 Ohm
Lead Channel Impedance Value: 588 Ohm
Lead Channel Impedance Value: 602 Ohm
Lead Channel Pacing Threshold Amplitude: 0.5 V
Lead Channel Pacing Threshold Amplitude: 1 V
Lead Channel Pacing Threshold Amplitude: 1.2 V
Lead Channel Pacing Threshold Pulse Width: 0.4 ms
Lead Channel Pacing Threshold Pulse Width: 0.4 ms
Lead Channel Pacing Threshold Pulse Width: 0.4 ms
Lead Channel Sensing Intrinsic Amplitude: 22.8 mV
Lead Channel Sensing Intrinsic Amplitude: 25 mV
Lead Channel Sensing Intrinsic Amplitude: 5.4 mV
Lead Channel Setting Pacing Amplitude: 1.5 V
Lead Channel Setting Pacing Amplitude: 2 V
Lead Channel Setting Pacing Amplitude: 2 V
Lead Channel Setting Pacing Pulse Width: 0.4 ms
Lead Channel Setting Pacing Pulse Width: 0.4 ms
Lead Channel Setting Sensing Sensitivity: 0.6 mV
Lead Channel Setting Sensing Sensitivity: 1 mV
Pulse Gen Serial Number: 169965

## 2022-06-20 NOTE — Progress Notes (Signed)
HPI Tommy [name] returns today for followup. He has extensive CAD, s/p remote MI, severe LV dysfunction, LBBB, s/p biv ICD insertion. He has done well in the interim. No chest pain or wosening sob. No ICD shocks. No palpitations. His EF normalized. He is still running 25 miles a week. Allergies  Allergen Reactions   Fish Oil Other (See Comments)    FEELS BAD      Current Outpatient Medications  Medication Sig Dispense Refill   Alirocumab (PRALUENT) 75 MG/ML SOAJ INJECT 1 PEN INTO THE SKIN EVERY 14 (FOURTEEN) DAYS. 6 mL 3   cholecalciferol (VITAMIN D3) 25 MCG (1000 UT) tablet Take 1,000 Units by mouth daily.     dapagliflozin propanediol (FARXIGA) 10 MG TABS tablet Take 1 tablet (10 mg total) by mouth daily before breakfast. 30 tablet 11   ELIQUIS 5 MG TABS tablet TAKE 1 TABLET BY MOUTH TWICE A DAY 60 tablet 5   ergocalciferol (VITAMIN D2) 1.25 MG (50000 UT) capsule Take 1 capsule by mouth daily.     finasteride (PROPECIA) 1 MG tablet Take 1 mg by mouth daily.     icosapent Ethyl (VASCEPA) 1 g capsule TAKE 2 CAPSULES BY MOUTH 2 TIMES DAILY. 360 capsule 2   metoprolol succinate (TOPROL-XL) 25 MG 24 hr tablet TAKE 1 TABLET BY MOUTH EVERY DAY WITH OR IMMEDIATELY FOLLOWING A MEAL 90 tablet 2   omeprazole (PRILOSEC) 40 MG capsule TAKE 1 CAPSULE BY MOUTH EVERY DAY 90 capsule 2   prednisoLONE acetate (PRED FORTE) 1 % ophthalmic suspension Place 1 drop into the left eye every other day.     sacubitril-valsartan (ENTRESTO) 49-51 MG Take 1 tablet by mouth 2 (two) times daily. 180 tablet 2   sodium chloride (MURO 128) 2 % ophthalmic solution Place 1 drop into the left eye 3 (three) times daily.     tadalafil (CIALIS) 20 MG tablet Take by mouth as needed. (Patient not taking: Reported on 02/13/2022)     No current facility-administered medications for this visit.     Past Medical History:  Diagnosis Date   AICD (automatic cardioverter/defibrillator) present    BIV   Anginal pain (HCC)     Blood transfusion without reported diagnosis    CAD (coronary artery disease)    with CABG LIMA to LAD, free radial PDA, SVG to diagonal, SVG to OM. 2002. DES May 2011 and 01-2010 to SVG of Diag, native OM, and Native RCA. Unable to stent LAD via LIMA10/18 PCI to dRCA, multi-site DES to SVG--> OM   GERD (gastroesophageal reflux disease)    Hyperlipidemia    unable to tolarate statin therapy   Hypertension    Kidney stone    OSA on CPAP 12/13/2017   Severe obstructive sleep apnea with an AHI of 30.3/h and no significant central sleep apnea.  His oxygen saturations dropped to 83%.  He is on CPAP at 8 cm H2O.   Sleep apnea    Vertigo     ROS:   All systems reviewed and negative except as noted in the HPI.   Past Surgical History:  Procedure Laterality Date   BIV ICD INSERTION CRT-D N/A 07/11/2016   Procedure: BiV ICD Insertion CRT-D;  Surgeon: Marinus Maw, MD;  Location: [facility] INVASIVE CV LAB;  Service: Cardiovascular;  Laterality: N/A;   CHOLECYSTECTOMY     COLONOSCOPY     CORONARY ARTERY BYPASS GRAFT     CORONARY BALLOON ANGIOPLASTY N/A 10/12/2016   Procedure: CORONARY  BALLOON ANGIOPLASTY;  Surgeon: Lyn Records, MD;  Location: [facility] INVASIVE CV LAB;  Service: Cardiovascular;  Laterality: N/A;   CORONARY STENT INTERVENTION  10/12/2016   PTCA of focal in-stent restenosis in the second obtuse marginal reducing 90% stenosis to 0%.   CORONARY STENT INTERVENTION N/A 10/12/2016   Procedure: CORONARY STENT INTERVENTION;  Surgeon: Lyn Records, MD;  Location: [facility] INVASIVE CV LAB;  Service: Cardiovascular;  Laterality: N/A;   LEFT HEART CATH AND CORS/GRAFTS ANGIOGRAPHY N/A 10/12/2016   Procedure: LEFT HEART CATH AND CORS/GRAFTS ANGIOGRAPHY;  Surgeon: Lyn Records, MD;  Location: [facility] INVASIVE CV LAB;  Service: Cardiovascular;  Laterality: N/A;   LEFT HEART CATH AND CORS/GRAFTS ANGIOGRAPHY N/A 01/08/2018   Procedure: LEFT HEART CATH AND CORS/GRAFTS ANGIOGRAPHY;  Surgeon: Lyn Records, MD;   Location: [facility] INVASIVE CV LAB;  Service: Cardiovascular;  Laterality: N/A;   RIGHT/LEFT HEART CATH AND CORONARY/GRAFT ANGIOGRAPHY N/A 03/01/2016   Procedure: Right/Left Heart Cath and Coronary/Graft Angiography;  Surgeon: Lyn Records, MD;  Location: [facility] INVASIVE CV LAB;  Service: Cardiovascular;  Laterality: N/A;   ULTRASOUND GUIDANCE FOR VASCULAR ACCESS  01/08/2018   Procedure: Ultrasound Guidance For Vascular Access;  Surgeon: Lyn Records, MD;  Location: [facility] INVASIVE CV LAB;  Service: Cardiovascular;;     Family History  Problem Relation Age of Onset   Colon cancer Father    Sudden death Neg Hx    Hypertension Neg Hx    Hyperlipidemia Neg Hx    Heart attack Neg Hx    Diabetes Neg Hx    Rectal cancer Neg Hx    Stomach cancer Neg Hx    Esophageal cancer Neg Hx    Ulcerative colitis Neg Hx      Social History   Socioeconomic History   Marital status: Married    Spouse name: Not on file   Number of children: Not on file   Years of education: Not on file   Highest education level: Not on file  Occupational History   Not on file  Tobacco Use   Smoking status: Never   Smokeless tobacco: Never  Vaping Use   Vaping Use: Never used  Substance and Sexual Activity   Alcohol use: Yes    Alcohol/week: 1.0 standard drink of alcohol    Types: 1 Glasses of wine per week    Comment: RARE   Drug use: No   Sexual activity: Not on file  Other Topics Concern   Not on file  Social History Narrative   Not on file   Social Determinants of Health   Financial Resource Strain: Not on file  Food Insecurity: Not on file  Transportation Needs: Not on file  Physical Activity: Not on file  Stress: Not on file  Social Connections: Not on file  Intimate Partner Violence: Not on file     BP 104/62   Pulse (!) 59   Ht 6' (1.829 m)   Wt 196 lb (88.9 kg)   SpO2 97%   BMI 26.58 kg/m   Physical Exam:  Well appearing NAD HEENT: Unremarkable Neck:  No JVD, no  thyromegally Lymphatics:  No adenopathy Back:  No CVA tenderness Lungs:  Clear with no wheezes HEART:  Regular rate rhythm, no murmurs, no rubs, no clicks Abd:  soft, positive bowel sounds, no organomegally, no rebound, no guarding Ext:  2 plus pulses, no edema, no cyanosis, no clubbing Skin:  No rashes no nodules Neuro:  CN II through XII intact,  motor grossly intact  EKG - NSR with biv pacing  DEVICE  Normal device function.  See PaceArt for details.   Assess/Plan:  1. CAD - he remains active exercising almost every day and he has had not had any angina. 2. PAF - he has been asymptomatic. He has been started on systemic anti-coagulation and no bleeding. 3. Dyslipidemia - he has been intolerant to statins but has been able to tolerate his Alirocumab.  4. ICD - his [place] Biv ICD is working normally. We will recheck in several months.    Leonia Reeves.D

## 2022-06-20 NOTE — Patient Instructions (Signed)
Medication Instructions:  Your physician recommends that you continue on your current medications as directed. Please refer to the Current Medication list given to you today.  *If you need a refill on your cardiac medications before your next appointment, please call your pharmacy*  Lab Work: None ordered.  If you have labs (blood work) drawn today and your tests are completely normal, you will receive your results only by: MyChart Message (if you have MyChart) OR A paper copy in the mail If you have any lab test that is abnormal or we need to change your treatment, we will call you to review the results.  Testing/Procedures: None ordered.  Follow-Up: At [facility], you and your health needs are our priority.  As part of our continuing mission to provide you with exceptional heart care, we have created designated Provider Care Teams.  These Care Teams include your primary Cardiologist (physician) and Advanced Practice Providers (APPs -  Physician Assistants and Nurse Practitioners) who all work together to provide you with the care you need, when you need it.  Your next appointment:   1 year(s)  The format for your next appointment:   In Person  Provider:   Lewayne Bunting, MD{or one of the following Advanced Practice Providers on your designated Care Team:   Francis Dowse, [place] Casimiro Needle "Mardelle Matte" Lanna Poche, [place]  Remote monitoring is used to monitor your ICD from home. This monitoring reduces the number of office visits required to check your device to one time per year. It allows [place] to keep an eye on the functioning of your device to ensure it is working properly. You are scheduled for a device check from home on 7/11. You may send your transmission at any time that day. If you have a wireless device, the transmission will be sent automatically. After your physician reviews your transmission, you will receive a postcard with your next transmission date.

## 2022-06-26 ENCOUNTER — Other Ambulatory Visit: Payer: Self-pay | Admitting: *Deleted

## 2022-06-26 ENCOUNTER — Telehealth: Payer: Self-pay | Admitting: Pharmacist

## 2022-06-26 MED ORDER — APIXABAN 5 MG PO TABS: 5.0000 mg | ORAL_TABLET | Freq: Two times a day (BID) | ORAL | 5 refills | Status: DC

## 2022-06-26 MED ORDER — METOPROLOL SUCCINATE ER 25 MG PO TB24: ORAL_TABLET | ORAL | 3 refills | Status: DC

## 2022-06-26 NOTE — Telephone Encounter (Signed)
Refill for Eliquis sent to [facility] - received request via fax.

## 2022-07-20 ENCOUNTER — Ambulatory Visit (INDEPENDENT_AMBULATORY_CARE_PROVIDER_SITE_OTHER): Payer: HMO

## 2022-07-20 DIAGNOSIS — I5022 Chronic systolic (congestive) heart failure: Secondary | ICD-10-CM

## 2022-07-20 LAB — CUP PACEART REMOTE DEVICE CHECK
Battery Remaining Longevity: 90 mo
Battery Remaining Percentage: 98 %
Brady Statistic RA Percent Paced: 3 %
Brady Statistic RV Percent Paced: 12 %
Date Time Interrogation Session: 20240711044100
HighPow Impedance: 77 Ohm
Implantable Lead Connection Status: 753985
Implantable Lead Connection Status: 753985
Implantable Lead Connection Status: 753985
Implantable Lead Implant Date: 20180703
Implantable Lead Implant Date: 20180703
Implantable Lead Implant Date: 20180703
Implantable Lead Location: 753858
Implantable Lead Location: 753859
Implantable Lead Location: 753860
Implantable Lead Model: 293
Implantable Lead Model: 4674
Implantable Lead Model: 7741
Implantable Lead Serial Number: 433305
Implantable Lead Serial Number: 801469
Implantable Lead Serial Number: 901025
Implantable Pulse Generator Implant Date: 20180703
Lead Channel Impedance Value: 378 Ohm
Lead Channel Impedance Value: 593 Ohm
Lead Channel Impedance Value: 604 Ohm
Lead Channel Setting Pacing Amplitude: 1.5 V
Lead Channel Setting Pacing Amplitude: 2 V
Lead Channel Setting Pacing Amplitude: 2 V
Lead Channel Setting Pacing Pulse Width: 0.4 ms
Lead Channel Setting Pacing Pulse Width: 0.4 ms
Lead Channel Setting Sensing Sensitivity: 0.6 mV
Lead Channel Setting Sensing Sensitivity: 1 mV
Pulse Gen Serial Number: 169965

## 2022-08-07 NOTE — Progress Notes (Signed)
Remote ICD transmission.   

## 2022-10-02 ENCOUNTER — Other Ambulatory Visit: Payer: Self-pay | Admitting: *Deleted

## 2022-10-02 MED ORDER — DAPAGLIFLOZIN PROPANEDIOL 10 MG PO TABS: 10.0000 mg | ORAL_TABLET | Freq: Every day | ORAL | 11 refills | Status: DC

## 2022-10-19 ENCOUNTER — Ambulatory Visit (INDEPENDENT_AMBULATORY_CARE_PROVIDER_SITE_OTHER): Payer: Medicare Other

## 2022-10-19 DIAGNOSIS — I5022 Chronic systolic (congestive) heart failure: Secondary | ICD-10-CM | POA: Diagnosis not present

## 2022-10-19 LAB — CUP PACEART REMOTE DEVICE CHECK
Battery Remaining Longevity: 84 mo
Battery Remaining Percentage: 96 %
Brady Statistic RA Percent Paced: 2 %
Brady Statistic RV Percent Paced: 10 %
Date Time Interrogation Session: 20241010044300
HighPow Impedance: 78 Ohm
Implantable Lead Connection Status: 753985
Implantable Lead Connection Status: 753985
Implantable Lead Connection Status: 753985
Implantable Lead Implant Date: 20180703
Implantable Lead Implant Date: 20180703
Implantable Lead Implant Date: 20180703
Implantable Lead Location: 753858
Implantable Lead Location: 753859
Implantable Lead Location: 753860
Implantable Lead Model: 293
Implantable Lead Model: 4674
Implantable Lead Model: 7741
Implantable Lead Serial Number: 433305
Implantable Lead Serial Number: 801469
Implantable Lead Serial Number: 901025
Implantable Pulse Generator Implant Date: 20180703
Lead Channel Impedance Value: 406 Ohm
Lead Channel Impedance Value: 592 Ohm
Lead Channel Impedance Value: 608 Ohm
Lead Channel Setting Pacing Amplitude: 1.5 V
Lead Channel Setting Pacing Amplitude: 2 V
Lead Channel Setting Pacing Amplitude: 2 V
Lead Channel Setting Pacing Pulse Width: 0.4 ms
Lead Channel Setting Pacing Pulse Width: 0.4 ms
Lead Channel Setting Sensing Sensitivity: 0.6 mV
Lead Channel Setting Sensing Sensitivity: 1 mV
Pulse Gen Serial Number: 169965

## 2022-10-22 ENCOUNTER — Other Ambulatory Visit: Payer: Self-pay | Admitting: Cardiovascular Disease

## 2022-10-31 NOTE — Progress Notes (Signed)
Remote ICD transmission.   

## 2022-11-08 ENCOUNTER — Other Ambulatory Visit: Payer: Self-pay | Admitting: Cardiovascular Disease

## 2022-12-09 ENCOUNTER — Other Ambulatory Visit: Payer: Self-pay | Admitting: Cardiovascular Disease

## 2022-12-10 NOTE — Telephone Encounter (Signed)
Prescription refill request for Eliquis received. Indication: a fib Last office visit: 06/20/22 Scr: 1.12 care everywhere 10/05/22 Age: 72 Weight: 88kg

## 2022-12-13 ENCOUNTER — Telehealth: Payer: Self-pay | Admitting: Cardiovascular Disease

## 2022-12-13 ENCOUNTER — Other Ambulatory Visit: Payer: Self-pay

## 2022-12-13 MED ORDER — APIXABAN 5 MG PO TABS: 5.0000 mg | ORAL_TABLET | Freq: Two times a day (BID) | ORAL | 0 refills | Status: AC

## 2022-12-13 NOTE — Telephone Encounter (Signed)
Pt called in stating he needs refill on Eliquis. It has been sent but when I called pharmacy they stated it is too early to pick up. He cannot pick up till 15th-16th. Pt states he must have misplaced it when he went out of town. Please advise on how he can get enough pills to last until he can pick up again.

## 2022-12-13 NOTE — Telephone Encounter (Signed)
Spoke with Pt. Pt states he left his eliquis where he was staying out of town last week. Called pharmacy and they stated they could not over ride the insurance for early refill. 14 tablets to last him a week would be $153 with a discount card.  Gave 1 box of samples (Oked by supervisor). Pt was grateful. Samples at front.

## 2022-12-20 ENCOUNTER — Other Ambulatory Visit: Payer: Self-pay

## 2022-12-20 MED ORDER — ENTRESTO 49-51 MG PO TABS: 1.0000 | ORAL_TABLET | Freq: Two times a day (BID) | ORAL | 1 refills | Status: DC

## 2022-12-25 ENCOUNTER — Telehealth: Payer: Self-pay | Admitting: Cardiovascular Disease

## 2022-12-25 MED ORDER — PRALUENT 75 MG/ML ~~LOC~~ SOAJ: 75.0000 mg | SUBCUTANEOUS | 3 refills | Status: DC

## 2022-12-25 NOTE — Telephone Encounter (Signed)
*  STAT* If patient is at the pharmacy, call can be transferred to refill team.   1. Which medications need to be refilled? (please list name of each medication and dose if known) Alirocumab (PRALUENT) 75 MG/ML SOAJ   2. Which pharmacy/location (including street and city if local pharmacy) is medication to be sent to?  [id number] [place],  - [address]    3. Do they need a 30 day or 90 day supply? 90

## 2023-01-18 ENCOUNTER — Ambulatory Visit (INDEPENDENT_AMBULATORY_CARE_PROVIDER_SITE_OTHER): Payer: Medicare Other

## 2023-01-18 DIAGNOSIS — I255 Ischemic cardiomyopathy: Secondary | ICD-10-CM | POA: Diagnosis not present

## 2023-01-20 LAB — CUP PACEART REMOTE DEVICE CHECK
Battery Remaining Longevity: 78 mo
Battery Remaining Percentage: 92 %
Brady Statistic RA Percent Paced: 2 %
Brady Statistic RV Percent Paced: 11 %
Date Time Interrogation Session: 20250110161900
HighPow Impedance: 80 Ohm
Lead Channel Impedance Value: 445 Ohm
Lead Channel Impedance Value: 578 Ohm
Lead Channel Impedance Value: 600 Ohm
Lead Channel Setting Pacing Amplitude: 1.5 V
Lead Channel Setting Pacing Amplitude: 2 V
Lead Channel Setting Pacing Amplitude: 2 V
Lead Channel Setting Pacing Pulse Width: 0.4 ms
Lead Channel Setting Pacing Pulse Width: 0.4 ms
Lead Channel Setting Sensing Sensitivity: 0.6 mV
Lead Channel Setting Sensing Sensitivity: 1 mV
Pulse Gen Serial Number: 169965

## 2023-02-14 ENCOUNTER — Ambulatory Visit: Payer: HMO | Attending: Cardiovascular Disease | Admitting: Cardiovascular Disease

## 2023-02-14 ENCOUNTER — Encounter: Payer: Self-pay | Admitting: Cardiovascular Disease

## 2023-02-14 VITALS — BP 120/80 | HR 72 | Ht 72.0 in | Wt 200.0 lb

## 2023-02-14 DIAGNOSIS — Z95810 Presence of automatic (implantable) cardiac defibrillator: Secondary | ICD-10-CM

## 2023-02-14 DIAGNOSIS — G4733 Obstructive sleep apnea (adult) (pediatric): Secondary | ICD-10-CM

## 2023-02-14 DIAGNOSIS — I5042 Chronic combined systolic (congestive) and diastolic (congestive) heart failure: Secondary | ICD-10-CM

## 2023-02-14 DIAGNOSIS — I25708 Atherosclerosis of coronary artery bypass graft(s), unspecified, with other forms of angina pectoris: Secondary | ICD-10-CM

## 2023-02-14 DIAGNOSIS — D6869 Other thrombophilia: Secondary | ICD-10-CM

## 2023-02-14 DIAGNOSIS — E7800 Pure hypercholesterolemia, unspecified: Secondary | ICD-10-CM

## 2023-02-14 DIAGNOSIS — I38 Endocarditis, valve unspecified: Secondary | ICD-10-CM | POA: Diagnosis not present

## 2023-02-14 DIAGNOSIS — I480 Paroxysmal atrial fibrillation: Secondary | ICD-10-CM

## 2023-02-14 NOTE — Patient Instructions (Signed)
 Medication Instructions:  No changes today. *If you need a refill on your cardiac medications before your next appointment, please call your pharmacy*   Lab Work: Fasting Lipid , CBC, CMET anytime. If you have labs (blood work) drawn today and your tests are completely normal, you will receive your results only by: MyChart Message (if you have MyChart) OR A paper copy in the mail If you have any lab test that is abnormal or we need to change your treatment, we will call you to review the results.   Follow-Up: At [facility], you and your health needs are our priority.  As part of our continuing mission to provide you with exceptional heart care, we have created designated Provider Care Teams.  These Care Teams include your primary Cardiologist (physician) and Advanced Practice Providers (APPs -  Physician Assistants and Nurse Practitioners) who all work together to provide you with the care you need, when you need it.  We recommend signing up for the patient portal called MyChart.  Sign up information is provided on this After Visit Summary.  MyChart is used to connect with patients for Virtual Visits (Telemedicine).  Patients are able to view lab/test results, encounter notes, upcoming appointments, etc.  Non-urgent messages can be sent to your provider as well.   To learn more about what you can do with MyChart, go to [email or web address].    Your next appointment:   1 year(s)  Provider:   Jerel Balding, MD     Other Instructions

## 2023-02-14 NOTE — Progress Notes (Signed)
 Cardiology Office Note:    Date:  02/14/2023   ID:  Tommy [name], DOB [birth year], MRN [id number]  PCP:  Patient, No Pcp Per   [facility] Providers Cardiologist:  Jerel Balding, MD     Referring MD: No ref. provider found   Chief Complaint  Patient presents with   Congestive Heart Failure    History of Present Illness:    Tommy [name] is a 73 y.o. male with a hx of CAD s/p CABG 2002 with bypass graft failure and subsequent PCI (last PCI 2018, last cath 2019), mild ischemic CMP (recovered to EF 50-55% by echo 01/17/2022), s/p CRT-D [facility] 2018, Dr. 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  He had CABG at age 5 in 2002, started receiving multiple stents to native RCA, native LCX-OM1 and SVG-diagonal in 2011-2012 and then again in 2018.  In 2018 his EF dropped to 20-25%.  He underwent stenting of the SVG to the diagonal artery.  He started treatment with Entresto .  He received a biventricular pacemaker defibrillator.  MRI showed little scar and a disproportionate reduction LVEF.  He subsequently had marked recovery of LV function, almost back to normal (EF 50-55% by January 2024 echo).  He is always been very athletic and has run numerous marathons in the past.  He plays doubles tennis 4 days a week and the other 3 days a week he will run 5 or 6 miles on an indoor track, roughly 11-minute mile pace.  Then in the evenings he will go for a walk with his wife.  He has not had any problems with chest pain or shortness of breath during these activities.  He does not have edema or claudication, dizziness or syncope, palpitations or defibrillator discharges.  He has not had any falls or bleeding complications on Eliquis .  His most recent metabolic panel is rather dated from September 2023, but was excellent.  We ordered labs again today  I reviewed his most recent remote ICD download.  Estimated  gentle longevity 6.5 years and all lead parameters are excellent.  His lower rate limit is programmed to 50 bpm and he only has 2% atrial pacing.  The heart rate histogram is excellent.  He has 97% left ventricular pacing.  He has had some atrial fibrillation with 1 episode lasting for over 9 hours, but the overall burden of atrial fibrillation remains quite low at well under 1% (a total of 1.2 days in the last 6 months).  He has not had any ventricular tachycardia.  His heart failure score is 2 and has been very stable.  Most recent PCI in October 2018: - POBA in-stent restenosis OM1 - POBA in-stent restenosis mid RCA - de novo stents SVG-Diagonal (proximal 3.5 x 26 Onyx; distal anastomosis 2.5 x 12 Onyx) - de novo stent distal RCA (3.5 x 18 Onyx)  His most recent cardiac catheterization 2019 shows advanced native and graft disease, summarized as follows: - Occluded proximal LAD, patent LIMA to LAD, 95% stenosis in distal LAD beyond bypass (not amenable to PCI due to tortuosity of bypass); occluded proximal diagonal artery with patent SVG to diagonal (stents in this vessel are the most recent PCI placed in 2018) - Patent stent in the proximal OM1 vessel with 30% in-stent restenosis, occluded SVG to OM -  Full metal jacket of the proximal native right coronary artery with a 60% stenosis in the middle portion  of this stented vessel,  nondilatable by previous attempts.  The SVG to RCA is occluded.  In 2018 he underwent cardiac MRI that showed only very limited scar in the basal-mid septum and anterior wall with a disproportionate reduction in LVEF at 29%.  He subsequently had multiple PCI and CRT-D implantation.  This is his first in office device check since January 08, 2018.  He has been doing remote downloads every 3 months and these have been reviewed by Dr. Waddell.    Past Medical History:  Diagnosis Date   AICD (automatic cardioverter/defibrillator) present    BIV   Anginal pain (HCC)     Blood transfusion without reported diagnosis    CAD (coronary artery disease)    with CABG LIMA to LAD, free radial PDA, SVG to diagonal, SVG to OM. 2002. DES May 2011 and 01-2010 to SVG of Diag, native OM, and Native RCA. Unable to stent LAD via LIMA10/18 PCI to dRCA, multi-site DES to SVG--> OM   GERD (gastroesophageal reflux disease)    Hyperlipidemia    unable to tolarate statin therapy   Hypertension    Kidney stone    OSA on CPAP 12/13/2017   Severe obstructive sleep apnea with an AHI of 30.3/h and no significant central sleep apnea.  His oxygen saturations dropped to 83%.  He is on CPAP at 8 cm H2O.   Sleep apnea    Vertigo     Past Surgical History:  Procedure Laterality Date   BIV ICD INSERTION CRT-D N/A 07/11/2016   Procedure: BiV ICD Insertion CRT-D;  Surgeon: Waddell Danelle ORN, MD;  Location: [facility] INVASIVE CV LAB;  Service: Cardiovascular;  Laterality: N/A;   CHOLECYSTECTOMY     COLONOSCOPY     CORONARY ARTERY BYPASS GRAFT     CORONARY BALLOON ANGIOPLASTY N/A 10/12/2016   Procedure: CORONARY BALLOON ANGIOPLASTY;  Surgeon: Claudene Victory ORN, MD;  Location: [facility] INVASIVE CV LAB;  Service: Cardiovascular;  Laterality: N/A;   CORONARY STENT INTERVENTION  10/12/2016   PTCA of focal in-stent restenosis in the second obtuse marginal reducing 90% stenosis to 0%.   CORONARY STENT INTERVENTION N/A 10/12/2016   Procedure: CORONARY STENT INTERVENTION;  Surgeon: Claudene Victory ORN, MD;  Location: [facility] INVASIVE CV LAB;  Service: Cardiovascular;  Laterality: N/A;   LEFT HEART CATH AND CORS/GRAFTS ANGIOGRAPHY N/A 10/12/2016   Procedure: LEFT HEART CATH AND CORS/GRAFTS ANGIOGRAPHY;  Surgeon: Claudene Victory ORN, MD;  Location: [facility] INVASIVE CV LAB;  Service: Cardiovascular;  Laterality: N/A;   LEFT HEART CATH AND CORS/GRAFTS ANGIOGRAPHY N/A 01/08/2018   Procedure: LEFT HEART CATH AND CORS/GRAFTS ANGIOGRAPHY;  Surgeon: Claudene Victory ORN, MD;  Location: [facility] INVASIVE CV LAB;  Service: Cardiovascular;  Laterality: N/A;    RIGHT/LEFT HEART CATH AND CORONARY/GRAFT ANGIOGRAPHY N/A 03/01/2016   Procedure: Right/Left Heart Cath and Coronary/Graft Angiography;  Surgeon: Victory ORN Claudene, MD;  Location: [facility] INVASIVE CV LAB;  Service: Cardiovascular;  Laterality: N/A;   ULTRASOUND GUIDANCE FOR VASCULAR ACCESS  01/08/2018   Procedure: Ultrasound Guidance For Vascular Access;  Surgeon: Claudene Victory ORN, MD;  Location: [facility] INVASIVE CV LAB;  Service: Cardiovascular;;    Current Medications: Current Meds  Medication Sig   Alirocumab  (PRALUENT ) 75 MG/ML SOAJ Inject 1 mL (75 mg total) into the skin every 14 (fourteen) days.   apixaban  (ELIQUIS ) 5 MG TABS tablet Take 1 tablet (5 mg total) by mouth 2 (two) times daily.   cholecalciferol  (VITAMIN D3) 25 MCG (1000 UT) tablet Take 1,000 Units by  mouth daily.   dapagliflozin  propanediol (FARXIGA ) 10 MG TABS tablet Take 1 tablet (10 mg total) by mouth daily before breakfast.   ELIQUIS  5 MG TABS tablet TAKE 1 TABLET BY MOUTH TWICE A DAY   ergocalciferol  (VITAMIN D2) 1.25 MG (50000 UT) capsule Take 1 capsule by mouth daily.   finasteride  (PROPECIA ) 1 MG tablet Take 1 mg by mouth daily.   icosapent  Ethyl (VASCEPA ) 1 g capsule TAKE 2 CAPSULES BY MOUTH TWICE A DAY   metoprolol  succinate (TOPROL -XL) 25 MG 24 hr tablet TAKE 1 TABLET BY MOUTH EVERY DAY WITH OR IMMEDIATELY FOLLOWING A MEAL   omeprazole  (PRILOSEC) 40 MG capsule TAKE 1 CAPSULE BY MOUTH EVERY DAY   prednisoLONE acetate (PRED FORTE) 1 % ophthalmic suspension Place 1 drop into the left eye every other day.   sacubitril -valsartan  (ENTRESTO ) 49-51 MG Take 1 tablet by mouth 2 (two) times daily.   sodium chloride  (MURO 128) 2 % ophthalmic solution Place 1 drop into the left eye 3 (three) times daily.   tadalafil (CIALIS) 20 MG tablet Take by mouth as needed.     Allergies:   Fish oil    Social History   Socioeconomic History   Marital status: Married    Spouse name: Not on file   Number of children: Not on file   Years of education:  Not on file   Highest education level: Not on file  Occupational History   Not on file  Tobacco Use   Smoking status: Never   Smokeless tobacco: Never  Vaping Use   Vaping status: Never Used  Substance and Sexual Activity   Alcohol use: Yes    Alcohol/week: 1.0 standard drink of alcohol    Types: 1 Glasses of wine per week    Comment: RARE   Drug use: No   Sexual activity: Not on file  Other Topics Concern   Not on file  Social History Narrative   Not on file   Social Drivers of Health   Financial Resource Strain: Not on file  Food Insecurity: Low Risk  (08/16/2022)   Received from [facility]   Hunger Vital Sign    Worried About Running Out of Food in the Last Year: Never true    Ran Out of Food in the Last Year: Never true  Transportation Needs: No Transportation Needs (08/16/2022)   Received from [facility]    In the past 12 months, has lack of reliable transportation kept you from medical appointments, meetings, work or from getting things needed for daily living? : No  Physical Activity: Not on file  Stress: Not on file  Social Connections: Not on file     Family History: The patient's family history includes Colon cancer in his father. There is no history of Sudden death, Hypertension, Hyperlipidemia, Heart attack, Diabetes, Rectal cancer, Stomach cancer, Esophageal cancer, or Ulcerative colitis.  ROS:   Please see the history of present illness.     All other systems reviewed and are negative.  EKGs/Labs/Other Studies Reviewed:    The following studies were reviewed today: Cardiac MRI in 2018 1) Moderate LVE with diffuse hypokinesis and marked dyssynergy between the septum and lateral walls. EF 29% patient would appear to benefit from biventricular pacing 2) Small area of subendocardial scar in the mid and basal septum and anterior wall 3) Mild MR 4) Mild LAE 5) Normal RV    Cardiac catheterization 01/08/2018 Bypass graft occlusive  disease with total occlusion of the  free radial to RCA, and SVG to OM. Patent LIMA to LAD with high-grade obstruction distal to the LIMA insertion site.  Not significantly changed compared to October 2018.  I am concerned about the stenosis in the distal LAD but feel that delivery of the stent would not be possible because of tortuosity and distance from guide catheter. Widely patent saphenous vein graft to the large first diagonal with widely patent stents placed in October 2018.   Native right coronary patent with eccentric 60% narrowing in the mid vessel at site of gap in what is otherwise a full metal jacket.  This was non-dilatable up to 20 atm in October 2018.  The newly placed stent in the distal RCA is widely patent.  I am concerned about the mid vessel stenosis.  If we are unable to control angina with medication may consider shockwave or laser for the non-dilatable lesion. Circumflex is diffusely diseased.  The large first obtuse marginal which was restented in 2018 remains widely patent but with diffuse 30% ISR in the newly placed stent. Patent left main. LV function was poorly assessed but there is normal anterior wall motion.  Estimated ejection fraction greater than 35 to 40%.  LVEDP was normal. Diagnostic Dominance: Right    Echocardiogram 01/17/2022   1. Left ventricular ejection fraction, by estimation, is 50 to 55%. The  left ventricle has low normal function. The left ventricle demonstrates  regional wall motion abnormalities (see scoring diagram/findings for  description). Left ventricular diastolic   parameters are consistent with Grade I diastolic dysfunction (impaired  relaxation).   2. Right ventricular systolic function is normal. The right ventricular  size is normal. There is normal pulmonary artery systolic pressure. The  estimated right ventricular systolic pressure is 22.7 mmHg.   3. The mitral valve is normal in structure. Mild mitral valve  regurgitation. No  evidence of mitral stenosis.   4. The aortic valve is tricuspid. Aortic valve regurgitation is not  visualized. No aortic stenosis is present.   5. The inferior vena cava is normal in size with greater than 50%  respiratory variability, suggesting right atrial pressure of 3 mmHg.   LV Wall Scoring:  The basal inferolateral segment and basal inferior segment are akinetic.    EKG:    EKG Interpretation Date/Time:  Wednesday February 14 2023 08:22:33 EST Ventricular Rate:  72 PR Interval:  120 QRS Duration:  190 QT Interval:  472 QTC Calculation: 516 R Axis:   259  Text Interpretation: Atrial-sensed ventricular-paced rhythm When compared with ECG of 10-Oct-2018 14:29, Vent. rate has decreased BY   5 BPM Confirmed by [place] Festa ([id number]) on 02/14/2023 8:28:29 AM         Recent Labs: No results found for requested labs within last 365 days.  Recent Lipid Panel    Component Value Date/Time   CHOL 123 09/27/2021 0924   TRIG 105 09/27/2021 0924   HDL 48 09/27/2021 0924   CHOLHDL 2.6 09/27/2021 0924   CHOLHDL 3.3 03/03/2015 0744   VLDL 22 03/03/2015 0744   LDLCALC 56 09/27/2021 0924   LDLDIRECT 81 10/24/2016 1119   LDLDIRECT 91.0 02/05/2014 0809     Risk Assessment/Calculations:    CHA2DS2-VASc Score = 4   This indicates a 4.8% annual risk of stroke. The patient's score is based upon: CHF History: 1 HTN History: 1 Diabetes History: 0 Stroke History: 0 Vascular Disease History: 1 Age Score: 1 Gender Score: 0  Physical Exam:    VS:  BP 120/80 (BP Location: Left Arm, Patient Position: Sitting)   Pulse 72   Ht 6' (1.829 m)   Wt 200 lb (90.7 kg)   SpO2 95%   BMI 27.12 kg/m     Wt Readings from Last 3 Encounters:  02/14/23 200 lb (90.7 kg)  06/20/22 196 lb (88.9 kg)  02/13/22 195 lb (88.5 kg)     GEN: Lean, fit appears younger than stated age. Well nourished, well developed in no acute distress. Healthy L subclavian ICD site. HEENT:  Normal NECK: No JVD; No carotid bruits LYMPHATICS: No lymphadenopathy CARDIAC: RRR, no murmurs, rubs, gallops RESPIRATORY:  Clear to auscultation without rales, wheezing or rhonchi  ABDOMEN: Soft, non-tender, non-distended MUSCULOSKELETAL:  No edema; No deformity  SKIN: Warm and dry NEUROLOGIC:  Alert and oriented x 3 PSYCHIATRIC:  Normal affect   ASSESSMENT:    1. 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 Coronary artery disease of bypass graft of native heart with stable angina pectoris (HCC)   3. Paroxysmal atrial fibrillation (HCC)   4. Acquired thrombophilia (HCC)   5. Biventricular automatic implantable cardioverter defibrillator in situ   6. Hypercholesterolemia   7. OSA on CPAP    PLAN:    In order of problems listed above:  CHF: Despite this extensive history of ischemic heart disease is MRI only shows limited scar in his low EF is primarily due to desynchrony.  Had an excellent rebound and EF with CRT with most recent LVEF 50-55%.  NYHA functional class I, euvolemic, very active.  Not requiring diuretics.  On maximum tolerated doses of Entresto  and metoprolol  as well as Farxiga .  At this point he is not on spironolactone. CAD: Despite being very physically active he does not have angina pectoris, with a low-dose of metoprolol  as his only antianginal medication.  Not on aspirin  due to full anticoagulation.  Extensive native artery and graft disease with multiple stents placed in different coronary distribution and residual stenosis in the distal LAD artery beyond the bypass insertion.   AFib: Continues have very low prevalence of arrhythmia under 1%, but with episodes lasting for several hours., CHA2DS2-VASc score 4 (age, CAD, CHF, HTN). Anticoagulation: On Eliquis  without bleeding problems. CRT-D: Implanted and followed by Dr. Danelle Birmingham.  Most recent remote download reviewed the function of the episode.  97% resynchronization  efficiency.  No evidence of hypervolemia by heart failure score.  Lower rate limit for tachy arrhythmia therapy is 190 bpm.  He does not even approach this during intense activity. NSVT is very rare. HLP: On Praluent  and Vascepa .  History of statin myopathy.  Recheck lipid profile. GERD: Well-controlled on chronic PPI. OSA: Reports plans with CPAP and denies daytime hypersomnolence.           Medication Adjustments/Labs and Tests Ordered: Current medicines are reviewed at length with the patient today.  Concerns regarding medicines are outlined above.  Orders Placed This Encounter  Procedures   Lipid panel   CBC   Comprehensive metabolic panel   EKG 12-Lead   No orders of the defined types were placed in this encounter.   Patient Instructions  Medication Instructions:  No changes today. *If you need a refill on your cardiac medications before your next appointment, please call your pharmacy*   Lab Work: Fasting Lipid , CBC, CMET anytime. If you have labs (blood work) drawn today and your tests are completely normal,  you will receive your results only by: MyChart Message (if you have MyChart) OR A paper copy in the mail If you have any lab test that is abnormal or we need to change your treatment, we will call you to review the results.   Follow-Up: At [facility], you and your health needs are our priority.  As part of our continuing mission to provide you with exceptional heart care, we have created designated Provider Care Teams.  These Care Teams include your primary Cardiologist (physician) and Advanced Practice Providers (APPs -  Physician Assistants and Nurse Practitioners) who all work together to provide you with the care you need, when you need it.  We recommend signing up for the patient portal called MyChart.  Sign up information is provided on this After Visit Summary.  MyChart is used to connect with patients for Virtual Visits (Telemedicine).  Patients  are able to view lab/test results, encounter notes, upcoming appointments, etc.  Non-urgent messages can be sent to your provider as well.   To learn more about what you can do with MyChart, go to [email or web address].    Your next appointment:   1 year(s)  Provider:   Jerel Balding, MD     Other Instructions           Signed, Jerel Balding, MD  02/14/2023 8:51 AM    [facility]

## 2023-02-20 LAB — CBC

## 2023-02-21 ENCOUNTER — Encounter: Payer: Self-pay | Admitting: Cardiovascular Disease

## 2023-02-21 LAB — CBC
Hematocrit: 46.4 % (ref 37.5–51.0)
Hemoglobin: 15.2 g/dL (ref 13.0–17.7)
MCH: 30 pg (ref 26.6–33.0)
MCHC: 32.8 g/dL (ref 31.5–35.7)
MCV: 92 fL (ref 79–97)
Platelets: 313 10*3/uL (ref 150–450)
RBC: 5.06 x10E6/uL (ref 4.14–5.80)
RDW: 12.2 % (ref 11.6–15.4)
WBC: 6.2 10*3/uL (ref 3.4–10.8)

## 2023-02-21 LAB — COMPREHENSIVE METABOLIC PANEL
ALT: 21 [IU]/L (ref 0–44)
AST: 19 [IU]/L (ref 0–40)
Albumin: 4.5 g/dL (ref 3.8–4.8)
Alkaline Phosphatase: 77 [IU]/L (ref 44–121)
BUN/Creatinine Ratio: 18 (ref 10–24)
BUN: 19 mg/dL (ref 8–27)
Bilirubin Total: 0.5 mg/dL (ref 0.0–1.2)
CO2: 24 mmol/L (ref 20–29)
Calcium: 9.4 mg/dL (ref 8.6–10.2)
Chloride: 101 mmol/L (ref 96–106)
Creatinine, Ser: 1.05 mg/dL (ref 0.76–1.27)
Globulin, Total: 2.2 g/dL (ref 1.5–4.5)
Glucose: 107 mg/dL — ABNORMAL HIGH (ref 70–99)
Potassium: 4.4 mmol/L (ref 3.5–5.2)
Sodium: 142 mmol/L (ref 134–144)
Total Protein: 6.7 g/dL (ref 6.0–8.5)
eGFR: 75 mL/min/{1.73_m2} (ref >59)

## 2023-02-21 LAB — LIPID PANEL
Chol/HDL Ratio: 2.5 {ratio} (ref 0.0–5.0)
Cholesterol, Total: 114 mg/dL (ref 100–199)
HDL: 46 mg/dL (ref >39)
LDL Chol Calc (NIH): 47 mg/dL (ref 0–99)
Triglycerides: 118 mg/dL (ref 0–149)
VLDL Cholesterol Cal: 21 mg/dL (ref 5–40)

## 2023-02-27 NOTE — Addendum Note (Signed)
Addended by: Elease Etienne A on: 02/27/2023 02:57 PM   Modules accepted: Orders

## 2023-02-27 NOTE — Progress Notes (Signed)
 Remote ICD transmission.

## 2023-03-26 ENCOUNTER — Telehealth: Payer: Self-pay | Admitting: Pharmacist

## 2023-03-26 MED ORDER — PRALUENT 75 MG/ML ~~LOC~~ SOAJ: 75.0000 mg | SUBCUTANEOUS | 3 refills | Status: DC

## 2023-03-26 NOTE — Telephone Encounter (Signed)
 Patient called in to request refill on Praluent. Prescription sent to preferred [facility]. Last LDL 47 mg/dl - [id number]

## 2023-03-28 ENCOUNTER — Telehealth: Payer: Self-pay

## 2023-03-28 ENCOUNTER — Other Ambulatory Visit (HOSPITAL_COMMUNITY): Payer: Self-pay

## 2023-03-28 NOTE — Telephone Encounter (Signed)
 Pharmacy Patient Advocate Encounter   Received notification from CoverMyMeds that prior authorization for PRALUENT is required/requested.   Insurance verification completed.   The patient is insured through [facility] ADVANTAGE/RX ADVANCE .   Per test claim: PA required; PA submitted to above mentioned insurance via CoverMyMeds Key/confirmation #/EOC [facility] Status is pending

## 2023-03-29 ENCOUNTER — Other Ambulatory Visit (HOSPITAL_COMMUNITY): Payer: Self-pay

## 2023-03-29 ENCOUNTER — Other Ambulatory Visit: Payer: Self-pay | Admitting: Cardiovascular Disease

## 2023-03-29 NOTE — Telephone Encounter (Signed)
 Pharmacy Patient Advocate Encounter  Received notification from [facility] ADVANTAGE/RX ADVANCE that Prior Authorization for PRALUENT has been APPROVED from 03/29/23 to 03/28/24. Ran test claim, Copay is $0. This test claim was processed through [facility]- copay amounts may vary at other pharmacies due to pharmacy/plan contracts, or as the patient moves through the different stages of their insurance plan.

## 2023-04-14 ENCOUNTER — Other Ambulatory Visit: Payer: Self-pay | Admitting: Internal Medicine

## 2023-04-18 ENCOUNTER — Other Ambulatory Visit: Payer: Self-pay | Admitting: Cardiovascular Disease

## 2023-04-19 ENCOUNTER — Ambulatory Visit (INDEPENDENT_AMBULATORY_CARE_PROVIDER_SITE_OTHER): Payer: Medicare Other

## 2023-04-19 DIAGNOSIS — I255 Ischemic cardiomyopathy: Secondary | ICD-10-CM | POA: Diagnosis not present

## 2023-04-23 LAB — CUP PACEART REMOTE DEVICE CHECK
Battery Remaining Longevity: 78 mo
Battery Remaining Percentage: 91 %
Brady Statistic RA Percent Paced: 2 %
Brady Statistic RV Percent Paced: 11 %
Date Time Interrogation Session: 20250412164700
HighPow Impedance: 83 Ohm
Lead Channel Impedance Value: 472 Ohm
Lead Channel Impedance Value: 599 Ohm
Lead Channel Impedance Value: 627 Ohm
Lead Channel Setting Pacing Amplitude: 1.5 V
Lead Channel Setting Pacing Amplitude: 2 V
Lead Channel Setting Pacing Amplitude: 2 V
Lead Channel Setting Pacing Pulse Width: 0.4 ms
Lead Channel Setting Pacing Pulse Width: 0.4 ms
Lead Channel Setting Sensing Sensitivity: 0.6 mV
Lead Channel Setting Sensing Sensitivity: 1 mV
Pulse Gen Serial Number: 169965

## 2023-04-24 ENCOUNTER — Encounter: Payer: Self-pay | Admitting: Internal Medicine

## 2023-05-22 ENCOUNTER — Other Ambulatory Visit: Payer: Self-pay | Admitting: Internal Medicine

## 2023-05-22 NOTE — Telephone Encounter (Signed)
 Prescription refill request for Eliquis  received. Indication:afib Last office visit:2/25 Scr:1.05  2/25 Age: 73 Weight:90.7  kg  Prescription refilled

## 2023-05-28 NOTE — Progress Notes (Signed)
 Remote ICD transmission.

## 2023-05-28 NOTE — Addendum Note (Signed)
 Addended by: Lott Rouleau A on: 05/28/2023 01:10 PM   Modules accepted: Orders

## 2023-07-19 ENCOUNTER — Ambulatory Visit (INDEPENDENT_AMBULATORY_CARE_PROVIDER_SITE_OTHER): Payer: Medicare Other

## 2023-07-19 DIAGNOSIS — I255 Ischemic cardiomyopathy: Secondary | ICD-10-CM | POA: Diagnosis not present

## 2023-07-20 LAB — CUP PACEART REMOTE DEVICE CHECK
Battery Remaining Longevity: 78 mo
Battery Remaining Percentage: 86 %
Brady Statistic RA Percent Paced: 2 %
Brady Statistic RV Percent Paced: 11 %
Date Time Interrogation Session: 20250710044100
HighPow Impedance: 79 Ohm
Lead Channel Impedance Value: 443 Ohm
Lead Channel Impedance Value: 599 Ohm
Lead Channel Impedance Value: 615 Ohm
Lead Channel Setting Pacing Amplitude: 1.5 V
Lead Channel Setting Pacing Amplitude: 2 V
Lead Channel Setting Pacing Amplitude: 2 V
Lead Channel Setting Pacing Pulse Width: 0.4 ms
Lead Channel Setting Pacing Pulse Width: 0.4 ms
Lead Channel Setting Sensing Sensitivity: 0.6 mV
Lead Channel Setting Sensing Sensitivity: 1 mV
Pulse Gen Serial Number: 169965

## 2023-07-23 ENCOUNTER — Ambulatory Visit: Payer: Self-pay | Admitting: Internal Medicine

## 2023-08-11 ENCOUNTER — Other Ambulatory Visit: Payer: Self-pay | Admitting: Cardiovascular Disease

## 2023-08-16 ENCOUNTER — Ambulatory Visit: Attending: Internal Medicine | Admitting: Internal Medicine

## 2023-08-16 ENCOUNTER — Encounter: Payer: Self-pay | Admitting: Internal Medicine

## 2023-08-16 VITALS — BP 132/70 | HR 59 | Ht 72.0 in | Wt 197.0 lb

## 2023-08-16 DIAGNOSIS — I5022 Chronic systolic (congestive) heart failure: Secondary | ICD-10-CM

## 2023-08-16 NOTE — Progress Notes (Signed)
 HPI Tommy [name] returns today for followup. He has extensive CAD, s/p remote MI, severe LV dysfunction, LBBB, s/p biv ICD insertion. He has done well in the interim. No chest pain or wosening sob. No ICD shocks. No palpitations. His EF normalized. He is still running regularly. He notes that he does not tolerated the heat as well as he used to. Allergies  Allergen Reactions   Fish Oil  Other (See Comments)    FEELS BAD      Current Outpatient Medications  Medication Sig Dispense Refill   Alirocumab  (PRALUENT ) 75 MG/ML SOAJ INJECT 1 ML (75 MG TOTAL) INTO THE SKIN EVERY 14 (FOURTEEN) DAYS. 6 mL 1   apixaban  (ELIQUIS ) 5 MG TABS tablet Take 1 tablet (5 mg total) by mouth 2 (two) times daily. 14 tablet 0   apixaban  (ELIQUIS ) 5 MG TABS tablet TAKE 1 TABLET BY MOUTH TWICE A DAY 60 tablet 5   cholecalciferol  (VITAMIN D3) 25 MCG (1000 UT) tablet Take 1,000 Units by mouth daily.     dapagliflozin  propanediol (FARXIGA ) 10 MG TABS tablet Take 1 tablet (10 mg total) by mouth daily before breakfast. 30 tablet 11   ergocalciferol  (VITAMIN D2) 1.25 MG (50000 UT) capsule Take 1 capsule by mouth daily.     finasteride  (PROPECIA ) 1 MG tablet Take 1 mg by mouth daily.     icosapent  Ethyl (VASCEPA ) 1 g capsule TAKE 2 CAPSULES BY MOUTH TWICE A DAY 360 capsule 2   metoprolol  succinate (TOPROL -XL) 25 MG 24 hr tablet TAKE 1 TABLET BY MOUTH EVERY DAY WITH OR IMMEDIATELY FOLLOWING A MEAL 90 tablet 0   omeprazole  (PRILOSEC) 40 MG capsule TAKE 1 CAPSULE BY MOUTH EVERY DAY 90 capsule 3   prednisoLONE acetate (PRED FORTE) 1 % ophthalmic suspension Place 1 drop into the left eye every other day.     sacubitril -valsartan  (ENTRESTO ) 49-51 MG TAKE 1 TABLET BY MOUTH TWICE A DAY 180 tablet 3   sodium chloride  (MURO 128) 2 % ophthalmic solution Place 1 drop into the left eye 3 (three) times daily.     tadalafil (CIALIS) 20 MG tablet Take by mouth as needed.     No current facility-administered medications for this  visit.     Past Medical History:  Diagnosis Date   AICD (automatic cardioverter/defibrillator) present    BIV   Anginal pain (HCC)    Blood transfusion without reported diagnosis    CAD (coronary artery disease)    with CABG LIMA to LAD, free radial PDA, SVG to diagonal, SVG to OM. 2002. DES May 2011 and 01-2010 to SVG of Diag, native OM, and Native RCA. Unable to stent LAD via LIMA10/18 PCI to dRCA, multi-site DES to SVG--> OM   GERD (gastroesophageal reflux disease)    Hyperlipidemia    unable to tolarate statin therapy   Hypertension    Kidney stone    OSA on CPAP 12/13/2017   Severe obstructive sleep apnea with an AHI of 30.3/h and no significant central sleep apnea.  His oxygen saturations dropped to 83%.  He is on CPAP at 8 cm H2O.   Sleep apnea    Vertigo     ROS:   All systems reviewed and negative except as noted in the HPI.   Past Surgical History:  Procedure Laterality Date   BIV ICD INSERTION CRT-D N/A 07/11/2016   Procedure: BiV ICD Insertion CRT-D;  Surgeon: Waddell Danelle ORN, MD;  Location: [facility] INVASIVE CV LAB;  Service: Cardiovascular;  Laterality: N/A;   CHOLECYSTECTOMY     COLONOSCOPY     CORONARY ARTERY BYPASS GRAFT     CORONARY BALLOON ANGIOPLASTY N/A 10/12/2016   Procedure: CORONARY BALLOON ANGIOPLASTY;  Surgeon: Claudene Victory ORN, MD;  Location: [facility] INVASIVE CV LAB;  Service: Cardiovascular;  Laterality: N/A;   CORONARY STENT INTERVENTION  10/12/2016   PTCA of focal in-stent restenosis in the second obtuse marginal reducing 90% stenosis to 0%.   CORONARY STENT INTERVENTION N/A 10/12/2016   Procedure: CORONARY STENT INTERVENTION;  Surgeon: Claudene Victory ORN, MD;  Location: [facility] INVASIVE CV LAB;  Service: Cardiovascular;  Laterality: N/A;   LEFT HEART CATH AND CORS/GRAFTS ANGIOGRAPHY N/A 10/12/2016   Procedure: LEFT HEART CATH AND CORS/GRAFTS ANGIOGRAPHY;  Surgeon: Claudene Victory ORN, MD;  Location: [facility] INVASIVE CV LAB;  Service: Cardiovascular;  Laterality: N/A;   LEFT HEART  CATH AND CORS/GRAFTS ANGIOGRAPHY N/A 01/08/2018   Procedure: LEFT HEART CATH AND CORS/GRAFTS ANGIOGRAPHY;  Surgeon: Claudene Victory ORN, MD;  Location: [facility] INVASIVE CV LAB;  Service: Cardiovascular;  Laterality: N/A;   RIGHT/LEFT HEART CATH AND CORONARY/GRAFT ANGIOGRAPHY N/A 03/01/2016   Procedure: Right/Left Heart Cath and Coronary/Graft Angiography;  Surgeon: Victory ORN Claudene, MD;  Location: [facility] INVASIVE CV LAB;  Service: Cardiovascular;  Laterality: N/A;   ULTRASOUND GUIDANCE FOR VASCULAR ACCESS  01/08/2018   Procedure: Ultrasound Guidance For Vascular Access;  Surgeon: Claudene Victory ORN, MD;  Location: [facility] INVASIVE CV LAB;  Service: Cardiovascular;;     Family History  Problem Relation Age of Onset   Colon cancer Father    Sudden death Neg Hx    Hypertension Neg Hx    Hyperlipidemia Neg Hx    Heart attack Neg Hx    Diabetes Neg Hx    Rectal cancer Neg Hx    Stomach cancer Neg Hx    Esophageal cancer Neg Hx    Ulcerative colitis Neg Hx      Social History   Socioeconomic History   Marital status: Married    Spouse name: Not on file   Number of children: Not on file   Years of education: Not on file   Highest education level: Not on file  Occupational History   Not on file  Tobacco Use   Smoking status: Never   Smokeless tobacco: Never  Vaping Use   Vaping status: Never Used  Substance and Sexual Activity   Alcohol use: Yes    Alcohol/week: 1.0 standard drink of alcohol    Types: 1 Glasses of wine per week    Comment: RARE   Drug use: No   Sexual activity: Not on file  Other Topics Concern   Not on file  Social History Narrative   Not on file   Social Drivers of Health   Financial Resource Strain: Not on file  Food Insecurity: Low Risk  (08/16/2022)   Received from [facility]   Hunger Vital Sign    Within the past 12 months, you worried that your food would run out before you got money to buy more: Never true    Within the past 12 months, the food you bought just didn't  last and you didn't have money to get more. : Never true  Transportation Needs: No Transportation Needs (08/16/2022)   Received from [employer]    In the past 12 months, has lack of reliable transportation kept you from medical appointments, meetings, work or from getting things needed for daily living? : No  Physical Activity: Not on file  Stress: Not on file  Social Connections: Not on file  Intimate Partner Violence: Not on file     BP 132/70   Pulse (!) 59   Ht 6' (1.829 m)   Wt 197 lb (89.4 kg)   SpO2 97%   BMI 26.72 kg/m   Physical Exam:  Well appearing NAD HEENT: Unremarkable Neck:  No JVD, no thyromegally Lymphatics:  No adenopathy Back:  No CVA tenderness Lungs:  Clear HEART:  Regular rate rhythm, no murmurs, no rubs, no clicks Abd:  soft, positive bowel sounds, no organomegally, no rebound, no guarding Ext:  2 plus pulses, no edema, no cyanosis, no clubbing Skin:  No rashes no nodules Neuro:  CN II through XII intact, motor grossly intact   DEVICE  Normal device function.  See PaceArt for details.   Assess/Plan:  1. CAD - he remains active exercising almost every day and he has had not had any angina. 2. PAF - he has been asymptomatic. He has been started on systemic anti-coagulation and no bleeding. 3. Dyslipidemia - he has been intolerant to statins but has been able to tolerate his Alirocumab .  4. ICD - his [place] Biv ICD is working normally. We will recheck in several months.    Danelle Waddell HERO.D

## 2023-08-16 NOTE — Patient Instructions (Signed)
 Medication Instructions:  Your physician recommends that you continue on your current medications as directed. Please refer to the Current Medication list given to you today.  *If you need a refill on your cardiac medications before your next appointment, please call your pharmacy*  Lab Work: None ordered.  You may go to any [facility] Location for your lab work:  [facility] - [address] ([facility]) - [address] [phone] [address]  [place] - [address]   [place]  - [address]   [place] - [address] - [address] [employer]  - [address] [place] - [address] ([facility]   If you have labs (blood work) drawn today and your tests are completely normal, you will receive your results only by: [employer] (if you have MyChart)  If you have any lab test that is abnormal or we need to change your treatment, we will call you or send a MyChart message to review the results.  Testing/Procedures: None ordered.  Follow-Up: At [facility], you and your health needs are our priority.  As part of our continuing mission to provide you with exceptional heart care, we have created designated Provider Care Teams.  These Care Teams include your primary Cardiologist (physician) and Advanced Practice Providers (APPs -  Physician Assistants and Nurse Practitioners) who all work together to provide you with the care you need, when you need it.  Your next appointment:   1 year(s)  The format for your next appointment:   In Person  Provider:   Donnice Primus, MD or one of the following Advanced Practice Providers on your designated Care Team:   Charlies Arthur, [place] Ozell Jodie Passey, [place] Leotis Barrack, NP  Note: Remote monitoring is used to monitor your Pacemaker/ ICD from home. This monitoring reduces the number of office visits required to check  your device to one time per year. It allows us  to keep an eye on the functioning of your device to ensure it is working properly.

## 2023-09-21 ENCOUNTER — Other Ambulatory Visit: Payer: Self-pay

## 2023-09-21 MED ORDER — METOPROLOL SUCCINATE ER 25 MG PO TB24: ORAL_TABLET | ORAL | 3 refills | Status: AC

## 2023-10-18 ENCOUNTER — Ambulatory Visit (INDEPENDENT_AMBULATORY_CARE_PROVIDER_SITE_OTHER): Payer: Medicare Other

## 2023-10-18 DIAGNOSIS — I5022 Chronic systolic (congestive) heart failure: Secondary | ICD-10-CM | POA: Diagnosis not present

## 2023-10-18 NOTE — Progress Notes (Signed)
 Remote ICD Transmission

## 2023-10-22 LAB — CUP PACEART REMOTE DEVICE CHECK
Battery Remaining Longevity: 78 mo
Battery Remaining Percentage: 87 %
Brady Statistic RA Percent Paced: 4 %
Brady Statistic RV Percent Paced: 31 %
Date Time Interrogation Session: 20251011144500
HighPow Impedance: 82 Ohm
Lead Channel Impedance Value: 441 Ohm
Lead Channel Impedance Value: 610 Ohm
Lead Channel Impedance Value: 622 Ohm
Lead Channel Setting Pacing Amplitude: 1.5 V
Lead Channel Setting Pacing Amplitude: 2 V
Lead Channel Setting Pacing Amplitude: 2 V
Lead Channel Setting Pacing Pulse Width: 0.4 ms
Lead Channel Setting Pacing Pulse Width: 0.4 ms
Lead Channel Setting Sensing Sensitivity: 0.6 mV
Lead Channel Setting Sensing Sensitivity: 1 mV
Pulse Gen Serial Number: 169965

## 2023-10-23 NOTE — Progress Notes (Signed)
 Remote ICD Transmission

## 2023-10-24 ENCOUNTER — Other Ambulatory Visit: Payer: Self-pay | Admitting: Cardiovascular Disease

## 2023-10-25 ENCOUNTER — Telehealth: Payer: Self-pay | Admitting: Cardiovascular Disease

## 2023-10-25 ENCOUNTER — Ambulatory Visit: Payer: Self-pay | Admitting: Internal Medicine

## 2023-10-25 MED ORDER — DAPAGLIFLOZIN PROPANEDIOL 10 MG PO TABS: 10.0000 mg | ORAL_TABLET | Freq: Every day | ORAL | 1 refills | Status: AC

## 2023-10-25 NOTE — Telephone Encounter (Signed)
 Patient stated he wants to get a new CPAP unit and wants a call back to discuss next steps.

## 2023-10-25 NOTE — Telephone Encounter (Signed)
 Pt's medication was sent to pt's pharmacy as requested. Confirmation received.

## 2023-10-25 NOTE — Telephone Encounter (Signed)
*  STAT* If patient is at the pharmacy, call can be transferred to refill team.   1. Which medications need to be refilled? (please list name of each medication and dose if known)   dapagliflozin  propanediol (FARXIGA ) 10 MG TABS tablet   2. Would you like to learn more about the convenience, safety, & potential cost savings by using the [facility]?   3. Are you open to using the [facility] (Type [facility]. ).  4. Which pharmacy/location (including street and city if local pharmacy) is medication to be sent to?  [id number] - [place], [place] - [address]   5. Do they need a 30 day or 90 day supply?   90 day  Patient stated he is completely out of this medication.

## 2023-10-30 ENCOUNTER — Telehealth: Payer: Self-pay

## 2023-10-30 NOTE — Telephone Encounter (Signed)
 Patient has an appointment for Wednesday with Dr Shlomo.

## 2023-10-30 NOTE — Progress Notes (Unsigned)
 Sleep Medicine CONSULT Note    Date:  10/31/2023   ID:  Tommy [name], DOB 11/25/1950, MRN [id number]  PCP:  Patient, No Pcp Per  Cardiologist: Jerel Balding, MD   Chief Complaint  Patient presents with   New Patient (Initial Visit)    OSA    History of Present Illness:  Tommy [name] is a 73 y.o. male who is being seen today for the evaluation of OSA at the request of Jerel Balding, MD.  This is a 73yo male with a hx of ASCAD, HTN and HLD.  He also has a hx of OSA.  He initially presented with c/o sleep walking, snoring and witnessed apnea and was found to have severe OSA with an AHi of 30/hr and O2 sat nadir of 83%.  He underwent CPAP titration to 8cm H2O.  He was last seen by me in 2019 and was doing wel with his CPAP but then was lost to followup.  He is now referred for Sleep Medicine consult to reestablish sleep care    He is doing well with his PAP device and thinks that he has gotten used to it.  He tolerates the full face mask and feels the pressure is adequate.  For the most part he feels rested in the am but does get up to use the bathroom at night.  He has no significant daytime sleepiness. He denies any significant mouth or nasal dryness or nasal congestion.  He does not think that he snores.   Past Medical History:  Diagnosis Date   AICD (automatic cardioverter/defibrillator) present    BIV   Anginal pain    Blood transfusion without reported diagnosis    CAD (coronary artery disease)    with CABG LIMA to LAD, free radial PDA, SVG to diagonal, SVG to OM. 2002. DES May 2011 and 01-2010 to SVG of Diag, native OM, and Native RCA. Unable to stent LAD via LIMA10/18 PCI to dRCA, multi-site DES to SVG--> OM   GERD (gastroesophageal reflux disease)    Hyperlipidemia    unable to tolarate statin therapy   Hypertension    Kidney stone    OSA on CPAP 12/13/2017   Severe obstructive sleep apnea with an AHI of 30.3/h and no significant central sleep apnea.  His oxygen  saturations dropped to 83%.  He is on CPAP at 8 cm H2O.   Sleep apnea    Vertigo     Past Surgical History:  Procedure Laterality Date   BIV ICD INSERTION CRT-D N/A 07/11/2016   Procedure: BiV ICD Insertion CRT-D;  Surgeon: Waddell Danelle ORN, MD;  Location: [facility] INVASIVE CV LAB;  Service: Cardiovascular;  Laterality: N/A;   CHOLECYSTECTOMY     COLONOSCOPY     CORONARY ARTERY BYPASS GRAFT     CORONARY BALLOON ANGIOPLASTY N/A 10/12/2016   Procedure: CORONARY BALLOON ANGIOPLASTY;  Surgeon: Claudene Victory ORN, MD;  Location: [facility] INVASIVE CV LAB;  Service: Cardiovascular;  Laterality: N/A;   CORONARY STENT INTERVENTION  10/12/2016   PTCA of focal in-stent restenosis in the second obtuse marginal reducing 90% stenosis to 0%.   CORONARY STENT INTERVENTION N/A 10/12/2016   Procedure: CORONARY STENT INTERVENTION;  Surgeon: Claudene Victory ORN, MD;  Location: [facility] INVASIVE CV LAB;  Service: Cardiovascular;  Laterality: N/A;   LEFT HEART CATH AND CORS/GRAFTS ANGIOGRAPHY N/A 10/12/2016   Procedure: LEFT HEART CATH AND CORS/GRAFTS ANGIOGRAPHY;  Surgeon: Claudene Victory ORN, MD;  Location: [facility] INVASIVE CV LAB;  Service: Cardiovascular;  Laterality: N/A;   LEFT HEART CATH AND CORS/GRAFTS ANGIOGRAPHY N/A 01/08/2018   Procedure: LEFT HEART CATH AND CORS/GRAFTS ANGIOGRAPHY;  Surgeon: Claudene Victory ORN, MD;  Location: [facility] INVASIVE CV LAB;  Service: Cardiovascular;  Laterality: N/A;   RIGHT/LEFT HEART CATH AND CORONARY/GRAFT ANGIOGRAPHY N/A 03/01/2016   Procedure: Right/Left Heart Cath and Coronary/Graft Angiography;  Surgeon: Victory ORN Claudene, MD;  Location: [facility] INVASIVE CV LAB;  Service: Cardiovascular;  Laterality: N/A;   ULTRASOUND GUIDANCE FOR VASCULAR ACCESS  01/08/2018   Procedure: Ultrasound Guidance For Vascular Access;  Surgeon: Claudene Victory ORN, MD;  Location: [facility] INVASIVE CV LAB;  Service: Cardiovascular;;    Current Medications: Current Meds  Medication Sig   Alirocumab  (PRALUENT ) 75 MG/ML SOAJ INJECT 1 ML (75 MG TOTAL) INTO THE  SKIN EVERY 14 (FOURTEEN) DAYS.   apixaban  (ELIQUIS ) 5 MG TABS tablet Take 1 tablet (5 mg total) by mouth 2 (two) times daily.   apixaban  (ELIQUIS ) 5 MG TABS tablet TAKE 1 TABLET BY MOUTH TWICE A DAY   cholecalciferol  (VITAMIN D3) 25 MCG (1000 UT) tablet Take 1,000 Units by mouth daily.   dapagliflozin  propanediol (FARXIGA ) 10 MG TABS tablet Take 1 tablet (10 mg total) by mouth daily before breakfast.   finasteride  (PROPECIA ) 1 MG tablet Take 1 mg by mouth daily.   gatifloxacin (ZYMAXID) 0.5 % SOLN Place 1 drop into the right eye 3 (three) times daily.   icosapent  Ethyl (VASCEPA ) 1 g capsule TAKE 2 CAPSULES BY MOUTH TWICE A DAY   metoprolol  succinate (TOPROL -XL) 25 MG 24 hr tablet TAKE 1 TABLET BY MOUTH EVERY DAY WITH OR IMMEDIATELY FOLLOWING A MEAL   omeprazole  (PRILOSEC) 40 MG capsule TAKE 1 CAPSULE BY MOUTH EVERY DAY   prednisoLONE acetate (PRED FORTE) 1 % ophthalmic suspension Place 1 drop into the left eye every other day.   sacubitril -valsartan  (ENTRESTO ) 49-51 MG TAKE 1 TABLET BY MOUTH TWICE A DAY   tadalafil (CIALIS) 20 MG tablet Take by mouth as needed.    Allergies:   Fish oil    Social History   Socioeconomic History   Marital status: Married    Spouse name: Not on file   Number of children: Not on file   Years of education: Not on file   Highest education level: Not on file  Occupational History   Not on file  Tobacco Use   Smoking status: Never   Smokeless tobacco: Never  Vaping Use   Vaping status: Never Used  Substance and Sexual Activity   Alcohol use: Yes    Alcohol/week: 1.0 standard drink of alcohol    Types: 1 Glasses of wine per week    Comment: RARE   Drug use: No   Sexual activity: Not on file  Other Topics Concern   Not on file  Social History Narrative   Not on file   Social Drivers of Health   Financial Resource Strain: Not on file  Food Insecurity: Low Risk  (08/16/2022)   Received from [facility]   Hunger Vital Sign    Within the past 12  months, you worried that your food would run out before you got money to buy more: Never true    Within the past 12 months, the food you bought just didn't last and you didn't have money to get more. : Never true  Transportation Needs: No Transportation Needs (08/16/2022)   Received from [employer]    In the past 12 months, has  lack of reliable transportation kept you from medical appointments, meetings, work or from getting things needed for daily living? : No  Physical Activity: Not on file  Stress: Not on file  Social Connections: Not on file     Family History:  The patient's family history includes Colon cancer in his father.   ROS:   Please see the history of present illness.    ROS All other systems reviewed and are negative.      No data to display             PHYSICAL EXAM:   VS:  BP 120/68 (BP Location: Left Arm, Patient Position: Sitting, Cuff Size: Normal)   Pulse 65   Resp 16   Ht 6' (1.829 m)   Wt 190 lb (86.2 kg)   SpO2 97%   BMI 25.77 kg/m    GEN: Well nourished, well developed, in no acute distress  HEENT: normal  Neck: no JVD, carotid bruits, or masses Cardiac: RRR; no murmurs, rubs, or gallops,no edema.  Intact distal pulses bilaterally.  Respiratory:  clear to auscultation bilaterally, normal work of breathing GI: soft, nontender, nondistended, + BS MS: no deformity or atrophy  Skin: warm and dry, no rash Neuro:  Alert and Oriented x 3, Strength and sensation are intact Psych: euthymic mood, full affect  Wt Readings from Last 3 Encounters:  10/31/23 190 lb (86.2 kg)  08/16/23 197 lb (89.4 kg)  02/14/23 200 lb (90.7 kg)      Studies/Labs Reviewed:   PAP compliance download  Recent Labs: 02/20/2023: ALT 21; BUN 19; Creatinine, Ser 1.05; Hemoglobin 15.2; Platelets 313; Potassium 4.4; Sodium 142   ASSESSMENT:    1. OSA (obstructive sleep apnea)   2. Benign essential HTN      PLAN:  In order of problems listed  above:  OSA - The patient is tolerating PAP therapy well without any problems. The PAP download performed by his DME was personally reviewed and interpreted by me today and showed an AHI of 0.3/hr on 9 cm H2O with 100% compliance in using more than 4 hours nightly.  The patient has been using and benefiting from PAP use and will continue to benefit from therapy -his device is over 18 years old and would like a new device so I will order him a new ResMed CPAP at 9cm H2O with heated humidity and mask of choice  HTN -BP controlled on exam today -continue Toprol  XL 25mg  daily and Entresto  49-51mg  BID with PRN refills   Time Spent: 20 minutes total time of encounter, including 15 minutes spent in face-to-face patient care on the date of this encounter. This time includes coordination of care and counseling regarding above mentioned problem list. Remainder of non-face-to-face time involved reviewing chart documents/testing relevant to the patient encounter and documentation in the medical record. I have independently reviewed documentation from referring provider  Medication Adjustments/Labs and Tests Ordered: Current medicines are reviewed at length with the patient today.  Concerns regarding medicines are outlined above.  Medication changes, Labs and Tests ordered today are listed in the Patient Instructions below.  There are no Patient Instructions on file for this visit.   Signed, Wilbert Bihari, MD  10/31/2023 9:34 AM    [facility] [address] [place], [place], [place]  [address] Phone: [phone]; Fax: [phone]

## 2023-10-30 NOTE — Telephone Encounter (Signed)
 Tommy [name]

## 2023-10-31 ENCOUNTER — Ambulatory Visit: Attending: Cardiology | Admitting: Cardiology

## 2023-10-31 ENCOUNTER — Encounter: Payer: Self-pay | Admitting: Cardiology

## 2023-10-31 VITALS — BP 120/68 | HR 65 | Resp 16 | Ht 72.0 in | Wt 190.0 lb

## 2023-10-31 DIAGNOSIS — G4733 Obstructive sleep apnea (adult) (pediatric): Secondary | ICD-10-CM | POA: Diagnosis not present

## 2023-10-31 DIAGNOSIS — I10 Essential (primary) hypertension: Secondary | ICD-10-CM | POA: Diagnosis not present

## 2023-10-31 NOTE — Patient Instructions (Signed)
 Medication Instructions:  The current medical regimen is effective;  continue present plan and medications.  *If you need a refill on your cardiac medications before your next appointment, please call your pharmacy*   Follow-Up: At [facility], you and your health needs are our priority.  As part of our continuing mission to provide you with exceptional heart care, our providers are all part of one team.  This team includes your primary Cardiologist (physician) and Advanced Practice Providers or APPs (Physician Assistants and Nurse Practitioners) who all work together to provide you with the care you need, when you need it.  Your next appointment:   As instructed   We recommend signing up for the patient portal called MyChart.  Sign up information is provided on this After Visit Summary.  MyChart is used to connect with patients for Virtual Visits (Telemedicine).  Patients are able to view lab/test results, encounter notes, upcoming appointments, etc.  Non-urgent messages can be sent to your provider as well.   To learn more about what you can do with MyChart, go to [email or web address].

## 2023-11-01 ENCOUNTER — Telehealth: Payer: Self-pay | Admitting: *Deleted

## 2023-11-01 DIAGNOSIS — I25708 Atherosclerosis of coronary artery bypass graft(s), unspecified, with other forms of angina pectoris: Secondary | ICD-10-CM

## 2023-11-01 DIAGNOSIS — I5022 Chronic systolic (congestive) heart failure: Secondary | ICD-10-CM

## 2023-11-01 DIAGNOSIS — G4733 Obstructive sleep apnea (adult) (pediatric): Secondary | ICD-10-CM

## 2023-11-01 DIAGNOSIS — I10 Essential (primary) hypertension: Secondary | ICD-10-CM

## 2023-11-01 NOTE — Telephone Encounter (Signed)
Order placed to [place] via community message

## 2023-11-01 NOTE — Telephone Encounter (Signed)
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

## 2023-12-11 ENCOUNTER — Other Ambulatory Visit: Payer: Self-pay | Admitting: Internal Medicine

## 2023-12-11 MED ORDER — APIXABAN 5 MG PO TABS: 5.0000 mg | ORAL_TABLET | Freq: Two times a day (BID) | ORAL | 5 refills | Status: AC

## 2023-12-11 NOTE — Telephone Encounter (Signed)
 Prescription refill request for Eliquis  received. Indication:afib Last office visit:10/25 Scr: 1.04  10/25 Age:73 Weight:86.2  kg  Prescription refilled

## 2024-01-13 ENCOUNTER — Encounter: Payer: Self-pay | Admitting: Cardiovascular Disease

## 2024-01-17 ENCOUNTER — Encounter

## 2024-01-24 ENCOUNTER — Ambulatory Visit: Attending: Cardiology

## 2024-01-24 DIAGNOSIS — I5022 Chronic systolic (congestive) heart failure: Secondary | ICD-10-CM

## 2024-01-25 ENCOUNTER — Ambulatory Visit: Payer: Self-pay | Admitting: Cardiology

## 2024-01-25 LAB — CUP PACEART REMOTE DEVICE CHECK
Battery Remaining Longevity: 72 mo
Battery Remaining Percentage: 84 %
Brady Statistic RA Percent Paced: 3 %
Brady Statistic RV Percent Paced: 19 %
Date Time Interrogation Session: 20260115205400
HighPow Impedance: 68 Ohm
Lead Channel Impedance Value: 377 Ohm
Lead Channel Impedance Value: 511 Ohm
Lead Channel Impedance Value: 569 Ohm
Lead Channel Setting Pacing Amplitude: 1.5 V
Lead Channel Setting Pacing Amplitude: 2 V
Lead Channel Setting Pacing Amplitude: 2 V
Lead Channel Setting Pacing Pulse Width: 0.4 ms
Lead Channel Setting Pacing Pulse Width: 0.4 ms
Lead Channel Setting Sensing Sensitivity: 0.6 mV
Lead Channel Setting Sensing Sensitivity: 1 mV
Pulse Gen Serial Number: 169965

## 2024-01-31 NOTE — Progress Notes (Signed)
 Remote ICD Transmission

## 2024-02-22 ENCOUNTER — Ambulatory Visit: Admitting: Emergency Medicine

## 2024-04-17 ENCOUNTER — Encounter

## 2024-04-24 ENCOUNTER — Ambulatory Visit

## 2024-07-17 ENCOUNTER — Encounter

## 2024-07-24 ENCOUNTER — Ambulatory Visit

## 2024-10-16 ENCOUNTER — Encounter

## 2024-10-23 ENCOUNTER — Ambulatory Visit

## 2025-01-15 ENCOUNTER — Encounter

## 2025-01-22 ENCOUNTER — Ambulatory Visit

## 2025-04-16 ENCOUNTER — Encounter
